# Patient Record
Sex: Female | Born: 1950
Health system: Southern US, Community
[De-identification: ages and names within clinical notes are randomized; demographics above are authoritative.]

## PROBLEM LIST (undated history)

## (undated) DIAGNOSIS — M1990 Unspecified osteoarthritis, unspecified site: Secondary | ICD-10-CM

## (undated) DIAGNOSIS — Z9889 Other specified postprocedural states: Secondary | ICD-10-CM

## (undated) DIAGNOSIS — R112 Nausea with vomiting, unspecified: Secondary | ICD-10-CM

## (undated) HISTORY — PX: BREAST MASS EXCISION: SHX1267

## (undated) HISTORY — PX: CARTILAGE SURGERY: SHX1303

---
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
## 1998-03-27 ENCOUNTER — Encounter: Admission: RE | Admit: 1998-03-27 | Discharge: 1998-03-27 | Payer: Self-pay | Admitting: Family Medicine

## 2000-03-30 ENCOUNTER — Encounter: Payer: Self-pay | Admitting: Cardiology

## 2000-03-30 ENCOUNTER — Ambulatory Visit (HOSPITAL_COMMUNITY): Admission: RE | Admit: 2000-03-30 | Discharge: 2000-03-30 | Payer: Self-pay | Admitting: Cardiology

## 2001-11-15 ENCOUNTER — Ambulatory Visit (HOSPITAL_COMMUNITY): Admission: RE | Admit: 2001-11-15 | Discharge: 2001-11-15 | Payer: Self-pay | Admitting: *Deleted

## 2002-11-17 ENCOUNTER — Ambulatory Visit (HOSPITAL_COMMUNITY): Admission: RE | Admit: 2002-11-17 | Discharge: 2002-11-17 | Payer: Self-pay | Admitting: Family Medicine

## 2003-06-08 ENCOUNTER — Encounter: Admission: RE | Admit: 2003-06-08 | Discharge: 2003-06-08 | Payer: Self-pay | Admitting: Obstetrics and Gynecology

## 2003-06-15 ENCOUNTER — Encounter: Admission: RE | Admit: 2003-06-15 | Discharge: 2003-06-15 | Payer: Self-pay | Admitting: Obstetrics and Gynecology

## 2004-03-04 ENCOUNTER — Ambulatory Visit (HOSPITAL_COMMUNITY): Admission: RE | Admit: 2004-03-04 | Discharge: 2004-03-04 | Payer: Self-pay | Admitting: Obstetrics & Gynecology

## 2005-03-24 ENCOUNTER — Ambulatory Visit (HOSPITAL_COMMUNITY): Admission: RE | Admit: 2005-03-24 | Discharge: 2005-03-24 | Payer: Self-pay | Admitting: Obstetrics and Gynecology

## 2006-04-06 ENCOUNTER — Ambulatory Visit (HOSPITAL_COMMUNITY): Admission: RE | Admit: 2006-04-06 | Discharge: 2006-04-06 | Payer: Self-pay | Admitting: Obstetrics & Gynecology

## 2006-07-30 ENCOUNTER — Ambulatory Visit: Payer: Self-pay | Admitting: Obstetrics and Gynecology

## 2007-04-07 ENCOUNTER — Ambulatory Visit (HOSPITAL_COMMUNITY): Admission: RE | Admit: 2007-04-07 | Discharge: 2007-04-07 | Payer: Self-pay

## 2008-04-07 ENCOUNTER — Ambulatory Visit (HOSPITAL_COMMUNITY): Admission: RE | Admit: 2008-04-07 | Discharge: 2008-04-07 | Payer: Self-pay | Admitting: Obstetrics & Gynecology

## 2010-05-24 NOTE — Group Therapy Note (Signed)
NAMEKYLEENA, SCHEIRER NO.:  [id number]   MEDICAL RECORD NO.:  [id number]                   PATIENT TYPE:  OUT   LOCATION:  [facility]                           FACILITY:  [facility]   PHYSICIAN:  Argentina Donovan, MD                     DATE OF BIRTH:  01/12/1950   DATE OF SERVICE:  06/08/2003                                    CLINIC NOTE   REASON FOR VISIT:  The patient is a 60 year old gravida 3 para 2-0-1-2 whose  youngest child is 67 years old and has had problems with uterine prolapse  and pelvic pressure for 15 years.  Over the last 9-12 months the symptoms  have become much worse with back pain and bilateral groin increasing  discomfort and also has caused some dyspareunia.  She comes in and on  examination she has a third degree procidentiae with third degree uterine  prolapse, cysto- and rectocele, and an inverted, funnel-shaped vagina which  a cube pessary was attempted to be used before without success.  In reducing  the prolapse, in pushing it up into the vagina, we could feel a clear area  around the uterus which I think may be benefited with a Gellhorn pessary so  we have tried to insert a 3-inch pessary and try and see if this will  control the patient's symptoms until she can arrange with her work to be  able to have the surgery.  Will also order her an inflatable cube pessary to  see whether that is more beneficial to her lifestyle.  The patient needs a  vaginal hysterectomy with an anterior and posterior colporrhaphy and a  sacral spinous ligament suspension of the vaginal vault at the minium.  She  is a slender lady, weighing 127 pounds and is 5 feet 5 inches tall.  Otherwise, seems to be in good health.   DIAGNOSIS:  Complete pelvic procidentiae and addition of Gellhorn 3-inch  pessary.                                               Argentina Donovan, MD    PR/MEDQ  D:  06/08/2003  T:  06/09/2003  Job:  [id number]

## 2010-05-24 NOTE — Group Therapy Note (Signed)
NAMESAPHYRA, HUTT NO.:  [id number]   MEDICAL RECORD NO.:  [id number]                   PATIENT TYPE:  OUT   LOCATION:  [facility]                           FACILITY:  [facility]   PHYSICIAN:  Argentina Donovan, MD                     DATE OF BIRTH:  06-09-1950   DATE OF SERVICE:  06/16/2003                                    CLINIC NOTE   REASON FOR VISIT:  A 60 year old postmenopausal white female gravida 3, para  2-0-1-2 who has a complete procidentia with a uterine prolapse and  cystorectocele.  We used an 3 inch pessary a Gellhorn which worked  Agricultural consultant at holding everything up but was somewhat difficult to remove.  I have instructed her husband in its use and have given her a 2-3/4 inch  Gellhorn pessary.  We tried to consider the inflatable; however, they do not  make that in anything but LATEX and the lady is LATEX SENSITIVE.  We have  given her warning signs of discharge that was abnormal, etc., to see whether  or not she needed to have anything further done.  In view of future surgery,  we will wait and see how the patient does on the pessary.                                               Argentina Donovan, MD    PR/MEDQ  D:  06/15/2003  T:  06/16/2003  Job:  [id number]

## 2012-06-04 ENCOUNTER — Other Ambulatory Visit (HOSPITAL_COMMUNITY): Payer: Self-pay | Admitting: Orthopaedic Surgery

## 2012-06-14 ENCOUNTER — Encounter (HOSPITAL_COMMUNITY): Payer: Self-pay | Admitting: Pharmacy Technician

## 2012-06-15 ENCOUNTER — Encounter (HOSPITAL_COMMUNITY): Payer: Self-pay

## 2012-06-15 ENCOUNTER — Encounter (HOSPITAL_COMMUNITY)
Admission: RE | Admit: 2012-06-15 | Discharge: 2012-06-15 | Disposition: A | Discharge status: Home / Self Care | Payer: BC Managed Care – PPO | Source: Ambulatory Visit | Attending: Orthopaedic Surgery | Admitting: Orthopaedic Surgery

## 2012-06-15 HISTORY — DX: Unspecified osteoarthritis, unspecified site: M19.90

## 2012-06-15 LAB — CBC
MCH: 31.1 pg (ref 26.0–34.0)
MCHC: 35 g/dL (ref 30.0–36.0)
MCV: 88.9 fL (ref 78.0–100.0)
Platelets: 256 10*3/uL (ref 150–400)
RBC: 4.5 MIL/uL (ref 3.87–5.11)

## 2012-06-15 LAB — BASIC METABOLIC PANEL
BUN: 15 mg/dL (ref 6–23)
CO2: 26 mEq/L (ref 19–32)
Calcium: 8.9 mg/dL (ref 8.4–10.5)
Creatinine, Ser: 0.69 mg/dL (ref 0.50–1.10)
GFR calc non Af Amer: >90 mL/min (ref >90)
Glucose, Bld: 96 mg/dL (ref 70–99)
Sodium: 139 mEq/L (ref 135–145)

## 2012-06-15 LAB — ABO/RH: ABO/RH(D): B POS

## 2012-06-15 NOTE — Pre-Procedure Instructions (Addendum)
Tara Fernandez  06/15/2012   Your procedure is scheduled on: Thursday, June 17, 2012  Report to [facility] at 8:45 AM.  Call this number if you have problems the morning of surgery: [phone]   Remember:   Do not eat food or drink liquids after midnight.   Take these medicines the morning of surgery with A SIP OF WATER: traMADol (ULTRAM) 50 MG tablet Stop taking Aspirin  and herbal medications (EVENING PRIMROSE OIL, Glucosamine HCl (GLUCOSAMINE PO) Do not take any NSAIDs ie: Ibuprofen, Advil, Naproxen or any medication containing Aspirin (meloxicam (MOBIC) 7.5 MG tablet)             Do not wear jewelry, make-up or nail polish.  Do not wear lotions, powders, or perfumes. You may wear deodorant.  Do not shave 48 hours prior to surgery. Men may shave face and neck.  Do not bring valuables to the hospital.  [facility] is not responsible   for any belongings or valuables.  Contacts, dentures or bridgework may not be worn into surgery.  Leave suitcase in the car. After surgery it may be brought to your room.  For patients admitted to the hospital, checkout time is 11:00 AM the day of discharge.   Patients discharged the day of surgery will not be allowed to drive home.  Name and phone number of your driver:   Special Instructions: Shower using CHG 2 nights before surgery and the night before surgery.  If you shower the day of surgery use CHG.  Use special wash - you have one bottle of CHG for all showers.  You should use approximately 1/3 of the bottle for each shower.   Please read over the following fact sheets that you were given: Pain Booklet, Coughing and Deep Breathing, Blood Transfusion Information, MRSA Information and Surgical Site Infection Prevention

## 2012-06-15 NOTE — Progress Notes (Addendum)
Pt denies SOB, chest pain, and being under the care of a cardiologist. Pt states that she wants the anesthesiologist to be aware that she does not want a Spinal. After several unsuccessful attempts, pt unable to void; need urinalysis with reflex on DOS.

## 2012-06-16 MED ORDER — CEFAZOLIN SODIUM-DEXTROSE 2-3 GM-% IV SOLR
2.0000 g | INTRAVENOUS | Status: AC
  Administered 2012-06-17: 2 g via INTRAVENOUS
  Filled 2012-06-16: qty 50

## 2012-06-17 ENCOUNTER — Encounter (HOSPITAL_COMMUNITY): Payer: Self-pay | Admitting: *Deleted

## 2012-06-17 ENCOUNTER — Inpatient Hospital Stay (HOSPITAL_COMMUNITY): Payer: BC Managed Care – PPO

## 2012-06-17 ENCOUNTER — Inpatient Hospital Stay (HOSPITAL_COMMUNITY)
Admission: RE | Admit: 2012-06-17 | Discharge: 2012-06-20 | DRG: 818 | Disposition: A | Discharge status: Home Health | Payer: BC Managed Care – PPO | Source: Ambulatory Visit | Attending: Orthopaedic Surgery | Admitting: Orthopaedic Surgery

## 2012-06-17 ENCOUNTER — Ambulatory Visit (HOSPITAL_COMMUNITY): Payer: BC Managed Care – PPO | Admitting: *Deleted

## 2012-06-17 ENCOUNTER — Ambulatory Visit (HOSPITAL_COMMUNITY): Payer: BC Managed Care – PPO

## 2012-06-17 ENCOUNTER — Encounter (HOSPITAL_COMMUNITY)
Admission: RE | Disposition: A | Discharge status: Home Health | Payer: Self-pay | Source: Ambulatory Visit | Attending: Orthopaedic Surgery

## 2012-06-17 DIAGNOSIS — M169 Osteoarthritis of hip, unspecified: Secondary | ICD-10-CM

## 2012-06-17 DIAGNOSIS — M1610 Unilateral primary osteoarthritis, unspecified hip: Principal | ICD-10-CM | POA: Diagnosis present

## 2012-06-17 DIAGNOSIS — Z79899 Other long term (current) drug therapy: Secondary | ICD-10-CM

## 2012-06-17 HISTORY — PX: TOTAL HIP ARTHROPLASTY: SHX124

## 2012-06-17 LAB — URINALYSIS, ROUTINE W REFLEX MICROSCOPIC
Bilirubin Urine: NEGATIVE
Glucose, UA: NEGATIVE mg/dL
Nitrite: NEGATIVE
Specific Gravity, Urine: 1.015 (ref 1.005–1.030)
pH: 5.5 (ref 5.0–8.0)

## 2012-06-17 LAB — URINE MICROSCOPIC-ADD ON

## 2012-06-17 SURGERY — ARTHROPLASTY, HIP, TOTAL, ANTERIOR APPROACH
Anesthesia: General | Site: Hip | Laterality: Left | Wound class: Clean

## 2012-06-17 MED ORDER — ARTIFICIAL TEARS OP OINT
TOPICAL_OINTMENT | OPHTHALMIC | Status: DC | PRN
  Administered 2012-06-17: 1 via OPHTHALMIC

## 2012-06-17 MED ORDER — ONDANSETRON HCL 4 MG/2ML IJ SOLN
INTRAMUSCULAR | Status: AC
  Filled 2012-06-17: qty 2

## 2012-06-17 MED ORDER — HYDROMORPHONE HCL PF 1 MG/ML IJ SOLN
INTRAMUSCULAR | Status: AC
  Filled 2012-06-17: qty 2

## 2012-06-17 MED ORDER — MENTHOL 3 MG MT LOZG
1.0000 | LOZENGE | OROMUCOSAL | Status: DC | PRN
  Filled 2012-06-17 (×2): qty 9

## 2012-06-17 MED ORDER — ONDANSETRON HCL 4 MG/2ML IJ SOLN
4.0000 mg | Freq: Once | INTRAMUSCULAR | Status: AC | PRN
  Administered 2012-06-17: 4 mg via INTRAVENOUS

## 2012-06-17 MED ORDER — CEFAZOLIN SODIUM 1-5 GM-% IV SOLN
1.0000 g | Freq: Four times a day (QID) | INTRAVENOUS | Status: AC
  Administered 2012-06-17 (×2): 1 g via INTRAVENOUS
  Filled 2012-06-17 (×2): qty 50

## 2012-06-17 MED ORDER — LACTATED RINGERS IV SOLN
INTRAVENOUS | Status: DC
  Administered 2012-06-17: 50 mL/h via INTRAVENOUS

## 2012-06-17 MED ORDER — ONDANSETRON HCL 4 MG PO TABS: 4.0000 mg | ORAL_TABLET | Freq: Four times a day (QID) | ORAL | Status: DC | PRN

## 2012-06-17 MED ORDER — ACETAMINOPHEN 325 MG PO TABS
650.0000 mg | ORAL_TABLET | Freq: Four times a day (QID) | ORAL | Status: DC | PRN
  Administered 2012-06-19: 650 mg via ORAL
  Filled 2012-06-17 (×2): qty 2

## 2012-06-17 MED ORDER — ACETAMINOPHEN 650 MG RE SUPP: 650.0000 mg | Freq: Four times a day (QID) | RECTAL | Status: DC | PRN

## 2012-06-17 MED ORDER — EPHEDRINE SULFATE 50 MG/ML IJ SOLN
INTRAMUSCULAR | Status: DC | PRN
  Administered 2012-06-17: 5 mg via INTRAVENOUS

## 2012-06-17 MED ORDER — HYDROMORPHONE HCL PF 1 MG/ML IJ SOLN
1.0000 mg | INTRAMUSCULAR | Status: DC | PRN
  Administered 2012-06-17 – 2012-06-19 (×5): 1 mg via INTRAVENOUS
  Filled 2012-06-17 (×6): qty 1

## 2012-06-17 MED ORDER — FENTANYL CITRATE 0.05 MG/ML IJ SOLN
INTRAMUSCULAR | Status: DC | PRN
  Administered 2012-06-17: 50 ug via INTRAVENOUS
  Administered 2012-06-17: 100 ug via INTRAVENOUS
  Administered 2012-06-17 (×2): 50 ug via INTRAVENOUS
  Administered 2012-06-17: 150 ug via INTRAVENOUS
  Administered 2012-06-17 (×2): 50 ug via INTRAVENOUS

## 2012-06-17 MED ORDER — PROPOFOL 10 MG/ML IV BOLUS
INTRAVENOUS | Status: DC | PRN
  Administered 2012-06-17: 120 mg via INTRAVENOUS

## 2012-06-17 MED ORDER — SODIUM CHLORIDE 0.9 % IR SOLN
Status: DC | PRN
  Administered 2012-06-17: 3000 mL

## 2012-06-17 MED ORDER — STERILE WATER FOR IRRIGATION IR SOLN
Status: DC | PRN
  Administered 2012-06-17: 1000 mL

## 2012-06-17 MED ORDER — BISACODYL 5 MG PO TBEC
5.0000 mg | DELAYED_RELEASE_TABLET | Freq: Every day | ORAL | Status: DC | PRN
  Administered 2012-06-19 – 2012-06-20 (×2): 5 mg via ORAL
  Filled 2012-06-17 (×2): qty 1

## 2012-06-17 MED ORDER — ALUM & MAG HYDROXIDE-SIMETH 200-200-20 MG/5ML PO SUSP: 30.0000 mL | ORAL | Status: DC | PRN

## 2012-06-17 MED ORDER — DOCUSATE SODIUM 100 MG PO CAPS
100.0000 mg | ORAL_CAPSULE | Freq: Two times a day (BID) | ORAL | Status: DC
  Administered 2012-06-17 – 2012-06-20 (×6): 100 mg via ORAL
  Filled 2012-06-17 (×8): qty 1

## 2012-06-17 MED ORDER — METOCLOPRAMIDE HCL 5 MG/ML IJ SOLN
5.0000 mg | Freq: Three times a day (TID) | INTRAMUSCULAR | Status: DC | PRN
  Administered 2012-06-17: 10 mg via INTRAVENOUS
  Filled 2012-06-17: qty 2

## 2012-06-17 MED ORDER — OXYCODONE HCL 5 MG PO TABS
5.0000 mg | ORAL_TABLET | ORAL | Status: DC | PRN
  Administered 2012-06-17: 5 mg via ORAL
  Administered 2012-06-17 – 2012-06-20 (×17): 10 mg via ORAL
  Filled 2012-06-17 (×17): qty 2
  Filled 2012-06-17: qty 1

## 2012-06-17 MED ORDER — PHENYLEPHRINE HCL 10 MG/ML IJ SOLN
INTRAMUSCULAR | Status: DC | PRN
  Administered 2012-06-17: 40 ug via INTRAVENOUS
  Administered 2012-06-17 (×3): 80 ug via INTRAVENOUS

## 2012-06-17 MED ORDER — ROCURONIUM BROMIDE 100 MG/10ML IV SOLN
INTRAVENOUS | Status: DC | PRN
  Administered 2012-06-17: 50 mg via INTRAVENOUS

## 2012-06-17 MED ORDER — NEOSTIGMINE METHYLSULFATE 1 MG/ML IJ SOLN
INTRAMUSCULAR | Status: DC | PRN
  Administered 2012-06-17: 3 mg via INTRAVENOUS

## 2012-06-17 MED ORDER — ZOLPIDEM TARTRATE 5 MG PO TABS: 5.0000 mg | ORAL_TABLET | Freq: Every evening | ORAL | Status: DC | PRN

## 2012-06-17 MED ORDER — METOCLOPRAMIDE HCL 10 MG PO TABS: 5.0000 mg | ORAL_TABLET | Freq: Three times a day (TID) | ORAL | Status: DC | PRN

## 2012-06-17 MED ORDER — PHENOL 1.4 % MT LIQD
1.0000 | OROMUCOSAL | Status: DC | PRN
  Administered 2012-06-18: 1 via OROMUCOSAL
  Filled 2012-06-17: qty 177

## 2012-06-17 MED ORDER — HYDROMORPHONE HCL PF 1 MG/ML IJ SOLN
0.2500 mg | INTRAMUSCULAR | Status: DC | PRN
  Administered 2012-06-17 (×6): 0.5 mg via INTRAVENOUS

## 2012-06-17 MED ORDER — LIDOCAINE HCL (CARDIAC) 20 MG/ML IV SOLN
INTRAVENOUS | Status: DC | PRN
  Administered 2012-06-17: 100 mg via INTRAVENOUS

## 2012-06-17 MED ORDER — ONDANSETRON HCL 4 MG/2ML IJ SOLN
INTRAMUSCULAR | Status: DC | PRN
  Administered 2012-06-17: 4 mg via INTRAVENOUS

## 2012-06-17 MED ORDER — GLYCOPYRROLATE 0.2 MG/ML IJ SOLN
INTRAMUSCULAR | Status: DC | PRN
  Administered 2012-06-17: .4 mg via INTRAVENOUS

## 2012-06-17 MED ORDER — MEPERIDINE HCL 25 MG/ML IJ SOLN: 6.2500 mg | INTRAMUSCULAR | Status: DC | PRN

## 2012-06-17 MED ORDER — SODIUM CHLORIDE 0.9 % IV SOLN
INTRAVENOUS | Status: DC
  Administered 2012-06-17 – 2012-06-19 (×2): via INTRAVENOUS

## 2012-06-17 MED ORDER — HYDROMORPHONE HCL PF 1 MG/ML IJ SOLN
INTRAMUSCULAR | Status: AC
  Filled 2012-06-17: qty 1

## 2012-06-17 MED ORDER — 0.9 % SODIUM CHLORIDE (POUR BTL) OPTIME
TOPICAL | Status: DC | PRN
  Administered 2012-06-17: 1000 mL

## 2012-06-17 MED ORDER — OXYCODONE HCL 5 MG/5ML PO SOLN: 5.0000 mg | Freq: Once | ORAL | Status: DC | PRN

## 2012-06-17 MED ORDER — METHOCARBAMOL 100 MG/ML IJ SOLN
500.0000 mg | Freq: Four times a day (QID) | INTRAVENOUS | Status: DC | PRN
  Filled 2012-06-17: qty 5

## 2012-06-17 MED ORDER — METHOCARBAMOL 500 MG PO TABS
500.0000 mg | ORAL_TABLET | Freq: Four times a day (QID) | ORAL | Status: DC | PRN
  Administered 2012-06-18 – 2012-06-20 (×9): 500 mg via ORAL
  Filled 2012-06-17 (×9): qty 1

## 2012-06-17 MED ORDER — LACTATED RINGERS IV SOLN
INTRAVENOUS | Status: DC | PRN
  Administered 2012-06-17 (×2): via INTRAVENOUS

## 2012-06-17 MED ORDER — DEXAMETHASONE SODIUM PHOSPHATE 4 MG/ML IJ SOLN
INTRAMUSCULAR | Status: DC | PRN
  Administered 2012-06-17: 4 mg via INTRAVENOUS

## 2012-06-17 MED ORDER — RIVAROXABAN 10 MG PO TABS
10.0000 mg | ORAL_TABLET | Freq: Every day | ORAL | Status: DC
  Administered 2012-06-18 – 2012-06-20 (×3): 10 mg via ORAL
  Filled 2012-06-17 (×5): qty 1

## 2012-06-17 MED ORDER — OXYCODONE HCL 5 MG PO TABS: 5.0000 mg | ORAL_TABLET | Freq: Once | ORAL | Status: DC | PRN

## 2012-06-17 MED ORDER — MIDAZOLAM HCL 5 MG/5ML IJ SOLN
INTRAMUSCULAR | Status: DC | PRN
  Administered 2012-06-17: 2 mg via INTRAVENOUS

## 2012-06-17 MED ORDER — POLYETHYLENE GLYCOL 3350 17 G PO PACK: 17.0000 g | PACK | Freq: Every day | ORAL | Status: DC | PRN

## 2012-06-17 MED ORDER — ONDANSETRON HCL 4 MG/2ML IJ SOLN
4.0000 mg | Freq: Four times a day (QID) | INTRAMUSCULAR | Status: DC | PRN
  Administered 2012-06-17: 4 mg via INTRAVENOUS
  Filled 2012-06-17: qty 2

## 2012-06-17 MED ORDER — DIPHENHYDRAMINE HCL 12.5 MG/5ML PO ELIX: 12.5000 mg | ORAL_SOLUTION | ORAL | Status: DC | PRN

## 2012-06-17 SURGICAL SUPPLY — 59 items
BANDAGE GAUZE ELAST BULKY 4 IN (GAUZE/BANDAGES/DRESSINGS) IMPLANT
BLADE SAW SGTL 18X1.27X75 (BLADE) ×2 IMPLANT
BLADE SURG ROTATE 9660 (MISCELLANEOUS) IMPLANT
CAPT HIP PF COP ×2 IMPLANT
CELLS DAT CNTRL 66122 CELL SVR (MISCELLANEOUS) ×1 IMPLANT
CLOTH BEACON ORANGE TIMEOUT ST (SAFETY) ×2 IMPLANT
COVER BACK TABLE 24X17X13 BIG (DRAPES) IMPLANT
COVER SURGICAL LIGHT HANDLE (MISCELLANEOUS) ×2 IMPLANT
DERMABOND ADHESIVE PROPEN (GAUZE/BANDAGES/DRESSINGS) ×1
DERMABOND ADVANCED (GAUZE/BANDAGES/DRESSINGS) ×1
DERMABOND ADVANCED .7 DNX12 (GAUZE/BANDAGES/DRESSINGS) ×1 IMPLANT
DERMABOND ADVANCED .7 DNX6 (GAUZE/BANDAGES/DRESSINGS) ×1 IMPLANT
DRAPE C-ARM 42X72 X-RAY (DRAPES) ×2 IMPLANT
DRAPE STERI IOBAN 125X83 (DRAPES) ×2 IMPLANT
DRAPE U-SHAPE 47X51 STRL (DRAPES) ×6 IMPLANT
DRSG AQUACEL AG ADV 3.5X10 (GAUZE/BANDAGES/DRESSINGS) ×2 IMPLANT
DURAPREP 26ML APPLICATOR (WOUND CARE) ×2 IMPLANT
ELECT BLADE 4.0 EZ CLEAN MEGAD (MISCELLANEOUS)
ELECT BLADE TIP CTD 4 INCH (ELECTRODE) ×2 IMPLANT
ELECT CAUTERY BLADE 6.4 (BLADE) ×2 IMPLANT
ELECT REM PT RETURN 9FT ADLT (ELECTROSURGICAL) ×2
ELECTRODE BLDE 4.0 EZ CLN MEGD (MISCELLANEOUS) IMPLANT
ELECTRODE REM PT RTRN 9FT ADLT (ELECTROSURGICAL) ×1 IMPLANT
FACESHIELD LNG OPTICON STERILE (SAFETY) ×4 IMPLANT
FLUID NSS /IRRIG 3000 ML XXX (IV SOLUTION) ×2 IMPLANT
GAUZE XEROFORM 1X8 LF (GAUZE/BANDAGES/DRESSINGS) ×2 IMPLANT
GLOVE BIOGEL PI IND STRL 8 (GLOVE) ×2 IMPLANT
GLOVE BIOGEL PI INDICATOR 8 (GLOVE) ×2
GLOVE ECLIPSE 8.0 STRL XLNG CF (GLOVE) ×2 IMPLANT
GLOVE SURG ORTHO 8.0 STRL STRW (GLOVE) ×2 IMPLANT
GLOVE SURG SS PI 7.5 STRL IVOR (GLOVE) ×2 IMPLANT
GLOVE SURG SS PI 8.0 STRL IVOR (GLOVE) ×2 IMPLANT
GOWN STRL REIN XL XLG (GOWN DISPOSABLE) ×4 IMPLANT
HANDPIECE INTERPULSE COAX TIP (DISPOSABLE) ×1
KIT BASIN OR (CUSTOM PROCEDURE TRAY) ×2 IMPLANT
KIT ROOM TURNOVER OR (KITS) ×2 IMPLANT
MANIFOLD NEPTUNE II (INSTRUMENTS) ×2 IMPLANT
NS IRRIG 1000ML POUR BTL (IV SOLUTION) ×2 IMPLANT
PACK TOTAL JOINT (CUSTOM PROCEDURE TRAY) ×2 IMPLANT
PAD ARMBOARD 7.5X6 YLW CONV (MISCELLANEOUS) ×4 IMPLANT
RTRCTR WOUND ALEXIS 18CM MED (MISCELLANEOUS) ×2
SET HNDPC FAN SPRY TIP SCT (DISPOSABLE) ×1 IMPLANT
SPONGE LAP 18X18 X RAY DECT (DISPOSABLE) IMPLANT
SPONGE LAP 4X18 X RAY DECT (DISPOSABLE) IMPLANT
STAPLER VISISTAT 35W (STAPLE) ×2 IMPLANT
SUT ETHIBOND NAB CT1 #1 30IN (SUTURE) ×4 IMPLANT
SUT MNCRL AB 3-0 PS2 27 (SUTURE) ×2 IMPLANT
SUT MNCRL AB 4-0 PS2 18 (SUTURE) ×2 IMPLANT
SUT VIC AB 0 CT1 27 (SUTURE) ×2
SUT VIC AB 0 CT1 27XBRD ANBCTR (SUTURE) ×2 IMPLANT
SUT VIC AB 1 CT1 27 (SUTURE) ×2
SUT VIC AB 1 CT1 27XBRD ANBCTR (SUTURE) ×2 IMPLANT
SUT VIC AB 2-0 CT1 27 (SUTURE) ×2
SUT VIC AB 2-0 CT1 TAPERPNT 27 (SUTURE) ×2 IMPLANT
TOWEL OR 17X24 6PK STRL BLUE (TOWEL DISPOSABLE) ×2 IMPLANT
TOWEL OR 17X26 10 PK STRL BLUE (TOWEL DISPOSABLE) ×2 IMPLANT
TRAY FOLEY BAG SILVER LF 14FR (CATHETERS) ×2 IMPLANT
TRAY FOLEY CATH 14FR (SET/KITS/TRAYS/PACK) IMPLANT
WATER STERILE IRR 1000ML POUR (IV SOLUTION) ×2 IMPLANT

## 2012-06-17 NOTE — H&P (Signed)
TOTAL HIP ADMISSION H&P  Patient is admitted for left total hip arthroplasty.  Subjective:  Chief Complaint: left hip pain  HPI: Tara Fernandez, 62 y.o. female, has a history of pain and functional disability in the left hip(s) due to arthritis and patient has failed non-surgical conservative treatments for greater than 12 weeks to include NSAID's and/or analgesics, corticosteriod injections, use of assistive devices and activity modification.  Onset of symptoms was gradual starting 2 years ago with rapidlly worsening course since that time.The patient noted no past surgery on the left hip(s).  Patient currently rates pain in the left hip at 8 out of 10 with activity. Patient has night pain, worsening of pain with activity and weight bearing, trendelenberg gait, pain that interfers with activities of daily living, pain with passive range of motion and crepitus. Patient has evidence of subchondral cysts, subchondral sclerosis, periarticular osteophytes and joint space narrowing by imaging studies. This condition presents safety issues increasing the risk of falls.  There is no current active infection.  Patient Active Problem List   Diagnosis Date Noted  . Degenerative arthritis of hip 06/17/2012   Past Medical History  Diagnosis Date  . Arthritis     osteoarthritis    Past Surgical History  Procedure Laterality Date  . Breast mass excision      Hx: of left breast    Prescriptions prior to admission  Medication Sig Dispense Refill  . Cholecalciferol (VITAMIN D PO) Take 10,000 Units by mouth daily.      Marland Kitchen EVENING PRIMROSE OIL PO Take 1,300 mg by mouth daily.      . Glucosamine HCl (GLUCOSAMINE PO) Take 1 tablet by mouth daily.      Marland Kitchen MAGNESIUM PO Take 1 tablet by mouth daily.      . meloxicam (MOBIC) 7.5 MG tablet Take 7.5 mg by mouth 2 (two) times daily as needed for pain.      . Nutritional Supplements (DHEA PO) Take 1 tablet by mouth daily.      Marland Kitchen OVER THE COUNTER MEDICATION Take 1  tablet by mouth daily. Med: positive energy      . OVER THE COUNTER MEDICATION Apply 1 application topically every other day. Over the counter progesterone cream      . Potassium 99 MG TABS Take 99 mg by mouth daily.       Allergies  Allergen Reactions  . Tramadol Other (See Comments)    Causes numbness in hands and right side of body  . Etodolac Other (See Comments)    "it bothered my heart and made my left arm go numb."    History  Substance Use Topics  . Smoking status: Never Smoker   . Smokeless tobacco: Never Used  . Alcohol Use: Not on file     Comment: occasional    Family History  Problem Relation Age of Onset  . Heart attack Father   . Cancer - Lung Sister      Review of Systems  Musculoskeletal: Positive for joint pain.  All other systems reviewed and are negative.    Objective:  Physical Exam  Constitutional: She is oriented to person, place, and time. She appears well-developed and well-nourished.  HENT:  Head: Normocephalic and atraumatic.  Eyes: EOM are normal. Pupils are equal, round, and reactive to light.  Neck: Normal range of motion. Neck supple.  Cardiovascular: Normal rate and regular rhythm.   Respiratory: Effort normal and breath sounds normal.  GI: Soft. Bowel sounds are  normal.  Musculoskeletal:       Left hip: She exhibits decreased range of motion, decreased strength, bony tenderness and crepitus.  Neurological: She is alert and oriented to person, place, and time.  Skin: Skin is warm and dry.  Psychiatric: She has a normal mood and affect.    Vital signs in last 24 hours: Temp:  [98.2 F (36.8 C)] 98.2 F (36.8 C) (06/12 0756) Pulse Rate:  [70] 70 (06/12 0756) Resp:  [16] 16 (06/12 0756) BP: (103)/(61) 103/61 mmHg (06/12 0756) SpO2:  [99 %] 99 % (06/12 0756)  Labs:   There is no weight on file to calculate BMI.   Imaging Review Plain radiographs demonstrate severe degenerative joint disease of the left hip(s). The bone  quality appears to be good for age and reported activity level.  Assessment/Plan:  End stage arthritis, left hip(s)  The patient history, physical examination, clinical judgement of the provider and imaging studies are consistent with end stage degenerative joint disease of the left hip(s) and total hip arthroplasty is deemed medically necessary. The treatment options including medical management, injection therapy, arthroscopy and arthroplasty were discussed at length. The risks and benefits of total hip arthroplasty were presented and reviewed. The risks due to aseptic loosening, infection, stiffness, dislocation/subluxation,  thromboembolic complications and other imponderables were discussed.  The patient acknowledged the explanation, agreed to proceed with the plan and consent was signed. Patient is being admitted for inpatient treatment for surgery, pain control, PT, OT, prophylactic antibiotics, VTE prophylaxis, progressive ambulation and ADL's and discharge planning.The patient is planning to be discharged home with home health services

## 2012-06-17 NOTE — Anesthesia Preprocedure Evaluation (Signed)
Anesthesia Evaluation  Patient identified by MRN, date of birth, ID band Patient awake    Reviewed: Allergy & Precautions, H&P , NPO status , Patient's Chart, lab work & pertinent test results  Airway Mallampati: I TM Distance: >3 FB Neck ROM: Full    Dental   Pulmonary          Cardiovascular     Neuro/Psych    GI/Hepatic   Endo/Other    Renal/GU      Musculoskeletal   Abdominal   Peds  Hematology   Anesthesia Other Findings   Reproductive/Obstetrics                           Anesthesia Physical Anesthesia Plan  ASA: II  Anesthesia Plan: General   Post-op Pain Management:    Induction: Intravenous  Airway Management Planned: Oral ETT  Additional Equipment:   Intra-op Plan:   Post-operative Plan: Extubation in OR  Informed Consent: I have reviewed the patients History and Physical, chart, labs and discussed the procedure including the risks, benefits and alternatives for the proposed anesthesia with the patient or authorized representative who has indicated his/her understanding and acceptance.     Plan Discussed with: CRNA and Surgeon  Anesthesia Plan Comments:         Anesthesia Quick Evaluation  

## 2012-06-17 NOTE — Brief Op Note (Signed)
06/17/2012  11:31 AM  PATIENT:  Mittie Bodo  62 y.o. female  PRE-OPERATIVE DIAGNOSIS:  Severe osteoarthritis left hip  POST-OPERATIVE DIAGNOSIS:  severe osteoarthritis left hip  PROCEDURE:  Procedure(s): LEFT TOTAL HIP ARTHROPLASTY ANTERIOR APPROACH (Left)  SURGEON:  Surgeon(s) and Role:    * Kathryne Hitch, MD - Primary  PHYSICIAN ASSISTANT: Rexene Edison, PA-C  ANESTHESIA:   general  EBL:  Total I/O In: -  Out: 200 [Urine:100; Blood:100]  BLOOD ADMINISTERED:none  DRAINS: none   LOCAL MEDICATIONS USED:  NONE  SPECIMEN:  No Specimen  DISPOSITION OF SPECIMEN:  N/A  COUNTS:  YES  TOURNIQUET:  * No tourniquets in log *  DICTATION: .Other Dictation: Dictation Number [id number]  PLAN OF CARE: Admit to inpatient   PATIENT DISPOSITION:  PACU - hemodynamically stable.   Delay start of Pharmacological VTE agent (>24hrs) due to surgical blood loss or risk of bleeding: no

## 2012-06-17 NOTE — Progress Notes (Signed)
Pt states pain is 7/10.  Pt is sleepy, no overt signs of pain.  Dr. Michelle Piper paged.  Will come to bedside to evaluate patient

## 2012-06-17 NOTE — Transfer of Care (Signed)
Immediate Anesthesia Transfer of Care Note  Patient: Tara Fernandez  Procedure(s) Performed: Procedure(s): LEFT TOTAL HIP ARTHROPLASTY ANTERIOR APPROACH (Left)  Patient Location: PACU  Anesthesia Type:General  Level of Consciousness: awake, alert , oriented and patient cooperative  Airway & Oxygen Therapy: Patient Spontanous Breathing and Patient connected to nasal cannula oxygen  Post-op Assessment: Report given to PACU RN, Post -op Vital signs reviewed and stable and Patient moving all extremities X 4  Post vital signs: Reviewed and stable  Complications: No apparent anesthesia complications

## 2012-06-17 NOTE — Anesthesia Postprocedure Evaluation (Signed)
Anesthesia Post Note  Patient: Tara Fernandez  Procedure(s) Performed: Procedure(s) (LRB): LEFT TOTAL HIP ARTHROPLASTY ANTERIOR APPROACH (Left)  Anesthesia type: general  Patient location: PACU  Post pain: Pain level controlled  Post assessment: Patient's Cardiovascular Status Stable  Last Vitals:  Filed Vitals:   06/17/12 1300  BP:   Pulse: 66  Temp:   Resp: 29    Post vital signs: Reviewed and stable  Level of consciousness: sedated  Complications: No apparent anesthesia complications

## 2012-06-17 NOTE — Progress Notes (Signed)
Orthopedic Tech Progress Note Patient Details:  Tara Fernandez [birth year] [id number] OHF applied to bed    Patient ID: Tara Fernandez, female   DOB: 1950/08/26, 62 y.o.   MRN: [id number]   Orie Rout 06/17/2012, 2:26 PM

## 2012-06-17 NOTE — Progress Notes (Signed)
Patient c/o her left hand feeling colder than the other hand.  I assessed her left hand that has a an iv and the iv is working properly, fluids going into her vein.  I explained that her hand may feel cooler because of her iv fluids going into that hand.  Also her foot pumps are beeping, after several adjustments, I ordered new foot pumps.

## 2012-06-18 LAB — BASIC METABOLIC PANEL
BUN: 9 mg/dL (ref 6–23)
Chloride: 102 mEq/L (ref 96–112)
Creatinine, Ser: 0.6 mg/dL (ref 0.50–1.10)
GFR calc Af Amer: >90 mL/min (ref >90)

## 2012-06-18 LAB — CBC
HCT: 32.2 % — ABNORMAL LOW (ref 36.0–46.0)
MCV: 88.7 fL (ref 78.0–100.0)
RDW: 12.7 % (ref 11.5–15.5)
WBC: 9.6 10*3/uL (ref 4.0–10.5)

## 2012-06-18 NOTE — Op Note (Signed)
Tara Fernandez, Tara Fernandez NO.:  [id number]  MEDICAL RECORD NO.:  [id number]  LOCATION:  [id number]                        FACILITY:  [facility]  PHYSICIAN:  Vanita Panda. Magnus Ivan, M.D.DATE OF BIRTH:  [birth year]  DATE OF PROCEDURE:  06/17/2012 DATE OF DISCHARGE:                              OPERATIVE REPORT   PREOPERATIVE DIAGNOSIS:  Severe end-stage arthritis and degenerative joint disease, left hip.  POSTOPERATIVE DIAGNOSIS:  Severe end-stage arthritis and degenerative joint disease, left hip.  PROCEDURE:  Left total hip arthroplasty through direct anterior approach.  IMPLANTS:  DePuy Sector Gription acetabular component size 50, apex hole eliminator guide, size 32+ 4 neutral polyethylene liner, Corail femoral component size 10 with standard offset (KA), size 32+ 1 ceramic hip ball.  SURGEON:  Doneen Poisson, MD.  ASSISTANT:  Richardean Canal, PA-C  ANESTHESIA:  General.  ANTIBIOTICS:  Ancef 2 g IV.  ESTIMATED BLOOD LOSS:  100-150 mL.  COMPLICATIONS:  None.  INDICATIONS:  Ms.  Fernandez is a 62 year old female with severe debilitating end-stage arthritis of her left hip.  She has x-rays that showed complete loss of her left hip joint space, periarticular osteophytes, subchondral sclerosis, and cystic changes.  She has had steroid injection in her hip as well and this has failed to provide any relief.  Her activities of daily living are severely limited.  Her mobility is limited.  Her pain is daily.  At this point, she wished to proceed with a total hip arthroplasty, and she wants this done through a direct anterior approach.  Risks and benefits have been discussed with her in detail including the risk of acute blood loss anemia nerve and vessel injury, fracture infection, and DVT.  The goals are improved mobility, decreased pain, and improved quality of life.  DESCRIPTION OF PROCEDURE:  After informed consent was obtained, appropriate left hip was marked.   She was brought to the operating room and general anesthesia was obtained while she was on her stretcher.  A Foley catheter was placed and then traction boots were placed on her feet so she could next be placed supine on the Hana fracture table and perineal post in place.  Both legs in inline skeletal traction devices, but no traction applied.  Her left operative hip was then prepped and draped with DuraPrep and sterile drapes.  A time-out was called.  She was identified as correct patient, correct left hip.  I then made an incision just inferior and posterior to the anterosuperior iliac spine and carried this obliquely down the leg.  I dissected down to the tensor fascia lata muscle and then the tensor fascia was divided longitudinally.  I proceeded with a direct anterior approach to the hip at that standpoint.  A cobra retractors was placed around the lateral neck and up underneath the rectus femoris, a medial cobra retractor was placed.  I cauterized the lateral femoral circumflex vessels and I then opened up the hip capsule in a L-type format placing the Cobra retractors within the hip capsule.  I found significant disease around the hip.  I then made my femoral neck cut just proximal to the lesser trochanter with an oscillating saw and completed  this with an osteotome. I placed a corkscrew guide in the femoral head and removed the femoral head in its entirety and found to be completely devoid of cartilage.  I then placed a bent Hohmann medially and a Cobra retractor laterally.  I removed remnants of acetabular labrum and debris within the fovea.  I then began reaming from size 42 in 2 mm increments up to a size 50 with all reamers placed under direct visualization and also the last reamer placed under direct fluoroscopy, so we could obtain my depth of reaming by inclination and anteversion.  Once I was pleased with this, I chose a real DePuy Sector Gription acetabular component size  50, I placed the apex hole eliminator guide and a 32+ 4 neutral polyethylene liner. Again all this was done under direct visualization and fluoroscopy. Attention was then turned to the femur with the leg externally rotated to beyond 90 degrees extended and adducted.  Again, I placed a Mueller retractor medially and a Hohmann retractor behind the greater trochanter.  I released the lateral joint capsule and used a box cutting guide to open up the femoral canal and a rongeur to lateralize.  I began broaching from a size 8 broach up to a size 10 broach.  The 10 was felt to be stable.  I used a calcar planer for this and a standard neck trial with 32+ 1 hip ball.  We brought the leg back over and up with traction and internal rotation reduced into the pelvis and she was stable with internal and external rotation even beyond 90 degrees.  She had minimal shuck and her leg lengths were measured exactly equal under direct fluoroscopy.  Of note, she was significantly short before this.  I then dislocated the hip and removed the trial components.  We placed the real Corail femoral component size 10 with a standard offset and the real 32+ 1 ceramic hip ball, and again we reduced this thing into the acetabulum, it was stable.  We used pulsatile lavage to irrigate the hip joint.  I closed the joint capsule with interrupted #1 Ethibond suture followed by running #1 Vicryl in the tensor fascia, 0 Vicryl in the deep tissue, 2-0 Vicryl in subcutaneous tissue, 4-0 Monocryl subcuticular stitch. Dermabond on the skin.  A well-padded ACell dressing was applied.  She was taken off the Hana table awakened, extubated, and taken to recovery room in stable condition.  All final counts were correct and no complications noted.  Of note, Richardean Canal, PA-C was present in the entire case and his presence was crucial case, and positioning, retracting, and closure of the wound.     Vanita Panda. Magnus Ivan,  M.D.     CYB/MEDQ  D:  06/17/2012  T:  06/18/2012  Job:  [id number]

## 2012-06-18 NOTE — Evaluation (Signed)
Occupational Therapy Evaluation Patient Details Name: Tara Fernandez MRN: [id number] DOB: January 05, 1951 Today's Date: 06/18/2012 Time: 1191-4782 OT Time Calculation (min): 21 min  OT Assessment / Plan / Recommendation Clinical Impression    Pt. underwent direct anterior approach left THA and presents to OT with some anxiousness and below problem list. Pt will benefit from acute OT to increase independence prior to d/c home.      OT Assessment  Patient needs continued OT Services    Follow Up Recommendations  No OT follow up;Supervision/Assistance - 24 hour    Barriers to Discharge      Equipment Recommendations  None recommended by OT    Recommendations for Other Services    Frequency  Min 2X/week    Precautions / Restrictions Precautions Precautions: None Restrictions Weight Bearing Restrictions: Yes LLE Weight Bearing: Weight bearing as tolerated   Pertinent Vitals/Pain Pt wincing when elevating legs in recliner chair, otherwise did not report pain. Repositioned.     ADL  Eating/Feeding: Independent Where Assessed - Eating/Feeding: Chair Grooming: Set up Where Assessed - Grooming: Unsupported sitting Upper Body Bathing: Set up Where Assessed - Upper Body Bathing: Unsupported sitting Lower Body Bathing: Min guard Where Assessed - Lower Body Bathing: Supported sit to stand Upper Body Dressing: Set up Where Assessed - Upper Body Dressing: Unsupported sitting Lower Body Dressing: Min guard Where Assessed - Lower Body Dressing: Supported sit to Pharmacist, hospital: Hydrographic surveyor Method: Sit to Barista: Raised toilet seat with arms (or 3-in-1 over toilet) Toileting - Clothing Manipulation and Hygiene: Supervision/safety Where Assessed - Engineer, mining and Hygiene: Sit on 3-in-1 or toilet Tub/Shower Transfer Method: Not assessed Equipment Used: Gait belt;Rolling walker;Sock aid;Reacher Transfers/Ambulation Related to  ADLs: Minguard ADL Comments: Pt donned panties with reacher at [employer] assist without AE. Pt practiced doffing sock with reacher and donning sock with sockaid. Pt on 3 in 1 with PT when OT arrived.     OT Diagnosis: Acute pain  OT Problem List: Decreased activity tolerance;Decreased knowledge of use of DME or AE;Pain;Decreased strength OT Treatment Interventions: Self-care/ADL training;DME and/or AE instruction;Therapeutic activities;Patient/family education   OT Goals Acute Rehab OT Goals OT Goal Formulation: With patient Time For Goal Achievement: 06/25/12 Potential to Achieve Goals: Good ADL Goals Pt Will Transfer to Toilet: with modified independence;Ambulation;with DME ADL Goal: Toilet Transfer - Progress: Goal set today Pt Will Perform Toileting - Clothing Manipulation: with modified independence;Standing ADL Goal: Toileting - Clothing Manipulation - Progress: Goal set today Pt Will Perform Toileting - Hygiene: with modified independence;Sit to stand from 3-in-1/toilet;Sitting on 3-in-1 or toilet ADL Goal: Toileting - Hygiene - Progress: Goal set today Pt Will Perform Tub/Shower Transfer: Tub transfer;with supervision;with DME ADL Goal: Tub/Shower Transfer - Progress: Goal set today  Visit Information  Last OT Received On: 06/18/12 Assistance Needed: +1 PT/OT Co-Evaluation/Treatment: Yes    Subjective Data      Prior Functioning     Home Living Lives With: Spouse Available Help at Discharge: Available 24 hours/day;Family;Friend(s) Type of Home: House Home Access: Stairs to enter [employer] of Steps: 2 Entrance Stairs-Rails: Left Home Layout: One level Bathroom Shower/Tub: Forensic scientist: Standard Bathroom Accessibility: Yes How Accessible: Accessible via walker Home Adaptive Equipment: Walker - rolling;Bedside commode/3-in-1;Tub transfer bench;Sock aid;Reacher Prior Function Level of Independence: Independent Able to Take  Stairs?: Yes Driving: Yes Vocation: Part time employment Communication Communication: No difficulties         Vision/Perception  Cognition  Cognition Arousal/Alertness: Awake/alert Behavior During Therapy: Anxious Overall Cognitive Status: Within Functional Limits for tasks assessed    Extremity/Trunk Assessment Right Upper Extremity Assessment RUE ROM/Strength/Tone: [place] for tasks assessed Left Upper Extremity Assessment LUE ROM/Strength/Tone: WFL for tasks assessed     Mobility Bed Mobility Bed Mobility: Not assessed Transfers Transfers: Sit to Stand;Stand to Sit Sit to Stand: 4: Min guard;With upper extremity assist;From chair/3-in-1;With armrests Stand to Sit: 4: Min guard;With upper extremity assist;With armrests;To chair/3-in-1 Details for Transfer Assistance: cues for hand placement and technique     Exercise     Balance     End of Session OT - End of Session Equipment Utilized During Treatment: Gait belt Activity Tolerance: Patient tolerated treatment well Patient left: in chair;with call bell/phone within reach  [employer] OTR/L [phone] 06/18/2012, 4:08 PM

## 2012-06-18 NOTE — Progress Notes (Signed)
Subjective: 1 Day Post-Op Procedure(s) (LRB): LEFT TOTAL HIP ARTHROPLASTY ANTERIOR APPROACH (Left) Patient reports pain as moderate.    Objective: Vital signs in last 24 hours: Temp:  [96.5 F (35.8 C)-98.7 F (37.1 C)] 97.1 F (36.2 C) (06/13 0553) Pulse Rate:  [61-88] 80 (06/13 0553) Resp:  [11-29] 18 (06/13 0553) BP: (91-145)/(48-78) 145/78 mmHg (06/13 0553) SpO2:  [88 %-100 %] 88 % (06/13 0553)  Intake/Output from previous day: 06/12 0701 - 06/13 0700 In: 1780 [P.O.:480; I.V.:1300] Out: 200 [Urine:100; Blood:100] Intake/Output this shift:     Recent Labs  06/15/12 1443 06/18/12 0505  HGB 14.0 11.1*    Recent Labs  06/15/12 1443 06/18/12 0505  WBC 7.6 9.6  RBC 4.50 3.63*  HCT 40.0 32.2*  PLT 256 201    Recent Labs  06/15/12 1443 06/18/12 0505  NA 139 136  K 3.9 3.9  CL 104 102  CO2 26 28  BUN 15 9  CREATININE 0.69 0.60  GLUCOSE 96 109*  CALCIUM 8.9 8.7   No results found for this basename: LABPT, INR,  in the last 72 hours  Sensation intact distally Intact pulses distally Dorsiflexion/Plantar flexion intact Incision: dressing C/D/I  Assessment/Plan: 1 Day Post-Op Procedure(s) (LRB): LEFT TOTAL HIP ARTHROPLASTY ANTERIOR APPROACH (Left) Up with therapy, WBAT left hip, no precautions  Tara Fernandez Y 06/18/2012, 7:30 AM

## 2012-06-18 NOTE — Evaluation (Signed)
Physical Therapy Evaluation Patient Details Name: Tara Fernandez MRN: [id number] DOB: 15-Aug-1950 Today's Date: 06/18/2012 Time: 4098-1191 PT Time Calculation (min): 41 min  PT Assessment / Plan / Recommendation Clinical Impression  Pt. underwent direct anterior approach left THA and presents to PT with some anxiousness as well as decreased functional mobility and gait.  she needs acute PT to address these and below issues for safe return to home at time of DC.    PT Assessment  Patient needs continued PT services    Follow Up Recommendations  Home health PT;Supervision/Assistance - 24 hour;Supervision for mobility/OOB    Does the patient have the potential to tolerate intense rehabilitation      Barriers to Discharge None Pt. says she can arrange for 24 hour care at time of Dc home    Equipment Recommendations  None recommended by PT    Recommendations for Other Services     Frequency 7X/week    Precautions / Restrictions Precautions Precautions: None;Other (comment) (direct anterior, no precautions per Dr. Magnus Ivan) Restrictions Weight Bearing Restrictions: Yes LLE Weight Bearing: Weight bearing as tolerated   Pertinent Vitals/Pain See vitals tab       Mobility  Bed Mobility Bed Mobility: Supine to Sit;Sitting - Scoot to Edge of Bed;Sit to Supine Supine to Sit: 4: Min assist;HOB elevated;With rails Sitting - Scoot to Edge of Bed: 4: Min assist Sit to Supine: Not Tested (comment) Details for Bed Mobility Assistance: cues for technique and seqeunce Transfers Transfers: Sit to Stand;Stand to Sit Sit to Stand: 4: Min assist;With upper extremity assist;From bed Stand to Sit: 4: Min assist;With upper extremity assist;To chair/3-in-1 Details for Transfer Assistance: cues for hand placement and technique Ambulation/Gait Ambulation/Gait Assistance: 1: +2 Total assist;Other (comment) (second person for safety and recliner chair) Ambulation/Gait: Patient Percentage:  80% Ambulation Distance (Feet): 40 Feet Assistive device: Rolling walker Ambulation/Gait Assistance Details: multiple cues for sequencing and correct RW and step distance.  Min. assist for stabilty. Gait Pattern: Step-to pattern;Decreased weight shift to right Gait velocity: decreased General Gait Details: pt. reports she had leg length discrepancy PTA  says her surgeon told her he fixed that.  She seems to have difficulty adjusting to even leg lengths and tends to have increased shift to right with heavy right step. Stairs: No    Exercises Total Joint Exercises Ankle Circles/Pumps: AROM;Both;10 reps Quad Sets: AROM;Left;10 reps   PT Diagnosis: Difficulty walking;Abnormality of gait;Acute pain  PT Problem List: Decreased strength;Decreased activity tolerance;Decreased mobility;Decreased knowledge of use of DME;Pain PT Treatment Interventions: DME instruction;Gait training;Functional mobility training;Therapeutic activities;Therapeutic exercise;Patient/family education   PT Goals Acute Rehab PT Goals PT Goal Formulation: With patient Time For Goal Achievement: 06/25/12 Potential to Achieve Goals: Good Pt will go Supine/Side to Sit: with modified independence PT Goal: Supine/Side to Sit - Progress: Goal set today Pt will go Sit to Supine/Side: with modified independence PT Goal: Sit to Supine/Side - Progress: Goal set today Pt will go Sit to Stand: with modified independence PT Goal: Sit to Stand - Progress: Goal set today Pt will Ambulate: >150 feet;with modified independence;with rolling walker PT Goal: Ambulate - Progress: Goal set today Pt will Go Up / Down Stairs: 1-2 stairs;with min assist;with rail(s) PT Goal: Up/Down Stairs - Progress: Goal set today Pt will Perform Home Exercise Program: Independently PT Goal: Perform Home Exercise Program - Progress: Goal set today  Visit Information  Last PT Received On: 06/18/12 Assistance Needed: +2    Subjective Data  Subjective:  Pt.  reports she is scared of moving first time Patient Stated Goal: return to work   Prior Functioning  Home Living Lives With: Spouse Available Help at Discharge: Available 24 hours/day;Family;Friend(s) Type of Home: House Home Access: Stairs to enter Secretary/administrator of Steps: 2 Entrance Stairs-Rails: Left Home Layout: One level Bathroom Shower/Tub: Forensic scientist: Standard Bathroom Accessibility: Yes How Accessible: Accessible via walker Home Adaptive Equipment: Walker - rolling;Bedside commode/3-in-1;Tub transfer bench Prior Function Level of Independence: Independent Able to Take Stairs?: Yes Driving: Yes Vocation: Part time employment Communication Communication: No difficulties    Cognition  Cognition Arousal/Alertness: Awake/alert Behavior During Therapy: Anxious Overall Cognitive Status: Within Functional Limits for tasks assessed    Extremity/Trunk Assessment Right Upper Extremity Assessment RUE ROM/Strength/Tone: WFL for tasks assessed RUE Sensation: WFL - Light Touch RUE Coordination: WFL - gross/fine motor Left Upper Extremity Assessment LUE ROM/Strength/Tone: WFL for tasks assessed LUE Sensation: WFL - Light Touch LUE Coordination: WFL - gross/fine motor Right Lower Extremity Assessment RLE ROM/Strength/Tone: WFL for tasks assessed RLE Sensation: WFL - Light Touch RLE Coordination: WFL - gross/fine motor Left Lower Extremity Assessment LLE ROM/Strength/Tone: Deficits LLE ROM/Strength/Tone Deficits: good ankle pump, fair quad set LLE Sensation: WFL - Light Touch Trunk Assessment Trunk Assessment: Normal   Balance    End of Session PT - End of Session Equipment Utilized During Treatment: Gait belt Activity Tolerance: Patient tolerated treatment well;Patient limited by pain Patient left: in chair;with call bell/phone within reach Nurse Communication: Mobility status;Weight bearing status  GP     Ferman Hamming 06/18/2012, 1:48 PM [place] Picking PT Acute Rehab Services [phone] [place] [phone]

## 2012-06-18 NOTE — Progress Notes (Signed)
Physical Therapy Treatment Patient Details Name: Tara Fernandez MRN: [id number] DOB: 16-Feb-1950 Today's Date: 06/18/2012 Time: 7829-5621 PT Time Calculation (min): 26 min  PT Assessment / Plan / Recommendation Comments on Treatment Session  Pt. not quite as fearful this pm but has some persistent anxiety for tasks.  she is gradually progressing her mobility .  Not ready for steps today, but will hopefuly be able to practice steps tomorrow.      Follow Up Recommendations  Home health PT;Supervision/Assistance - 24 hour;Supervision for mobility/OOB     Does the patient have the potential to tolerate intense rehabilitation     Barriers to Discharge        Equipment Recommendations  None recommended by PT    Recommendations for Other Services    Frequency 7X/week   Plan Discharge plan remains appropriate;Frequency remains appropriate    Precautions / Restrictions Precautions Precautions: None Restrictions Weight Bearing Restrictions: Yes LLE Weight Bearing: Weight bearing as tolerated   Pertinent Vitals/Pain See vitals tab     Mobility  Bed Mobility Bed Mobility: Not assessed Transfers Transfers: Sit to Stand;Stand to Sit Sit to Stand: 4: Min guard;With upper extremity assist;From chair/3-in-1;With armrests Stand to Sit: 4: Min guard;With upper extremity assist;With armrests;To chair/3-in-1 Details for Transfer Assistance: cues for hand placement and technique Ambulation/Gait Ambulation/Gait Assistance: 4: Min guard (second person for safety) Ambulation/Gait: Patient Percentage: 80% Ambulation Distance (Feet): 40 Feet Assistive device: Rolling walker Ambulation/Gait Assistance Details: Frequent cueing for sequencing and correct use of RW, step length. Mina guard for safety and stability Gait Pattern: Step-to pattern;Decreased weight shift to right Gait velocity: decreased    Exercises     PT Diagnosis:    PT Problem List:   PT Treatment Interventions:     PT  Goals Acute Rehab PT Goals Pt will go Sit to Stand: with modified independence PT Goal: Sit to Stand - Progress: Progressing toward goal Pt will Ambulate: >150 feet;with modified independence;with rolling walker PT Goal: Ambulate - Progress: Progressing toward goal  Visit Information  Last PT Received On: 06/18/12 Assistance Needed: +1    Subjective Data  Subjective: Requests to walk to bathroom   Cognition  Cognition Arousal/Alertness: Awake/alert Behavior During Therapy: Anxious Overall Cognitive Status: Within Functional Limits for tasks assessed    Balance     End of Session PT - End of Session Equipment Utilized During Treatment: Gait belt Activity Tolerance: Patient tolerated treatment well;Patient limited by pain Patient left: in chair;with call bell/phone within reach Nurse Communication: Mobility status;Weight bearing status   GP     Ferman Hamming 06/18/2012, 3:46 PM [place] Picking PT Acute Rehab Services [phone] [place] [phone]

## 2012-06-19 LAB — CBC
HCT: 33.5 % — ABNORMAL LOW (ref 36.0–46.0)
MCHC: 34 g/dL (ref 30.0–36.0)
MCV: 90.1 fL (ref 78.0–100.0)
RDW: 13 % (ref 11.5–15.5)

## 2012-06-19 MED ORDER — OXYCODONE-ACETAMINOPHEN 5-325 MG PO TABS: 1.0000 | ORAL_TABLET | ORAL | Status: DC | PRN

## 2012-06-19 MED ORDER — ASPIRIN EC 325 MG PO TBEC: 325.0000 mg | DELAYED_RELEASE_TABLET | Freq: Two times a day (BID) | ORAL | Status: DC

## 2012-06-19 MED ORDER — METHOCARBAMOL 500 MG PO TABS: 500.0000 mg | ORAL_TABLET | Freq: Four times a day (QID) | ORAL | Status: DC | PRN

## 2012-06-19 NOTE — Progress Notes (Signed)
Physical Therapy Treatment Patient Details Name: Tara Fernandez MRN: [id number] DOB: 1950/05/12 Today's Date: 06/19/2012 Time: 4098-1191 PT Time Calculation (min): 29 min  PT Assessment / Plan / Recommendation Comments on Treatment Session  Progressing with distance and sequence with ambulation.  Will practice steps in a.m.    Follow Up Recommendations  Home health PT;Supervision/Assistance - 24 hour     Does the patient have the potential to tolerate intense rehabilitation   N/A  Barriers to Discharge  None      Equipment Recommendations  None recommended by PT    Recommendations for Other Services  None  Frequency 7X/week   Plan Discharge plan remains appropriate    Precautions / Restrictions Precautions Precautions: None Restrictions LLE Weight Bearing: Weight bearing as tolerated   Pertinent Vitals/Pain Min c/o left hip soreness ice applied    Mobility  Bed Mobility Bed Mobility: Scooting to HOB Sit to Supine: 4: Min assist;HOB flat Scooting to HOB: 5: Supervision Details for Bed Mobility Assistance: assist for left LE Transfers Sit to Stand: 5: Supervision;With armrests;From chair/3-in-1 Stand to Sit: 5: Supervision;To chair/3-in-1;With armrests Details for Transfer Assistance: cues to move left foot out to sit for comfort Ambulation/Gait Ambulation/Gait Assistance: 5: Supervision Ambulation Distance (Feet): 230 Feet Assistive device: Rolling walker Ambulation/Gait Assistance Details: initially with cues for sequence, pt unable to keep consistent sequence, so educated to walk without thinking about sequence and pt did better Gait Pattern: Decreased step length - right;Trunk flexed;Decreased hip/knee flexion - left;Decreased stride length         PT Goals Acute Rehab PT Goals Pt will go Sit to Supine/Side: with modified independence PT Goal: Sit to Supine/Side - Progress: Progressing toward goal Pt will go Sit to Stand: with modified independence PT Goal:  Sit to Stand - Progress: Progressing toward goal Pt will Ambulate: >150 feet;with modified independence;with rolling walker PT Goal: Ambulate - Progress: Progressing toward goal Pt will Perform Home Exercise Program: Independently PT Goal: Perform Home Exercise Program - Progress: Progressing toward goal  Visit Information  Last PT Received On: 06/19/12    Subjective Data  Subjective: Want to walk   Cognition  Cognition Arousal/Alertness: Awake/alert Behavior During Therapy: WFL for tasks assessed/performed Overall Cognitive Status: Within Functional Limits for tasks assessed    Balance  Balance Balance Assessed: Yes Static Standing Balance Static Standing - Balance Support: No upper extremity supported;During functional activity Static Standing - Level of Assistance: 5: Stand by assistance Static Standing - Comment/# of Minutes: washing hands, cues for decreased proximity to sink  End of Session PT - End of Session Equipment Utilized During Treatment: Gait belt Activity Tolerance: Patient tolerated treatment well Patient left: in bed;with call bell/phone within reach   GP     [facility] 06/19/2012, 4:55 PM [place], [place] [phone] 06/19/2012

## 2012-06-19 NOTE — Progress Notes (Signed)
Physical Therapy Treatment Patient Details Name: Tara Fernandez MRN: [id number] DOB: 1950/11/18 Today's Date: 06/19/2012 Time: 4098-1191 PT Time Calculation (min): 28 min  PT Assessment / Plan / Recommendation Comments on Treatment Session  Patient is progressing nicely with ambulation distance and independence.  Not ready yet for d/c till able to get to practice steps.  Was not ready yet this am.  Needs increased time for all mobility.    Follow Up Recommendations  Home health PT;Supervision/Assistance - 24 hour     Does the patient have the potential to tolerate intense rehabilitation   N/A  Barriers to Discharge  None      Equipment Recommendations  None recommended by PT    Recommendations for Other Services  None  Frequency 7X/week   Plan Discharge plan remains appropriate    Precautions / Restrictions Precautions Precautions: None Restrictions Weight Bearing Restrictions: Yes LLE Weight Bearing: Weight bearing as tolerated   Pertinent Vitals/Pain Mod c/o left hip pain with movement; ice applied after tx    Mobility  Bed Mobility Bed Mobility: Not assessed Details for Bed Mobility Assistance: pt up in recliner Transfers Sit to Stand: 5: Supervision;With armrests;From chair/3-in-1 Stand to Sit: 5: Supervision;To chair/3-in-1;With armrests Details for Transfer Assistance: cues to move left foot out to sit for comfort Ambulation/Gait Ambulation/Gait Assistance: 5: Supervision Ambulation Distance (Feet): 130 Feet Assistive device: Rolling walker Ambulation/Gait Assistance Details: slow pace, increased time to progress left LE, self corrects inaccurate sequence Gait Pattern: Decreased step length - right;Trunk flexed;Decreased hip/knee flexion - left;Decreased stride length    Exercises Total Joint Exercises Quad Sets: AROM;Left;5 reps;Seated Heel Slides: AAROM;Left;5 reps;Seated Hip ABduction/ADduction: AAROM;Left;5 reps;Seated    PT Goals Acute Rehab PT  Goals PT Goal: Sit to Stand - Progress: Progressing toward goal Pt will Ambulate: >150 feet;with modified independence;with rolling walker PT Goal: Ambulate - Progress: Progressing toward goal Pt will Perform Home Exercise Program: Independently PT Goal: Perform Home Exercise Program - Progress: Progressing toward goal  Visit Information  Last PT Received On: 06/19/12 Assistance Needed: +1    Subjective Data  Subjective: Don't feel ready to go home today.   Cognition  Cognition Arousal/Alertness: Awake/alert Behavior During Therapy: WFL for tasks assessed/performed Overall Cognitive Status: Within Functional Limits for tasks assessed    Balance  Balance Balance Assessed: Yes Static Standing Balance Static Standing - Balance Support: No upper extremity supported;During functional activity Static Standing - Level of Assistance: 5: Stand by assistance Static Standing - Comment/# of Minutes: standing to wash hands at sink; standing prior to sitting in chair cues for posture, trunk extension  End of Session PT - End of Session Equipment Utilized During Treatment: Gait belt Activity Tolerance: Patient limited by fatigue Patient left: in chair;with call bell/phone within reach   GP     [facility] 06/19/2012, 1:34 PM [place], [place] [phone] 06/19/2012

## 2012-06-19 NOTE — Progress Notes (Signed)
Occupational Therapy Treatment Patient Details Name: Tara Fernandez MRN: [id number] DOB: May 27, 1950 Today's Date: 06/19/2012 Time: 7829-5621 OT Time Calculation (min): 11 min  OT Assessment / Plan / Recommendation Comments on Treatment Session All OT education completed and patient no longer needs OT services. Will sign off.    Follow Up Recommendations  No OT follow up;Supervision/Assistance - 24 hour    Barriers to Discharge       Equipment Recommendations  None recommended by OT    Recommendations for Other Services    Frequency     Plan All goals met and education completed, patient discharged from OT services    Precautions / Restrictions Precautions Precautions: None Restrictions Weight Bearing Restrictions: Yes LLE Weight Bearing: Weight bearing as tolerated   Pertinent Vitals/Pain     ADL  Eating/Feeding: Independent Where Assessed - Eating/Feeding: Chair Grooming: Set up Where Assessed - Grooming: Unsupported sitting Upper Body Bathing: Set up Where Assessed - Upper Body Bathing: Unsupported sitting Lower Body Bathing: Set up Where Assessed - Lower Body Bathing: Supported sit to stand Upper Body Dressing: Set up Where Assessed - Upper Body Dressing: Unsupported sitting Lower Body Dressing: Set up Where Assessed - Lower Body Dressing: Supported sit to Pharmacist, hospital: Supervision/safety Statistician Method: Sit to Barista: Raised toilet seat with arms (or 3-in-1 over toilet) Toileting - Clothing Manipulation and Hygiene: Supervision/safety Where Assessed - Engineer, mining and Hygiene: Sit on 3-in-1 or toilet Tub/Shower Transfer: Engineer, manufacturing Method: Stand pivot Psychologist, educational: Counsellor Used: Gait belt;Rolling walker Transfers/Ambulation Related to ADLs: supervision ADL Comments: Patient took a shower and got dressed this morning. She  reports no difficulties with either task.         OT Goals    Visit Information  Last OT Received On: 06/19/12 Assistance Needed: +1    Cognition  Cognition Arousal/Alertness: Awake/alert Behavior During Therapy: WFL for tasks assessed/performed Overall Cognitive Status: Within Functional Limits for tasks assessed    End of Session OT - End of Session Equipment Utilized During Treatment: Gait belt Activity Tolerance: Patient tolerated treatment well Patient left: in chair;with call bell/phone within reach  GO     Jawuan Robb A 06/19/2012, 12:03 PM

## 2012-06-20 LAB — CBC
MCH: 30.8 pg (ref 26.0–34.0)
MCHC: 34.3 g/dL (ref 30.0–36.0)
Platelets: 184 10*3/uL (ref 150–400)
RBC: 3.21 MIL/uL — ABNORMAL LOW (ref 3.87–5.11)
RDW: 12.8 % (ref 11.5–15.5)

## 2012-06-20 NOTE — Progress Notes (Signed)
   CARE MANAGEMENT NOTE 06/20/2012  Patient:  Tara Fernandez, Tara Fernandez   Account Number:  [id number]  Date Initiated:  06/20/2012  Documentation initiated by:  [facility]  Subjective/Objective Assessment:   Left total hip arthroplasty through direct anterior approach.     Action/Plan:   has DME at home   Anticipated DC Date:  06/20/2012   Anticipated DC Plan:  HOME W HOME HEALTH SERVICES      DC Planning Services  CM consult      [facility] Choice  HOME HEALTH   Choice offered to / List presented to:  C-1 Patient        HH arranged  HH-2 PT      [facility] agency  [facility]   Status of service:  Completed, signed off Medicare Important Message given?   (If response is "NO", the following Medicare IM given date fields will be blank) Date Medicare IM given:   Date Additional Medicare IM given:    Discharge Disposition:  HOME W HOME HEALTH SERVICES  Per UR Regulation:    If discussed at Long Length of Stay Meetings, dates discussed:    Comments:  06/20/2012 1215 NCM spoke to pt and states she has DME at home. Explained Genevieve Norlander will call to arrange appt. Notified [facility] of pt's dc home today. Isidoro Donning RN CCM Case Mgmt phone [phone]

## 2012-06-20 NOTE — Progress Notes (Signed)
Physical Therapy Treatment Patient Details Name: Tara Fernandez MRN: [id number] DOB: May 24, 1950 Today's Date: 06/20/2012 Time: 1478-2956 PT Time Calculation (min): 28 min  PT Assessment / Plan / Recommendation Comments on Treatment Session  Patient demonstrating mobility safe for d/c home with spouse assist.  Feel right knee pain limits ease with stair negotiation.  Encouraged to have spouse assist as needed and to practice further with HHPT for improved confidence and ease.  Pt questioning regarding tub transfer.  Will inform OT about pt concerns as they plan to see today.    Follow Up Recommendations  Home health PT;Supervision/Assistance - 24 hour        Barriers to Discharge  None      Equipment Recommendations  None recommended by PT    Recommendations for Other Services  None  Frequency 7X/week   Plan Discharge plan remains appropriate    Precautions / Restrictions Restrictions LLE Weight Bearing: Weight bearing as tolerated   Pertinent Vitals/Pain C/o left hip soreness and knee soreness encouraged use of ice    Mobility  Bed Mobility Bed Mobility: Not assessed Details for Bed Mobility Assistance: up in recliner Transfers Sit to Stand: 6: Modified independent (Device/Increase time);With armrests;From chair/3-in-1 Stand to Sit: 6: Modified independent (Device/Increase time);With armrests;To chair/3-in-1 Ambulation/Gait Ambulation/Gait Assistance: 6: Modified independent (Device/Increase time) Ambulation Distance (Feet): 160 Feet Assistive device: Rolling walker Gait Pattern: Decreased step length - right;Trunk flexed;Decreased hip/knee flexion - left;Decreased stride length Stairs: Yes Stairs Assistance: 4: Min assist Stairs Assistance Details (indicate cue type and reason): Initially educated with cane and left railing, pt felt insecure and c/o right knee pain lowering left leg first, gave support under left arm and held left hand and pt more confident.  Encouraged her  to educate spouse how to assist for this technique.  Also educated sequence due to left LE surgical pain and limited motion and should be able to use self selected sequence in week or two and to practice with HHPT. Stair Management Technique: Step to pattern;Forwards;One rail Left;With cane;Other (comment) (cane versus hand held assist) Number of Stairs: 6    Exercises Other Exercises Other Exercises: Educated on and demonstrated car transfer into passenger front seat.  Pt verbalized understanding.     PT Goals Acute Rehab PT Goals Pt will go Sit to Stand: with modified independence PT Goal: Sit to Stand - Progress: Met Pt will Ambulate: >150 feet;with modified independence;with rolling walker PT Goal: Ambulate - Progress: Met Pt will Go Up / Down Stairs: 1-2 stairs;with min assist;with rail(s) PT Goal: Up/Down Stairs - Progress: Progressing toward goal  Visit Information  Last PT Received On: 06/20/12    Subjective Data  Subjective: Ready to go home today.   Cognition  Cognition Arousal/Alertness: Awake/alert Behavior During Therapy: WFL for tasks assessed/performed Overall Cognitive Status: Within Functional Limits for tasks assessed    Balance     End of Session PT - End of Session Activity Tolerance: Patient tolerated treatment well Patient left: in chair;with call bell/phone within reach   GP     [facility] 06/20/2012, 9:24 AM Sheran Lawless, PT [phone] 06/20/2012

## 2012-06-22 ENCOUNTER — Encounter (HOSPITAL_COMMUNITY): Payer: Self-pay | Admitting: Orthopaedic Surgery

## 2012-06-28 NOTE — Discharge Summary (Signed)
Patient ID: Tara Fernandez MRN: [id number] DOB/AGE: 07-Mar-1950 62 y.o.  Admit date: 06/17/2012 Discharge date: 06/28/2012  Admission Diagnoses:  Principal Problem:   Degenerative arthritis of hip   Discharge Diagnoses:  Same  Past Medical History  Diagnosis Date  . Arthritis     osteoarthritis    Surgeries: Procedure(s): LEFT TOTAL HIP ARTHROPLASTY ANTERIOR APPROACH on 06/17/2012   Consultants:    Discharged Condition: Improved  Hospital Course: Tara Fernandez is an 62 y.o. female who was admitted 06/17/2012 for operative treatment ofDegenerative arthritis of hip. Patient has severe unremitting pain that affects sleep, daily activities, and work/hobbies. After pre-op clearance the patient was taken to the operating room on 06/17/2012 and underwent  Procedure(s): LEFT TOTAL HIP ARTHROPLASTY ANTERIOR APPROACH.    Patient was given perioperative antibiotics:  Anti-infectives   Start     Dose/Rate Route Frequency Ordered Stop   06/17/12 1600  ceFAZolin (ANCEF) IVPB 1 g/50 mL premix     1 g 100 mL/hr over 30 Minutes Intravenous Every 6 hours 06/17/12 1343 06/17/12 2217   06/17/12 0600  ceFAZolin (ANCEF) IVPB 2 g/50 mL premix     2 g 100 mL/hr over 30 Minutes Intravenous On call to O.R. 06/16/12 1445 06/17/12 1014       Patient was given sequential compression devices, early ambulation, and chemoprophylaxis to prevent DVT.  Patient benefited maximally from hospital stay and there were no complications.    Recent vital signs: No data found.    Recent laboratory studies: No results found for this basename: WBC, HGB, HCT, PLT, NA, K, CL, CO2, BUN, CREATININE, GLUCOSE, PT, INR, CALCIUM, 2,  in the last 72 hours   Discharge Medications:     Medication List    STOP taking these medications       meloxicam 7.5 MG tablet  Commonly known as:  MOBIC      TAKE these medications       aspirin EC 325 MG tablet  Take 1 tablet (325 mg total) by mouth 2 (two) times daily after a  meal.     DHEA PO  Take 1 tablet by mouth daily.     EVENING PRIMROSE OIL PO  Take 1,300 mg by mouth daily.     GLUCOSAMINE PO  Take 1 tablet by mouth daily.     MAGNESIUM PO  Take 1 tablet by mouth daily.     methocarbamol 500 MG tablet  Commonly known as:  ROBAXIN  Take 1 tablet (500 mg total) by mouth every 6 (six) hours as needed.     OVER THE COUNTER MEDICATION  Take 1 tablet by mouth daily. Med: positive energy     OVER THE COUNTER MEDICATION  Apply 1 application topically every other day. Over the counter progesterone cream     oxyCODONE-acetaminophen 5-325 MG per tablet  Commonly known as:  ROXICET  Take 1-2 tablets by mouth every 4 (four) hours as needed for pain.     Potassium 99 MG Tabs  Take 99 mg by mouth daily.     VITAMIN D PO  Take 10,000 Units by mouth daily.        Diagnostic Studies: Dg Hip Operative Left  06/17/2012   *RADIOLOGY REPORT*  Clinical Data: Left hip arthroplasty  OPERATIVE LEFT HIP  Comparison: None.  Findings: C-arm images of the left hip were obtained.  Total hip replacement in satisfactory position and alignment.  No acute complication.  IMPRESSION: Satisfactory left hip replacement.  Original Report Authenticated By: Janeece Riggers, M.D.   Dg Pelvis Portable  06/17/2012   *RADIOLOGY REPORT*  Clinical Data: Postoperative radiograph.  Left anterior hip arthroplasty.  PORTABLE PELVIS  Comparison: Lateral view today.  Findings: New left total hip arthroplasty is present.  Expected postoperative changes in the soft tissues.  Hip appears located. Moderate to severe right hip osteoarthritis is present with os acetabulum nearby.  Appears to be a pessary within the vagina.  IMPRESSION: Uncomplicated new left total hip arthroplasty.   Original Report Authenticated By: Andreas Newport, M.D.   Dg Hip Portable 1 View Left  06/17/2012   *RADIOLOGY REPORT*  Clinical Data: New left total hip arthroplasty.  PORTABLE LEFT HIP - 1 VIEW  Comparison: None.   Findings: Cross-table lateral demonstrates the new left total hip arthroplasty is located.  IMPRESSION: Uncomplicated new left total hip arthroplasty.   Original Report Authenticated By: Andreas Newport, M.D.    Disposition: 06-Home-Health Care Svc      Discharge Orders   Future Orders Complete By Expires     Call MD / Call 911  As directed     Comments:      If you experience chest pain or shortness of breath, CALL 911 and be transported to the hospital emergency room.  If you develope a fever above 101 F, pus (white drainage) or increased drainage or redness at the wound, or calf pain, call your surgeon's office.    Constipation Prevention  As directed     Comments:      Drink plenty of fluids.  Prune juice may be helpful.  You may use a stool softener, such as Colace (over the counter) 100 mg twice a day.  Use MiraLax (over the counter) for constipation as needed.    Diet - low sodium heart healthy  As directed     Discharge instructions  As directed     Comments:      You can increase your activities as comfort allows. You may shower with your current dressing in place. You should remove your current dressing next Tuesday 6/17, and then you can get your actual incision wet. Do not pick at your incision.    Increase activity slowly as tolerated  As directed        Follow-up Information   Follow up with Kathryne Hitch, MD In 2 weeks.   Contact information:   [address] [place] [address] [phone]       Follow up with [facility] (Home Health Physical Therapy)    Contact information:   [phone]       Signed: Kathryne Hitch 06/28/2012, 7:12 AM

## 2012-07-23 ENCOUNTER — Inpatient Hospital Stay: Admit: 2012-07-23 | Payer: Self-pay | Admitting: Orthopaedic Surgery

## 2012-07-23 SURGERY — ARTHROPLASTY, HIP, TOTAL, ANTERIOR APPROACH
Anesthesia: Choice | Site: Hip | Laterality: Left

## 2012-10-15 ENCOUNTER — Ambulatory Visit (INDEPENDENT_AMBULATORY_CARE_PROVIDER_SITE_OTHER): Payer: BC Managed Care – PPO | Admitting: Gynecology

## 2012-10-15 ENCOUNTER — Telehealth: Payer: Self-pay | Admitting: Gynecology

## 2012-10-15 ENCOUNTER — Encounter: Payer: Self-pay | Admitting: Gynecology

## 2012-10-15 VITALS — BP 120/68 | Resp 14 | Ht 63.0 in | Wt 117.0 lb

## 2012-10-15 DIAGNOSIS — IMO0002 Reserved for concepts with insufficient information to code with codable children: Secondary | ICD-10-CM

## 2012-10-15 DIAGNOSIS — N72 Inflammatory disease of cervix uteri: Secondary | ICD-10-CM

## 2012-10-15 DIAGNOSIS — Z124 Encounter for screening for malignant neoplasm of cervix: Secondary | ICD-10-CM

## 2012-10-15 DIAGNOSIS — N765 Ulceration of vagina: Secondary | ICD-10-CM

## 2012-10-15 DIAGNOSIS — Z01419 Encounter for gynecological examination (general) (routine) without abnormal findings: Secondary | ICD-10-CM

## 2012-10-15 DIAGNOSIS — N8111 Cystocele, midline: Secondary | ICD-10-CM

## 2012-10-15 DIAGNOSIS — N814 Uterovaginal prolapse, unspecified: Secondary | ICD-10-CM

## 2012-10-15 DIAGNOSIS — Z0000 Encounter for general adult medical examination without abnormal findings: Secondary | ICD-10-CM

## 2012-10-15 LAB — POCT URINALYSIS DIPSTICK: pH, UA: 5

## 2012-10-15 MED ORDER — ESTROGENS, CONJUGATED 0.625 MG/GM VA CREA: TOPICAL_CREAM | Freq: Every day | VAGINAL | Status: DC

## 2012-10-15 NOTE — Progress Notes (Signed)
62 y.o. married Caucasian female   G2P2002 here for annual exam. Pt reports menses are absent.  She does not report hot flashes, does not have night sweats, does not have vaginal dryness.  She is not using lubricants.  She does not report post-menopasual bleeding.  Pt has not had annual in 25y. Pt post-menopausal for 15y.  Pt had 2 sisters and mother with lung cancer, heavy smokers, pt nonsmoker.  Pt had been using a pessary for 25y, same pessary-gelhorn- husband removes with crochet hook for sex and cleaning.  Pt reports issues with voiding and defecating if out.  Pt reports some bleeding after removal.  Has been using since last childbirth.  No LMP recorded. Patient is postmenopausal.          Sexually active: yes  The current method of family planning is post menopausal status.    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  Family History  Problem Relation Age of Onset  . Heart attack Father   . Cancer - Lung Sister   . Breast cancer Sister 36  . Cancer - Lung Mother     Patient Active Problem List   Diagnosis Date Noted  . Degenerative arthritis of hip 06/17/2012    Past Medical History  Diagnosis Date  . Arthritis     osteoarthritis    Past Surgical History  Procedure Laterality Date  . Breast mass excision      Hx: of left breast  . Total hip arthroplasty Left 06/17/2012    Procedure: LEFT TOTAL HIP ARTHROPLASTY ANTERIOR APPROACH;  Surgeon: Kathryne Hitch, MD;  Location: [facility] OR;  Service: Orthopedics;  Laterality: Left;  . Cartilage surgery  10 th grade    Allergies: Tramadol; Etodolac; and Latex  Current Outpatient Prescriptions  Medication Sig Dispense Refill  . Cholecalciferol (VITAMIN D PO) Take 10,000 Units by  mouth daily.      Marland Kitchen etodolac (LODINE) 400 MG tablet Take 400 mg by mouth 2 (two) times daily.      Marland Kitchen EVENING PRIMROSE OIL PO Take 1,300 mg by mouth daily.      . Glucosamine HCl (GLUCOSAMINE PO) Take 1 tablet by mouth daily.      Marland Kitchen MAGNESIUM PO Take 1 tablet by mouth daily.      . Nutritional Supplements (DHEA PO) Take 1 tablet by mouth daily.      Marland Kitchen OVER THE COUNTER MEDICATION Take 1 tablet by mouth daily. Med: positive energy      . OVER THE COUNTER MEDICATION Apply 1 application topically every other day. Over the counter progesterone cream      . oxyCODONE-acetaminophen (ROXICET) 5-325 MG per tablet Take 1-2 tablets by mouth every 4 (four) hours as needed for pain.  60 tablet  0  . Potassium 99 MG TABS Take 99 mg by mouth daily.      Marland Kitchen aspirin EC 325 MG tablet Take 1 tablet (325 mg total) by mouth 2 (two) times daily after a meal.  30 tablet  0  . methocarbamol (ROBAXIN) 500 MG tablet Take 1 tablet (500 mg total) by mouth every 6 (six) hours as needed.  60 tablet  0   No current facility-administered medications for this visit.  ROS: Pertinent items are noted in HPI.  Exam:    BP 120/68  Resp 14  Ht 5\' 3"  (1.6 m)  Wt 117 lb (53.071 kg)  BMI 20.73 kg/m2 Weight change: @WEIGHTCHANGE @ Last 3 height recordings:  Ht Readings from Last 3 Encounters:  10/15/12 5\' 3"  (1.6 m)  06/18/12 5\' 4"  (1.626 m)  06/18/12 5\' 4"  (1.626 m)   General appearance: alert, cooperative and appears stated age Head: Normocephalic, without obvious abnormality, atraumatic Neck: no adenopathy, no carotid bruit, no JVD, supple, symmetrical, trachea midline and thyroid not enlarged, symmetric, no tenderness/mass/nodules Lungs: clear to auscultation bilaterally Breasts: normal appearance, no masses or tenderness Heart: regular rate and rhythm, S1, S2 normal, no murmur, click, rub or gallop Abdomen: soft, non-tender; bowel sounds normal; no masses,  no organomegaly Extremities: extremities normal, atraumatic,  no cyanosis or edema Skin: Skin color, texture, turgor normal. No rashes or lesions Lymph nodes: Cervical, supraclavicular, and axillary nodes normal. no inguinal nodes palpated Neurologic: Grossly normal   Pelvic: External genitalia:  no lesions              Urethra: normal appearing urethra with no masses, tenderness or lesions and minimal urethral mucosal porlapse              Bartholins and Skenes: normal                 Vagina: atrophic, marked cystocele               Cervix: deflected posteriorly by cystocele, multiple ulcerations noted most notably on anterior cervix extending to vagina, in addition, lesions noted on lateral cervix, approx 1cm blister noted at 5 o'clock, generalized pallor and inflammation, scarring as result of childbirth, blister tender but no true cervical motion tenderness              Pap taken: yes        Bimanual Exam:  Uterus:  atrophic                                      Adnexa:    no masses                                      Rectovaginal: thin posterior wall                                      Anus:  normal sphincter tone, no lesions  A: well woman Vaginal excoriations related to menopausal status and prolonged pessary use     P: mammogram annually pap smear with HRHPV counseled on breast self exam, mammography screening, feminine hygiene, adequate intake of calcium and vitamin D, diet and exercise discussed importance of leaving pessary out and need for vaginal repair, suggest vaginal estrogen and placement of pre-moistened tampon to support bladder while at work, recommend removing in evenings, suggested rto after 4w of use, pt prefers to return sooner-rto2w.  Pt and spouse are removing pessary often for coitus and suggest if she resumes use of pessary that one more suitable be used. And she is agreeable, in addition, pt had been using several cleaners on her pessary and has resorted to cleaning it with mouth wash- i feel she may be having  a chemical  reaction to the alcohols and other components of the wash and suggest she not use on future pessary Pt unable to do split speculum or valsalva during exam, will readdress prolapse at follow up return annually or prn Discussed PAP guideline changes, importance of weight bearing exercises, calcium, vit D and balanced diet.  An After Visit Summary was printed and given to the patient.

## 2012-10-15 NOTE — Telephone Encounter (Signed)
Spoke with patient. She has concerns taking the premarin cream. She feels that healing the sores she has is not enough benefit to take the risk of using the estrogen cream. She would like advice or if Dr. Farrel Gobble can provide an alternative rx instead of the premarin because she is worried about using the cream and the risk of cancer.  Advised will route to Dr. Farrel Gobble to obtain instruction.

## 2012-10-15 NOTE — Patient Instructions (Signed)

## 2012-10-15 NOTE — Telephone Encounter (Signed)
Please call patient regarding some questions she has about medications.

## 2012-10-16 ENCOUNTER — Encounter: Payer: Self-pay | Admitting: Gynecology

## 2012-10-18 LAB — HEMOGLOBIN, FINGERSTICK: Hemoglobin, fingerstick: 13.7 g/dL (ref 12.0–16.0)

## 2012-10-18 NOTE — Telephone Encounter (Signed)
Patient has also sent mychart message to Dr. Farrel Gobble.

## 2012-10-18 NOTE — Telephone Encounter (Signed)
Response from Dr. Farrel Gobble in Mychart. Patient aware.

## 2012-10-18 NOTE — Telephone Encounter (Signed)
Advised of your message. She stated that she did not feel healing the Vagina was worth getting cancer for. I emphasized that it will be for short term and patient verbalized understanding. She did not state if she would be using cream.

## 2012-10-18 NOTE — Telephone Encounter (Signed)
What was the end all of thes, will she be taking the premarin?

## 2012-10-19 LAB — IPS PAP TEST WITH HPV

## 2012-10-20 ENCOUNTER — Telehealth: Payer: Self-pay | Admitting: Gynecology

## 2012-10-20 NOTE — Telephone Encounter (Signed)
Patient has questions about recent pap results please?

## 2012-10-20 NOTE — Telephone Encounter (Signed)
Patient was unclear on pap results that she was reading in My Chart. Advised pap WNL and atrophic cells are related to lack of estrogen and HR HPV testing negative.   If agreeable and no additional info needed, please sign and close encounter.

## 2012-10-29 ENCOUNTER — Ambulatory Visit: Payer: BC Managed Care – PPO | Admitting: Gynecology

## 2012-11-03 ENCOUNTER — Ambulatory Visit (INDEPENDENT_AMBULATORY_CARE_PROVIDER_SITE_OTHER): Payer: BC Managed Care – PPO | Admitting: Gynecology

## 2012-11-03 ENCOUNTER — Ambulatory Visit: Payer: BC Managed Care – PPO | Admitting: Gynecology

## 2012-11-03 VITALS — BP 116/60 | HR 66 | Resp 12 | Ht 63.0 in | Wt 117.0 lb

## 2012-11-03 DIAGNOSIS — IMO0002 Reserved for concepts with insufficient information to code with codable children: Secondary | ICD-10-CM

## 2012-11-03 DIAGNOSIS — N765 Ulceration of vagina: Secondary | ICD-10-CM

## 2012-11-03 DIAGNOSIS — N814 Uterovaginal prolapse, unspecified: Secondary | ICD-10-CM

## 2012-11-03 DIAGNOSIS — N72 Inflammatory disease of cervix uteri: Secondary | ICD-10-CM

## 2012-11-03 DIAGNOSIS — N8111 Cystocele, midline: Secondary | ICD-10-CM

## 2012-11-03 NOTE — Progress Notes (Signed)
Subjective:     Patient ID: Tara Fernandez, female   DOB: 09-30-1950, 62 y.o.   MRN: [id number]  HPI Comments: Pt is here for follow up, is using vaginal estrogen and is not having any issues, she has left the pessary out as instructed and tried the super+ tampons without success.      Review of Systems  All other systems reviewed and are negative.       Objective:   Physical Exam  Constitutional: She is oriented to person, place, and time. She appears well-developed and well-nourished.  Genitourinary: No erythema or tenderness around the vagina. No signs of injury around the vagina.    Neurological: She is alert and oriented to person, place, and time.  Skin: Skin is warm and dry.  granulation tissue treated with AgNO3      Assessment:     Vaginal ulceration Cystocele Rectocele Uterine prolapse Pessary care     Plan:     Ulcerations improving, pt shown areas of concern, granulation tissue treated with AgNO3, to continue vaginal estrogen but can decrease to twice a week Pt prefers to continue with pessary in stead of proceeding toOR at this time, she was fitted for a ring with support #3, she will return to office for reevaluation and placement after 2w Can stop tampons Continue pelvic rest

## 2012-11-04 ENCOUNTER — Encounter: Payer: Self-pay | Admitting: *Deleted

## 2012-11-04 ENCOUNTER — Telehealth: Payer: Self-pay | Admitting: Gynecology

## 2012-11-04 NOTE — Telephone Encounter (Signed)
Return call to patient, LMTCB ask for Kennon Rounds or [employer]

## 2012-11-04 NOTE — Telephone Encounter (Signed)
Patient aware of 11-19-12 appt at 7 am.

## 2012-11-04 NOTE — Telephone Encounter (Signed)
Patient called saying she needed to schedule another appt for the 14th of November to come back in and check her pessary she is getting inserted that am. It looks like sally scheduled the appt.i am taking a message because she hung up before i could get back from finding sally.(NO CHART)

## 2012-11-05 ENCOUNTER — Telehealth: Payer: Self-pay | Admitting: Emergency Medicine

## 2012-11-05 NOTE — Telephone Encounter (Signed)
Patient called about the email from jasmine but she isnt here to speak with today.

## 2012-11-05 NOTE — Telephone Encounter (Signed)
             Call Documentation    Tiffany A Decker at 11/05/2012 10:03 AM    Status: Signed             Patient called about the email from jasmine but she isnt here to speak with today.             Encounter MyChart Messages    Read Composed From To Subject   Y 11/04/2012 1:25 PM Reggy Eye, CMA Mittie Bodo Pessary         Routing History    Priority Sent On From To Message Type    11/05/2012 10:03 AM Clinton Quant [facility] Triage Pool Pt Advice Request      Created by    Reggy Eye, CMA on 11/04/2012 01:25 PM                           Visit Pharmacy    [id number] - [place], [place] - [address]

## 2012-11-11 ENCOUNTER — Other Ambulatory Visit: Payer: Self-pay

## 2012-11-16 NOTE — Telephone Encounter (Signed)
Kennon Rounds, do you have a pessary for this patient?

## 2012-11-16 NOTE — Telephone Encounter (Signed)
Patient is asking if her pessary came in for appointment 11/19/12.

## 2012-11-17 NOTE — Telephone Encounter (Signed)
VM confirms, "this is [place]", lm that order has arrived and we are ready for her appointment. Call back if additional questions. Routing to provider for final review. Patient agreeable to disposition. Will close encounter

## 2012-11-19 ENCOUNTER — Encounter: Payer: Self-pay | Admitting: Gynecology

## 2012-11-19 ENCOUNTER — Ambulatory Visit (INDEPENDENT_AMBULATORY_CARE_PROVIDER_SITE_OTHER): Payer: BC Managed Care – PPO | Admitting: Gynecology

## 2012-11-19 VITALS — BP 118/72 | HR 66 | Resp 16 | Wt 117.0 lb

## 2012-11-19 DIAGNOSIS — N814 Uterovaginal prolapse, unspecified: Secondary | ICD-10-CM

## 2012-11-19 DIAGNOSIS — IMO0002 Reserved for concepts with insufficient information to code with codable children: Secondary | ICD-10-CM

## 2012-11-19 DIAGNOSIS — N8111 Cystocele, midline: Secondary | ICD-10-CM

## 2012-11-19 DIAGNOSIS — N765 Ulceration of vagina: Secondary | ICD-10-CM

## 2012-11-19 DIAGNOSIS — Z4689 Encounter for fitting and adjustment of other specified devices: Secondary | ICD-10-CM

## 2012-11-19 DIAGNOSIS — N72 Inflammatory disease of cervix uteri: Secondary | ICD-10-CM

## 2012-11-19 NOTE — Progress Notes (Signed)
Subjective:     Patient ID: Tara Fernandez, female   DOB: 10-17-1950, 62 y.o.   MRN: [id number]  HPI Comments: Pt here for pessary fitting, we have switched her from a rigid gelhorn and will now use a ring with support.  Pt has been using the premarin cream and denies any issues with vaginal pain or bleeding.  She is without compliants    Review of Systems  Constitutional: Negative for fever, chills and fatigue.  Genitourinary: Negative for vaginal bleeding, vaginal discharge and vaginal pain.  All other systems reviewed and are negative.       Objective:   Physical Exam  Constitutional: She is oriented to person, place, and time. She appears well-developed and well-nourished.  Genitourinary: There is no rash, tenderness or lesion on the right labia. There is no rash, tenderness or lesion on the left labia. Cervix exhibits no motion tenderness and no discharge. No bleeding around the vagina. No signs of injury (ulceratoins healed, pink) around the vagina.    Lymphadenopathy:       Right: No inguinal adenopathy present.       Left: No inguinal adenopathy present.  Neurological: She is alert and oriented to person, place, and time.  Skin: Skin is warm and dry.  Psychiatric: She has a normal mood and affect. Her behavior is normal. Judgment and thought content normal.       Assessment:     Pessary fitting Cystocele, uterine porlaspe     Plan:     Granulation tissue treated with silver nitrate #4 silicone ring with support placed with ease, pt comfortable Will call back later today to report issues-pt works cleaning houses  Pt comfortable with cleaning, removing and replacing May resume sexual activity after 3d  F/u 94m

## 2012-12-21 ENCOUNTER — Telehealth: Payer: Self-pay | Admitting: Gynecology

## 2012-12-21 NOTE — Telephone Encounter (Signed)
Pt wants to talk with the nurse she thinks she may need a bigger pessary.

## 2012-12-21 NOTE — Telephone Encounter (Signed)
Spoke with patient. She states that her new pessary is not fitting correctly. She feels that it is not wide enough and "parts are falling out". States it "can't hold up what I have". She is requesting next size up. Advised patient, may have cost associated, she is agreeable. Advised would send message to Dr. Farrel Gobble to advise. Patient had to cancel her f/u appointment that was scheduled for this week due to work.

## 2012-12-22 NOTE — Telephone Encounter (Signed)
Kennon Rounds can you order new pessary? Patient okay with cost associated, I advised approximately 40 dollars.

## 2012-12-22 NOTE — Telephone Encounter (Signed)
Pt has a #4 ring with support, had had a gelhorn, they do feel different, ok to order the next size

## 2012-12-24 ENCOUNTER — Ambulatory Visit: Payer: BC Managed Care – PPO | Admitting: Gynecology

## 2013-01-11 NOTE — Telephone Encounter (Signed)
Spoke with pt. Received pessary, called to schedule insertion. Pt already scheduled for 02/04/13 at 7:00am pt would rather Have pessary insertion then unless another appointment is available for Friday at 7:00am.

## 2013-01-24 ENCOUNTER — Other Ambulatory Visit (HOSPITAL_COMMUNITY): Payer: Self-pay

## 2013-01-25 NOTE — Telephone Encounter (Signed)
Insertion scheduled for 01-31-13.

## 2013-01-26 ENCOUNTER — Ambulatory Visit: Payer: BC Managed Care – PPO | Admitting: Gynecology

## 2013-01-28 ENCOUNTER — Other Ambulatory Visit (HOSPITAL_COMMUNITY): Payer: Self-pay | Admitting: Orthopaedic Surgery

## 2013-01-28 NOTE — Progress Notes (Signed)
Dr. Ninfa Linden could you please put orders in [facility] as this patient has a pre-op appointment on 02/03/2013 at 1 pm! Thank you!

## 2013-01-31 ENCOUNTER — Ambulatory Visit (INDEPENDENT_AMBULATORY_CARE_PROVIDER_SITE_OTHER): Payer: BC Managed Care – PPO | Admitting: Gynecology

## 2013-01-31 VITALS — BP 129/79 | HR 84 | Resp 18 | Ht 63.0 in | Wt 119.0 lb

## 2013-01-31 DIAGNOSIS — N814 Uterovaginal prolapse, unspecified: Secondary | ICD-10-CM

## 2013-01-31 DIAGNOSIS — IMO0002 Reserved for concepts with insufficient information to code with codable children: Secondary | ICD-10-CM

## 2013-01-31 DIAGNOSIS — N8111 Cystocele, midline: Secondary | ICD-10-CM

## 2013-01-31 NOTE — Progress Notes (Signed)
63 y.o. Married White female [id number] here for pessary plaement.  Patient has been using following pessary style and size:  Ring with support #4 and is now being fitted for a ring #5.  She is sexually active.  She describes the following issues with the pessary:  The smaller ring would slip out to edge of vagina with work, she works at a housekeeper.  Pt removes regularly for sex and to clean.  ROS: she complains of slight bleeding with pessary,  Exam:   BP 129/79  Pulse 84  Resp 18  Ht 5\' 3"  (1.6 m)  Wt 119 lb (53.978 kg)  BMI 21.09 kg/m2 General appearance: alert, cooperative and appears stated age Inguinal adenopathy: absent   Pelvic: External genitalia:  no lesions              Urethra: normal appearing urethra with no masses, tenderness or lesions              Bartholins and Skenes: normal                 Vagina: normal appearing vagina with normal color and discharge, no lesions, small excoriation behind cervix              Cervix: normal appearance Bimanual Exam:  Uterus:  uterus is normal size, shape, consistency and nontender                               Adnexa:    no masses                               Anus:  defer exam  #5 pessary was placed with ease, pt ambulated around office with minimal shifting but remained within vagina.  Pt will be having hip replacement next week and would prefer to have the pessary for surgey and will return afterwards to have the excoriation evaluated, she will continue to use her estrogen. Patient tolerated procedure well.    A: Cystocele- symptomatic      Uterine prolapse  P:  Return to office in 1 months for recheck.           An After Visit Summary was printed and given to the patient.

## 2013-02-02 ENCOUNTER — Encounter (HOSPITAL_COMMUNITY): Payer: Self-pay | Admitting: Pharmacy Technician

## 2013-02-03 ENCOUNTER — Encounter (HOSPITAL_COMMUNITY)
Admission: RE | Admit: 2013-02-03 | Discharge: 2013-02-03 | Disposition: A | Discharge status: Home / Self Care | Payer: BC Managed Care – PPO | Source: Ambulatory Visit | Attending: Orthopaedic Surgery | Admitting: Orthopaedic Surgery

## 2013-02-03 ENCOUNTER — Encounter (HOSPITAL_COMMUNITY): Payer: Self-pay

## 2013-02-03 DIAGNOSIS — Z01818 Encounter for other preprocedural examination: Secondary | ICD-10-CM | POA: Insufficient documentation

## 2013-02-03 DIAGNOSIS — Z01812 Encounter for preprocedural laboratory examination: Secondary | ICD-10-CM | POA: Insufficient documentation

## 2013-02-03 LAB — BASIC METABOLIC PANEL
BUN: 15 mg/dL (ref 6–23)
CO2: 26 mEq/L (ref 19–32)
Calcium: 8.7 mg/dL (ref 8.4–10.5)
Chloride: 104 mEq/L (ref 96–112)
Creatinine, Ser: 0.83 mg/dL (ref 0.50–1.10)
GFR calc Af Amer: 86 mL/min — ABNORMAL LOW (ref >90)
GFR calc non Af Amer: 74 mL/min — ABNORMAL LOW (ref >90)
GLUCOSE: 108 mg/dL — AB (ref 70–99)
Potassium: 3.8 mEq/L (ref 3.7–5.3)
Sodium: 143 mEq/L (ref 137–147)

## 2013-02-03 LAB — URINALYSIS, ROUTINE W REFLEX MICROSCOPIC
GLUCOSE, UA: NEGATIVE mg/dL
HGB URINE DIPSTICK: NEGATIVE
KETONES UR: NEGATIVE mg/dL
Nitrite: NEGATIVE
PROTEIN: NEGATIVE mg/dL
Specific Gravity, Urine: 1.018 (ref 1.005–1.030)
UROBILINOGEN UA: 0.2 mg/dL (ref 0.0–1.0)
pH: 6.5 (ref 5.0–8.0)

## 2013-02-03 LAB — CBC
HCT: 38.8 % (ref 36.0–46.0)
HEMOGLOBIN: 13.6 g/dL (ref 12.0–15.0)
MCH: 31.6 pg (ref 26.0–34.0)
MCHC: 35.1 g/dL (ref 30.0–36.0)
MCV: 90.2 fL (ref 78.0–100.0)
Platelets: 237 10*3/uL (ref 150–400)
RBC: 4.3 MIL/uL (ref 3.87–5.11)
RDW: 12.9 % (ref 11.5–15.5)
WBC: 6.9 10*3/uL (ref 4.0–10.5)

## 2013-02-03 LAB — SURGICAL PCR SCREEN
MRSA, PCR: NEGATIVE
Staphylococcus aureus: NEGATIVE

## 2013-02-03 LAB — PROTIME-INR
INR: 0.94 (ref 0.00–1.49)
Prothrombin Time: 12.4 seconds (ref 11.6–15.2)

## 2013-02-03 LAB — ABO/RH: ABO/RH(D): B POS

## 2013-02-03 LAB — URINE MICROSCOPIC-ADD ON

## 2013-02-03 LAB — TYPE AND SCREEN
ABO/RH(D): B POS
Antibody Screen: NEGATIVE

## 2013-02-03 LAB — APTT: APTT: 28 s (ref 24–37)

## 2013-02-03 NOTE — Pre-Procedure Instructions (Signed)
EKG AND CXR NOT NEEDED PREOP PER ANETHESIOLOGIST'S GUIDELINES

## 2013-02-03 NOTE — Patient Instructions (Addendum)
PER [place] FLU POLICY  - NO VISITORS UNDER 62 YEARS OF AGE.   YOUR SURGERY IS SCHEDULED AT [facility]  ON:  Friday  2/6  REPORT TO  SHORT STAY CENTER AT:  5:30 AM      PHONE # FOR SHORT STAY IS [phone]  DO NOT EAT OR DRINK ANYTHING AFTER MIDNIGHT THE NIGHT BEFORE YOUR SURGERY.  YOU MAY BRUSH YOUR TEETH, RINSE OUT YOUR MOUTH--BUT NO WATER, NO FOOD, NO CHEWING GUM, NO MINTS, NO CANDIES, NO CHEWING TOBACCO.  PLEASE TAKE THE FOLLOWING MEDICATIONS THE AM OF YOUR SURGERY WITH A FEW SIPS OF WATER:  NO MEDS TO TAKE DAY OF SURGERY   DO NOT BRING VALUABLES, MONEY, CREDIT CARDS.  DO NOT WEAR JEWELRY, MAKE-UP, NAIL POLISH AND NO METAL PINS OR CLIPS IN YOUR HAIR. CONTACT LENS, DENTURES / PARTIALS, GLASSES SHOULD NOT BE WORN TO SURGERY AND IN MOST CASES-HEARING AIDS WILL NEED TO BE REMOVED.  BRING YOUR GLASSES CASE, ANY EQUIPMENT NEEDED FOR YOUR CONTACT LENS. FOR PATIENTS ADMITTED TO THE HOSPITAL--CHECK OUT TIME THE DAY OF DISCHARGE IS 11:00 AM.  ALL INPATIENT ROOMS ARE PRIVATE - WITH BATHROOM, TELEPHONE, TELEVISION AND WIFI INTERNET.                                                    PLEASE READ OVER ANY  FACT SHEETS THAT YOU WERE GIVEN: MRSA INFORMATION, BLOOD TRANSFUSION INFORMATION.  FAILURE TO FOLLOW THESE INSTRUCTIONS MAY RESULT IN THE CANCELLATION OF YOUR SURGERY. PLEASE BE AWARE THAT YOU MAY NEED ADDITIONAL BLOOD DRAWN DAY OF YOUR SURGERY  PATIENT SIGNATURE_________________________________

## 2013-02-04 ENCOUNTER — Ambulatory Visit: Payer: BC Managed Care – PPO | Admitting: Gynecology

## 2013-02-04 NOTE — Pre-Procedure Instructions (Signed)
PT'S URINALYSIS AND URINE MICROSCOPIC REPORTS WERE FAXED TO DR. C. BLACKMAN'S OFFICE FOR REVIEW OF ABNORMALS.

## 2013-02-09 ENCOUNTER — Encounter: Payer: Self-pay | Admitting: Gynecology

## 2013-02-11 ENCOUNTER — Inpatient Hospital Stay (HOSPITAL_COMMUNITY): Payer: BC Managed Care – PPO

## 2013-02-11 ENCOUNTER — Inpatient Hospital Stay (HOSPITAL_COMMUNITY): Payer: BC Managed Care – PPO | Admitting: Registered Nurse

## 2013-02-11 ENCOUNTER — Encounter (HOSPITAL_COMMUNITY): Payer: BC Managed Care – PPO | Admitting: Registered Nurse

## 2013-02-11 ENCOUNTER — Encounter (HOSPITAL_COMMUNITY): Payer: Self-pay | Admitting: *Deleted

## 2013-02-11 ENCOUNTER — Inpatient Hospital Stay (HOSPITAL_COMMUNITY)
Admission: RE | Admit: 2013-02-11 | Discharge: 2013-02-12 | DRG: 470 | Disposition: A | Discharge status: Home Health | Payer: BC Managed Care – PPO | Source: Ambulatory Visit | Attending: Orthopaedic Surgery | Admitting: Orthopaedic Surgery

## 2013-02-11 ENCOUNTER — Encounter (HOSPITAL_COMMUNITY)
Admission: RE | Disposition: A | Discharge status: Home Health | Payer: Self-pay | Source: Ambulatory Visit | Attending: Orthopaedic Surgery

## 2013-02-11 DIAGNOSIS — M1610 Unilateral primary osteoarthritis, unspecified hip: Principal | ICD-10-CM | POA: Diagnosis present

## 2013-02-11 DIAGNOSIS — M1611 Unilateral primary osteoarthritis, right hip: Secondary | ICD-10-CM

## 2013-02-11 DIAGNOSIS — Z96649 Presence of unspecified artificial hip joint: Secondary | ICD-10-CM

## 2013-02-11 DIAGNOSIS — M169 Osteoarthritis of hip, unspecified: Principal | ICD-10-CM | POA: Diagnosis present

## 2013-02-11 DIAGNOSIS — Z853 Personal history of malignant neoplasm of breast: Secondary | ICD-10-CM

## 2013-02-11 DIAGNOSIS — Z8249 Family history of ischemic heart disease and other diseases of the circulatory system: Secondary | ICD-10-CM

## 2013-02-11 DIAGNOSIS — Z01812 Encounter for preprocedural laboratory examination: Secondary | ICD-10-CM

## 2013-02-11 HISTORY — PX: TOTAL HIP ARTHROPLASTY: SHX124

## 2013-02-11 SURGERY — ARTHROPLASTY, HIP, TOTAL, ANTERIOR APPROACH
Anesthesia: General | Site: Hip | Laterality: Right

## 2013-02-11 MED ORDER — MEPERIDINE HCL 50 MG/ML IJ SOLN: 6.2500 mg | INTRAMUSCULAR | Status: DC | PRN

## 2013-02-11 MED ORDER — GLYCOPYRROLATE 0.2 MG/ML IJ SOLN
INTRAMUSCULAR | Status: DC | PRN
  Administered 2013-02-11: 0.2 mg via INTRAVENOUS
  Administered 2013-02-11: 0.4 mg via INTRAVENOUS

## 2013-02-11 MED ORDER — CHLORHEXIDINE GLUCONATE 4 % EX LIQD: 60.0000 mL | Freq: Once | CUTANEOUS | Status: DC

## 2013-02-11 MED ORDER — HYDROMORPHONE HCL PF 2 MG/ML IJ SOLN
INTRAMUSCULAR | Status: AC
  Filled 2013-02-11: qty 1

## 2013-02-11 MED ORDER — ONDANSETRON HCL 4 MG/2ML IJ SOLN
INTRAMUSCULAR | Status: DC | PRN
  Administered 2013-02-11: 4 mg via INTRAVENOUS

## 2013-02-11 MED ORDER — HYDROMORPHONE HCL PF 1 MG/ML IJ SOLN
INTRAMUSCULAR | Status: DC | PRN
  Administered 2013-02-11: 1 mg via INTRAVENOUS

## 2013-02-11 MED ORDER — CEFAZOLIN SODIUM-DEXTROSE 2-3 GM-% IV SOLR
2.0000 g | INTRAVENOUS | Status: AC
Start: 2013-02-11 — End: 2013-02-11
  Administered 2013-02-11: 2 g via INTRAVENOUS

## 2013-02-11 MED ORDER — ZOLPIDEM TARTRATE 5 MG PO TABS: 5.0000 mg | ORAL_TABLET | Freq: Every evening | ORAL | Status: DC | PRN

## 2013-02-11 MED ORDER — ACETAMINOPHEN 650 MG RE SUPP
650.0000 mg | Freq: Four times a day (QID) | RECTAL | Status: DC | PRN
Start: 2013-02-11 — End: 2013-02-12

## 2013-02-11 MED ORDER — STERILE WATER FOR IRRIGATION IR SOLN
Status: DC | PRN
  Administered 2013-02-11 (×2): 1500 mL

## 2013-02-11 MED ORDER — NEOSTIGMINE METHYLSULFATE 1 MG/ML IJ SOLN
INTRAMUSCULAR | Status: DC | PRN
  Administered 2013-02-11: 3 mg via INTRAVENOUS

## 2013-02-11 MED ORDER — SODIUM CHLORIDE 0.9 % IJ SOLN
INTRAMUSCULAR | Status: AC
  Filled 2013-02-11: qty 3

## 2013-02-11 MED ORDER — HYDROMORPHONE HCL PF 1 MG/ML IJ SOLN
0.2500 mg | INTRAMUSCULAR | Status: DC | PRN
  Administered 2013-02-11: 0.5 mg via INTRAVENOUS
  Administered 2013-02-11 (×3): 0.25 mg via INTRAVENOUS

## 2013-02-11 MED ORDER — CEFAZOLIN SODIUM 1-5 GM-% IV SOLN
1.0000 g | Freq: Three times a day (TID) | INTRAVENOUS | Status: AC
  Administered 2013-02-11 – 2013-02-12 (×2): 1 g via INTRAVENOUS
  Filled 2013-02-11 (×2): qty 50

## 2013-02-11 MED ORDER — KETOROLAC TROMETHAMINE 15 MG/ML IJ SOLN
15.0000 mg | Freq: Four times a day (QID) | INTRAMUSCULAR | Status: AC
  Administered 2013-02-11 – 2013-02-12 (×4): 15 mg via INTRAVENOUS
  Filled 2013-02-11 (×4): qty 1

## 2013-02-11 MED ORDER — DEXAMETHASONE SODIUM PHOSPHATE 10 MG/ML IJ SOLN
10.0000 mg | Freq: Three times a day (TID) | INTRAMUSCULAR | Status: DC
  Filled 2013-02-11 (×3): qty 1

## 2013-02-11 MED ORDER — FENTANYL CITRATE 0.05 MG/ML IJ SOLN
INTRAMUSCULAR | Status: AC
  Filled 2013-02-11: qty 2

## 2013-02-11 MED ORDER — ROCURONIUM BROMIDE 100 MG/10ML IV SOLN
INTRAVENOUS | Status: DC | PRN
  Administered 2013-02-11: 10 mg via INTRAVENOUS
  Administered 2013-02-11: 40 mg via INTRAVENOUS

## 2013-02-11 MED ORDER — EPHEDRINE SULFATE 50 MG/ML IJ SOLN
INTRAMUSCULAR | Status: AC
  Filled 2013-02-11: qty 1

## 2013-02-11 MED ORDER — FERROUS SULFATE 325 (65 FE) MG PO TABS
325.0000 mg | ORAL_TABLET | Freq: Three times a day (TID) | ORAL | Status: DC
  Administered 2013-02-11: 325 mg via ORAL
  Filled 2013-02-11 (×5): qty 1

## 2013-02-11 MED ORDER — ACETAMINOPHEN 10 MG/ML IV SOLN
1000.0000 mg | Freq: Once | INTRAVENOUS | Status: AC
  Administered 2013-02-11: 1000 mg via INTRAVENOUS
  Filled 2013-02-11: qty 100

## 2013-02-11 MED ORDER — ASPIRIN EC 325 MG PO TBEC
325.0000 mg | DELAYED_RELEASE_TABLET | Freq: Two times a day (BID) | ORAL | Status: DC
  Administered 2013-02-11 – 2013-02-12 (×2): 325 mg via ORAL
  Filled 2013-02-11 (×4): qty 1

## 2013-02-11 MED ORDER — DEXAMETHASONE 6 MG PO TABS
10.0000 mg | ORAL_TABLET | Freq: Three times a day (TID) | ORAL | Status: DC
  Administered 2013-02-11 (×2): 10 mg via ORAL
  Filled 2013-02-11 (×3): qty 1

## 2013-02-11 MED ORDER — ONDANSETRON HCL 4 MG PO TABS: 4.0000 mg | ORAL_TABLET | Freq: Four times a day (QID) | ORAL | Status: DC | PRN

## 2013-02-11 MED ORDER — FENTANYL CITRATE 0.05 MG/ML IJ SOLN
INTRAMUSCULAR | Status: DC | PRN
  Administered 2013-02-11 (×6): 50 ug via INTRAVENOUS

## 2013-02-11 MED ORDER — ACETAMINOPHEN 325 MG PO TABS: 650.0000 mg | ORAL_TABLET | Freq: Four times a day (QID) | ORAL | Status: DC | PRN

## 2013-02-11 MED ORDER — CHOLECALCIFEROL 10 MCG (400 UNIT) PO TABS
400.0000 [IU] | ORAL_TABLET | Freq: Every day | ORAL | Status: DC
  Filled 2013-02-11: qty 1

## 2013-02-11 MED ORDER — MIDAZOLAM HCL 5 MG/5ML IJ SOLN
INTRAMUSCULAR | Status: DC | PRN
  Administered 2013-02-11: 2 mg via INTRAVENOUS

## 2013-02-11 MED ORDER — SODIUM CHLORIDE 0.9 % IV SOLN
INTRAVENOUS | Status: DC | PRN
  Administered 2013-02-11: 1000 mL via INTRAMUSCULAR

## 2013-02-11 MED ORDER — DOCUSATE SODIUM 100 MG PO CAPS
100.0000 mg | ORAL_CAPSULE | Freq: Two times a day (BID) | ORAL | Status: DC
Start: 2013-02-11 — End: 2013-02-12

## 2013-02-11 MED ORDER — ONDANSETRON HCL 4 MG/2ML IJ SOLN
4.0000 mg | Freq: Four times a day (QID) | INTRAMUSCULAR | Status: DC | PRN
  Administered 2013-02-11: 4 mg via INTRAVENOUS
  Filled 2013-02-11: qty 2

## 2013-02-11 MED ORDER — HYDROMORPHONE HCL PF 1 MG/ML IJ SOLN
INTRAMUSCULAR | Status: AC
  Filled 2013-02-11: qty 1

## 2013-02-11 MED ORDER — METOCLOPRAMIDE HCL 10 MG PO TABS: 5.0000 mg | ORAL_TABLET | Freq: Three times a day (TID) | ORAL | Status: DC | PRN

## 2013-02-11 MED ORDER — POTASSIUM 99 MG PO TABS: 99.0000 mg | ORAL_TABLET | Freq: Every day | ORAL | Status: DC

## 2013-02-11 MED ORDER — CEFAZOLIN SODIUM-DEXTROSE 2-3 GM-% IV SOLR
INTRAVENOUS | Status: AC
  Filled 2013-02-11: qty 50

## 2013-02-11 MED ORDER — METHOCARBAMOL 500 MG PO TABS
500.0000 mg | ORAL_TABLET | Freq: Four times a day (QID) | ORAL | Status: DC | PRN
  Administered 2013-02-12: 500 mg via ORAL
  Filled 2013-02-11: qty 1

## 2013-02-11 MED ORDER — METOCLOPRAMIDE HCL 5 MG/ML IJ SOLN
5.0000 mg | Freq: Three times a day (TID) | INTRAMUSCULAR | Status: DC | PRN
  Administered 2013-02-11: 10 mg via INTRAVENOUS
  Filled 2013-02-11: qty 2

## 2013-02-11 MED ORDER — GLYCOPYRROLATE 0.2 MG/ML IJ SOLN
INTRAMUSCULAR | Status: AC
Start: 2013-02-11 — End: 2013-02-11
  Filled 2013-02-11: qty 1

## 2013-02-11 MED ORDER — FENTANYL CITRATE 0.05 MG/ML IJ SOLN
INTRAMUSCULAR | Status: AC
  Filled 2013-02-11: qty 5

## 2013-02-11 MED ORDER — LACTATED RINGERS IV SOLN
INTRAVENOUS | Status: DC | PRN
  Administered 2013-02-11: 07:00:00 via INTRAVENOUS

## 2013-02-11 MED ORDER — PHENOL 1.4 % MT LIQD: 1.0000 | OROMUCOSAL | Status: DC | PRN

## 2013-02-11 MED ORDER — POLYETHYLENE GLYCOL 3350 17 G PO PACK: 17.0000 g | PACK | Freq: Every day | ORAL | Status: DC | PRN

## 2013-02-11 MED ORDER — HYDROMORPHONE HCL PF 1 MG/ML IJ SOLN: 1.0000 mg | INTRAMUSCULAR | Status: DC | PRN

## 2013-02-11 MED ORDER — METHOCARBAMOL 100 MG/ML IJ SOLN
500.0000 mg | Freq: Four times a day (QID) | INTRAVENOUS | Status: DC | PRN
  Administered 2013-02-11: 500 mg via INTRAVENOUS
  Filled 2013-02-11: qty 5

## 2013-02-11 MED ORDER — SODIUM CHLORIDE 0.9 % IV SOLN
INTRAVENOUS | Status: DC
  Administered 2013-02-11 – 2013-02-12 (×2): via INTRAVENOUS

## 2013-02-11 MED ORDER — PROMETHAZINE HCL 25 MG/ML IJ SOLN: 6.2500 mg | INTRAMUSCULAR | Status: DC | PRN

## 2013-02-11 MED ORDER — TRANEXAMIC ACID 100 MG/ML IV SOLN
1000.0000 mg | INTRAVENOUS | Status: AC
  Administered 2013-02-11: 1000 mg via INTRAVENOUS
  Filled 2013-02-11: qty 10

## 2013-02-11 MED ORDER — DEXAMETHASONE SODIUM PHOSPHATE 10 MG/ML IJ SOLN
INTRAMUSCULAR | Status: AC
  Filled 2013-02-11: qty 1

## 2013-02-11 MED ORDER — LIDOCAINE HCL (CARDIAC) 20 MG/ML IV SOLN
INTRAVENOUS | Status: DC | PRN
  Administered 2013-02-11: 70 mg via INTRAVENOUS

## 2013-02-11 MED ORDER — MENTHOL 3 MG MT LOZG
1.0000 | LOZENGE | OROMUCOSAL | Status: DC | PRN
  Filled 2013-02-11: qty 9

## 2013-02-11 MED ORDER — FENTANYL CITRATE 0.05 MG/ML IJ SOLN: 25.0000 ug | INTRAMUSCULAR | Status: DC | PRN

## 2013-02-11 MED ORDER — PROPOFOL 10 MG/ML IV BOLUS
INTRAVENOUS | Status: DC | PRN
  Administered 2013-02-11: 150 mg via INTRAVENOUS

## 2013-02-11 MED ORDER — LIDOCAINE HCL (CARDIAC) 20 MG/ML IV SOLN
INTRAVENOUS | Status: AC
  Filled 2013-02-11: qty 5

## 2013-02-11 MED ORDER — LACTATED RINGERS IV SOLN
INTRAVENOUS | Status: DC
  Administered 2013-02-11: 1000 mL via INTRAVENOUS

## 2013-02-11 MED ORDER — ONDANSETRON HCL 4 MG/2ML IJ SOLN
INTRAMUSCULAR | Status: AC
  Filled 2013-02-11: qty 2

## 2013-02-11 MED ORDER — OXYCODONE HCL 5 MG PO TABS
5.0000 mg | ORAL_TABLET | ORAL | Status: DC | PRN
  Administered 2013-02-11 – 2013-02-12 (×4): 5 mg via ORAL
  Filled 2013-02-11 (×4): qty 1

## 2013-02-11 MED ORDER — VITAMIN D 400 UNITS PO TABS: 400.0000 [IU] | ORAL_TABLET | Freq: Every day | ORAL | Status: DC

## 2013-02-11 MED ORDER — SODIUM CHLORIDE 0.9 % IJ SOLN
INTRAMUSCULAR | Status: AC
  Filled 2013-02-11: qty 10

## 2013-02-11 MED ORDER — PROPOFOL 10 MG/ML IV BOLUS
INTRAVENOUS | Status: AC
  Filled 2013-02-11: qty 20

## 2013-02-11 MED ORDER — ALUM & MAG HYDROXIDE-SIMETH 200-200-20 MG/5ML PO SUSP: 30.0000 mL | ORAL | Status: DC | PRN

## 2013-02-11 MED ORDER — DIPHENHYDRAMINE HCL 12.5 MG/5ML PO ELIX: 12.5000 mg | ORAL_SOLUTION | ORAL | Status: DC | PRN

## 2013-02-11 MED ORDER — MIDAZOLAM HCL 2 MG/2ML IJ SOLN
INTRAMUSCULAR | Status: AC
Start: 2013-02-11 — End: 2013-02-11
  Filled 2013-02-11: qty 2

## 2013-02-11 MED ORDER — DEXAMETHASONE SODIUM PHOSPHATE 10 MG/ML IJ SOLN
INTRAMUSCULAR | Status: DC | PRN
  Administered 2013-02-11: 10 mg via INTRAVENOUS

## 2013-02-11 SURGICAL SUPPLY — 38 items
BAG ZIPLOCK 12X15 (MISCELLANEOUS) IMPLANT
BENZOIN TINCTURE PRP APPL 2/3 (GAUZE/BANDAGES/DRESSINGS) ×2 IMPLANT
BLADE SAW SGTL 18X1.27X75 (BLADE) ×2 IMPLANT
CAPT HIP PF COP ×2 IMPLANT
CELLS DAT CNTRL 66122 CELL SVR (MISCELLANEOUS) ×1 IMPLANT
DRAPE C-ARM 42X120 X-RAY (DRAPES) ×2 IMPLANT
DRAPE STERI IOBAN 125X83 (DRAPES) ×2 IMPLANT
DRAPE U-SHAPE 47X51 STRL (DRAPES) ×6 IMPLANT
DRSG AQUACEL AG ADV 3.5X10 (GAUZE/BANDAGES/DRESSINGS) ×2 IMPLANT
DURAPREP 26ML APPLICATOR (WOUND CARE) ×2 IMPLANT
ELECT BLADE TIP CTD 4 INCH (ELECTRODE) ×2 IMPLANT
ELECT REM PT RETURN 9FT ADLT (ELECTROSURGICAL) ×2
ELECTRODE REM PT RTRN 9FT ADLT (ELECTROSURGICAL) ×1 IMPLANT
FACESHIELD LNG OPTICON STERILE (SAFETY) ×8 IMPLANT
GAUZE XEROFORM 5X9 LF (GAUZE/BANDAGES/DRESSINGS) IMPLANT
GLOVE BIO SURGEON STRL SZ7.5 (GLOVE) ×2 IMPLANT
GLOVE BIOGEL PI IND STRL 8 (GLOVE) ×2 IMPLANT
GLOVE BIOGEL PI INDICATOR 8 (GLOVE) ×2
GLOVE ECLIPSE 8.0 STRL XLNG CF (GLOVE) ×2 IMPLANT
GOWN STRL REUS W/TWL XL LVL3 (GOWN DISPOSABLE) ×4 IMPLANT
HANDPIECE INTERPULSE COAX TIP (DISPOSABLE) ×1
KIT BASIN OR (CUSTOM PROCEDURE TRAY) ×2 IMPLANT
PACK TOTAL JOINT (CUSTOM PROCEDURE TRAY) ×2 IMPLANT
PADDING CAST COTTON 6X4 STRL (CAST SUPPLIES) ×2 IMPLANT
RTRCTR WOUND ALEXIS 18CM MED (MISCELLANEOUS) ×2
SET HNDPC FAN SPRY TIP SCT (DISPOSABLE) ×1 IMPLANT
STAPLER VISISTAT 35W (STAPLE) IMPLANT
STRIP CLOSURE SKIN 1/2X4 (GAUZE/BANDAGES/DRESSINGS) ×2 IMPLANT
SUT ETHIBOND NAB CT1 #1 30IN (SUTURE) ×2 IMPLANT
SUT ETHILON 3 0 PS 1 (SUTURE) IMPLANT
SUT MNCRL AB 4-0 PS2 18 (SUTURE) ×2 IMPLANT
SUT VIC AB 0 CT1 36 (SUTURE) ×2 IMPLANT
SUT VIC AB 1 CT1 36 (SUTURE) ×2 IMPLANT
SUT VIC AB 2-0 CT1 27 (SUTURE) ×1
SUT VIC AB 2-0 CT1 TAPERPNT 27 (SUTURE) ×1 IMPLANT
TOWEL OR 17X26 10 PK STRL BLUE (TOWEL DISPOSABLE) ×2 IMPLANT
TOWEL OR NON WOVEN STRL DISP B (DISPOSABLE) ×2 IMPLANT
TRAY FOLEY CATH 14FRSI W/METER (CATHETERS) IMPLANT

## 2013-02-11 NOTE — H&P (Signed)
TOTAL HIP ADMISSION H&P  Patient is admitted for right total hip arthroplasty.  Subjective:  Chief Complaint: right hip pain  HPI: Tara Fernandez, 63 y.o. female, has a history of pain and functional disability in the right hip(s) due to arthritis and patient has failed non-surgical conservative treatments for greater than 12 weeks to include NSAID's and/or analgesics, corticosteriod injections, use of assistive devices and activity modification.  Onset of symptoms was gradual starting 3 years ago with gradually worsening course since that time.The patient noted no past surgery on the right hip(s).  Patient currently rates pain in the right hip at 8 out of 10 with activity. Patient has night pain, worsening of pain with activity and weight bearing, trendelenberg gait, pain that interfers with activities of daily living and pain with passive range of motion. Patient has evidence of joint space narrowing, cystic changes, periarticular osteophytes, and sclerosis by imaging studies. This condition presents safety issues increasing the risk of falls.  There is no current active infection.  Patient Active Problem List   Diagnosis Date Noted  . Arthritis of right hip 02/11/2013  . Cystocele 11/19/2012  . Uterine prolapse 11/19/2012  . Degenerative arthritis of hip 06/17/2012   Past Medical History  Diagnosis Date  . Arthritis     osteoarthritis; PAIN RIGHT HIP  . Vaginal pessary present     PT HAS SMALL BLOOD BLISTER  - UP INSIDE VAGINA - PT'S GYN DOCTOR- DR LATHROP- AWARE -TOLD TO USE PREMARIN VAGINAL CREAM.  PT DOES NOT HAVE VAGINAL BLEEDING    Past Surgical History  Procedure Laterality Date  . Breast mass excision      Hx: of left breast  . Total hip arthroplasty Left 06/17/2012    Procedure: LEFT TOTAL HIP ARTHROPLASTY ANTERIOR APPROACH;  Surgeon: Mcarthur Rossetti, MD;  Location: [facility];  Service: Orthopedics;  Laterality: Left;  . Cartilage surgery  10 th grade    RIGHT KNEE     Prescriptions prior to admission  Medication Sig Dispense Refill  . Cholecalciferol (VITAMIN D PO) Take 10,000 Units by mouth daily.      Marland Kitchen conjugated estrogens (PREMARIN) vaginal cream Place 1 Applicatorful vaginally 2 (two) times a week. Monday and Friday      . etodolac (LODINE) 400 MG tablet Take 400 mg by mouth 2 (two) times daily as needed.       Marland Kitchen EVENING PRIMROSE OIL PO Take 1,300 mg by mouth daily.      . Glucosamine HCl (GLUCOSAMINE PO) Take 1 tablet by mouth daily.      Marland Kitchen MAGNESIUM PO Take 1 tablet by mouth daily.      . NON FORMULARY daily. Marshmallow   HERB      . Nutritional Supplements (DHEA PO) Take 1 tablet by mouth daily.      Marland Kitchen OVER THE COUNTER MEDICATION Take 1 tablet by mouth daily. Med: positive energy      . OVER THE COUNTER MEDICATION Apply 1 application topically every other day. Over the counter progesterone cream      . Potassium 99 MG TABS Take 99 mg by mouth daily.       Allergies  Allergen Reactions  . Tramadol Other (See Comments)    Causes numbness in hands and right side of body  . Chlorhexidine Itching  . Other     PT STATES SHE CAN TAKE HYDROCODONE/ACETAMINOPHEN FOR PAIN  WITH OUT ANY PROBLEMS.  STATES - IN THE PAST - OTHER PAIN MEDS  CAUSED NAUSEA OR RASH OR FAST HEART BEAT - BUT SHE DOESN'T KNOW NAMES OF THOSE MEDS   STATES NO PROBLEMS WITH ANY PAIN MEDS GIVEN IN HOSPITAL.  Marland Kitchen Latex Rash    HIVES - AFTER DENTIST USED LATEX GLOVES    History  Substance Use Topics  . Smoking status: Never Smoker   . Smokeless tobacco: Never Used  . Alcohol Use: Yes     Comment: occasional WINE    Family History  Problem Relation Age of Onset  . Heart attack Father   . Cancer - Lung Sister   . Breast cancer Sister 46  . Cancer - Lung Mother      Review of Systems  Musculoskeletal: Positive for joint pain.  All other systems reviewed and are negative.    Objective:  Physical Exam  Constitutional: She is oriented to person, place, and time. She appears  well-developed and well-nourished.  HENT:  Head: Normocephalic and atraumatic.  Eyes: EOM are normal. Pupils are equal, round, and reactive to light.  Neck: Normal range of motion. Neck supple.  Cardiovascular: Normal rate and regular rhythm.   Respiratory: Effort normal and breath sounds normal.  GI: Soft. Bowel sounds are normal.  Musculoskeletal:       Right hip: She exhibits decreased range of motion, decreased strength and bony tenderness.  Neurological: She is alert and oriented to person, place, and time.  Skin: Skin is warm and dry.  Psychiatric: She has a normal mood and affect.    Vital signs in last 24 hours:    Labs:   Estimated body mass index is 20.73 kg/(m^2) as calculated from the following:   Height as of 11/03/12: 5\' 3"  (1.6 m).   Weight as of 11/19/12: 53.071 kg (117 lb).   Imaging Review Plain radiographs demonstrate severe degenerative joint disease of the right hip(s). The bone quality appears to be excellent for age and reported activity level.  Assessment/Plan:  End stage arthritis, right hip(s)  The patient history, physical examination, clinical judgement of the provider and imaging studies are consistent with end stage degenerative joint disease of the right hip(s) and total hip arthroplasty is deemed medically necessary. The treatment options including medical management, injection therapy, arthroscopy and arthroplasty were discussed at length. The risks and benefits of total hip arthroplasty were presented and reviewed. The risks due to aseptic loosening, infection, stiffness, dislocation/subluxation,  thromboembolic complications and other imponderables were discussed.  The patient acknowledged the explanation, agreed to proceed with the plan and consent was signed. Patient is being admitted for inpatient treatment for surgery, pain control, PT, OT, prophylactic antibiotics, VTE prophylaxis, progressive ambulation and ADL's and discharge planning.The  patient is planning to be discharged home with home health services

## 2013-02-11 NOTE — Brief Op Note (Signed)
02/11/2013  8:49 AM  PATIENT:  Tara Fernandez  63 y.o. female  PRE-OPERATIVE DIAGNOSIS:  Right hip arthritis  POST-OPERATIVE DIAGNOSIS:  Right hip arthritis  PROCEDURE:  Procedure(s): RIGHT TOTAL HIP ARTHROPLASTY ANTERIOR APPROACH (Right)  SURGEON:  Surgeon(s) and Role:    * Mcarthur Rossetti, MD - Primary  PHYSICIAN ASSISTANT: Benita Stabile, PA-c  ANESTHESIA:   general  EBL:  Total I/O In: 1000 [I.V.:1000] Out: 400 [Urine:200; Blood:200]  BLOOD ADMINISTERED:none  DRAINS: none   LOCAL MEDICATIONS USED:  NONE  SPECIMEN:  No Specimen  DISPOSITION OF SPECIMEN:  N/A  COUNTS:  YES  TOURNIQUET:  * No tourniquets in log *  DICTATION: .Other Dictation: Dictation Number [id number]  PLAN OF CARE: Admit to inpatient   PATIENT DISPOSITION:  PACU - hemodynamically stable.   Delay start of Pharmacological VTE agent (>24hrs) due to surgical blood loss or risk of bleeding: no

## 2013-02-11 NOTE — Anesthesia Preprocedure Evaluation (Addendum)
Anesthesia Evaluation  Patient identified by MRN, date of birth, ID band Patient awake    Reviewed: Allergy & Precautions, H&P , NPO status , Patient's Chart, lab work & pertinent test results  Airway Mallampati: II TM Distance: >3 FB Neck ROM: Full    Dental no notable dental hx.    Pulmonary neg pulmonary ROS,  breath sounds clear to auscultation  Pulmonary exam normal       Cardiovascular negative cardio ROS  Rhythm:Regular Rate:Normal     Neuro/Psych negative neurological ROS  negative psych ROS   GI/Hepatic negative GI ROS, Neg liver ROS,   Endo/Other  negative endocrine ROS  Renal/GU negative Renal ROS  negative genitourinary   Musculoskeletal negative musculoskeletal ROS (+)   Abdominal   Peds negative pediatric ROS (+)  Hematology negative hematology ROS (+)   Anesthesia Other Findings Upper front caps   Reproductive/Obstetrics negative OB ROS                           Anesthesia Physical Anesthesia Plan  ASA: II  Anesthesia Plan: General   Post-op Pain Management:    Induction: Intravenous  Airway Management Planned: Oral ETT and LMA  Additional Equipment:   Intra-op Plan:   Post-operative Plan: Extubation in OR  Informed Consent: I have reviewed the patients History and Physical, chart, labs and discussed the procedure including the risks, benefits and alternatives for the proposed anesthesia with the patient or authorized representative who has indicated his/her understanding and acceptance.   Dental advisory given  Plan Discussed with: CRNA  Anesthesia Plan Comments:         Anesthesia Quick Evaluation

## 2013-02-11 NOTE — Plan of Care (Signed)
Problem: Consults Goal: Diagnosis- Total Joint Replacement Right anterior total hip     

## 2013-02-11 NOTE — Preoperative (Signed)
Beta Blockers   Reason not to administer Beta Blockers:Not Applicable 

## 2013-02-11 NOTE — Evaluation (Signed)
Physical Therapy Evaluation Patient Details Name: Tara Fernandez MRN: [id number] DOB: 07-14-1950 Today's Date: 02/11/2013 Time: 7169-6789 PT Time Calculation (min): 31 min  PT Assessment / Plan / Recommendation History of Present Illness     Clinical Impression  Pt s/p R THR presents with decreased R LE strength/ROM and post op pain limiting functional mobility.  Pt should progress well to d/c home with family assist and HHPT follow up.    PT Assessment  Patient needs continued PT services    Follow Up Recommendations  Home health PT    Does the patient have the potential to tolerate intense rehabilitation      Barriers to Discharge        Equipment Recommendations  None recommended by PT    Recommendations for Other Services OT consult   Frequency 7X/week    Precautions / Restrictions Precautions Precautions: Fall Restrictions Weight Bearing Restrictions: No Other Position/Activity Restrictions: WBAT   Pertinent Vitals/Pain 4/10; premed, ice pack provided      Mobility  Bed Mobility Overal bed mobility: Needs Assistance Bed Mobility: Supine to Sit;Sit to Supine Supine to sit: Min assist Sit to supine: Min assist General bed mobility comments: cues for sequence and use of L LE to self assist Transfers Overall transfer level: Needs assistance Equipment used: Rolling walker (2 wheeled) Transfers: Sit to/from Stand Sit to Stand: Min assist;Mod assist General transfer comment: cues for LE management and use of UEs to self assist Ambulation/Gait Ambulation/Gait assistance: Min assist Ambulation Distance (Feet): 175 Feet Assistive device: Rolling walker (2 wheeled) Gait Pattern/deviations: Step-to pattern;Step-through pattern;Shuffle;Trunk flexed General Gait Details: cues for posture, position from RW, ER on R and initial sequencing    Exercises Total Joint Exercises Ankle Circles/Pumps: AROM;Both;15 reps;Supine Quad Sets: AROM;Both;10 reps;Supine Heel Slides:  AAROM;Right;15 reps;Supine Hip ABduction/ADduction: AAROM;Right;15 reps;Supine   PT Diagnosis: Difficulty walking  PT Problem List: Decreased strength;Decreased range of motion;Decreased activity tolerance;Decreased mobility;Decreased knowledge of use of DME;Pain PT Treatment Interventions: DME instruction;Gait training;Stair training;Functional mobility training;Therapeutic activities;Therapeutic exercise;Patient/family education     PT Goals(Current goals can be found in the care plan section) Acute Rehab PT Goals Patient Stated Goal: Resume previous lifestyle with decreased pain PT Goal Formulation: With patient Time For Goal Achievement: 02/18/13 Potential to Achieve Goals: Good  Visit Information  Last PT Received On: 02/11/13 Assistance Needed: +1       Prior [place] expects to be discharged to:: Private residence Living Arrangements: Spouse/significant other Available Help at Discharge: Available 24 hours/day;Family;Friend(s) Type of Home: House Home Access: Stairs to enter [employer] of Steps: 2 Entrance Stairs-Rails: Left Home Layout: One level Home Equipment: [place] - 2 wheels;Cane - single point;Bedside commode Prior Function Level of Independence: Independent Communication Communication: No difficulties    Cognition  Cognition Arousal/Alertness: Awake/alert Behavior During Therapy: WFL for tasks assessed/performed Overall Cognitive Status: Within Functional Limits for tasks assessed    Extremity/Trunk Assessment Upper Extremity Assessment Upper Extremity Assessment: Overall WFL for tasks assessed Lower Extremity Assessment Lower Extremity Assessment: RLE deficits/detail RLE Deficits / Details: hip strength 2+/5 with AAROM at hip to 70 flex and 10 abd Cervical / Trunk Assessment Cervical / Trunk Assessment: Normal   Balance    End of Session PT - End of Session Equipment Utilized During Treatment: Gait  belt Activity Tolerance: Patient tolerated treatment well Patient left: in bed;with call bell/phone within reach;with family/visitor present Nurse Communication: Mobility status  GP     Tara Fernandez 02/11/2013, 5:11 PM

## 2013-02-11 NOTE — Progress Notes (Signed)
Utilization review completed.  

## 2013-02-11 NOTE — Anesthesia Postprocedure Evaluation (Signed)
  Anesthesia Post-op Note  Patient: Tara Fernandez  Procedure(s) Performed: Procedure(s) (LRB): RIGHT TOTAL HIP ARTHROPLASTY ANTERIOR APPROACH (Right)  Patient Location: PACU  Anesthesia Type: General  Level of Consciousness: awake and alert   Airway and Oxygen Therapy: Patient Spontanous Breathing  Post-op Pain: mild  Post-op Assessment: Post-op Vital signs reviewed, Patient's Cardiovascular Status Stable, Respiratory Function Stable, Patent Airway and No signs of Nausea or vomiting  Last Vitals:  Filed Vitals:   02/11/13 1417  BP: 107/62  Pulse: 70  Temp: 36.6 C  Resp: 16    Post-op Vital Signs: stable   Complications: No apparent anesthesia complications

## 2013-02-11 NOTE — Care Management Note (Signed)
  Page 1 of 1   02/11/2013     5:07:13 PM   CARE MANAGEMENT NOTE 02/11/2013  Patient:  OMUNIQUE, PEDERSON   Account Number:  [id number]  Date Initiated:  02/11/2013  Documentation initiated by:  Sherrin Daisy  Subjective/Objective Assessment:   dx rt hip arthroplasty     Action/Plan:   CM spoke with patient. Plans are for her to return to her home in [place] where spouse will be caregiver. She already has DME. She is requesting specific physical therapist that works for [place].   Anticipated DC Date:  02/13/2013   Anticipated DC Plan:  [place]  CM consult      [facility] Choice  HOME HEALTH   Choice offered to / List presented to:  C-1 Patient        [place] arranged  HH-2 PT      [place].   Status of service:  In process, will continue to follow Medicare Important Message given?   (If response is "NO", the following Medicare IM given date fields will be blank) Date Medicare IM given:   Date Additional Medicare IM given:    Discharge Disposition:    Per UR Regulation:    If discussed at Long Length of Stay Meetings, dates discussed:    Comments:  02/11/2013 Sherrin Daisy BSN RN CCM [phone] [facility] rep advised of pt's request and can provide services.

## 2013-02-11 NOTE — Transfer of Care (Signed)
Immediate Anesthesia Transfer of Care Note  Patient: Tara Fernandez  Procedure(s) Performed: Procedure(s): RIGHT TOTAL HIP ARTHROPLASTY ANTERIOR APPROACH (Right)  Patient Location: PACU  Anesthesia Type:General  Level of Consciousness: awake, alert , oriented and patient cooperative  Airway & Oxygen Therapy: Patient Spontanous Breathing and Patient connected to face mask oxygen  Post-op Assessment: Report given to PACU RN, Post -op Vital signs reviewed and stable and Patient moving all extremities  Post vital signs: Reviewed and stable  Complications: No apparent anesthesia complications

## 2013-02-12 LAB — CBC
HCT: 33.5 % — ABNORMAL LOW (ref 36.0–46.0)
Hemoglobin: 11.5 g/dL — ABNORMAL LOW (ref 12.0–15.0)
MCH: 31 pg (ref 26.0–34.0)
MCHC: 34.3 g/dL (ref 30.0–36.0)
MCV: 90.3 fL (ref 78.0–100.0)
PLATELETS: 208 10*3/uL (ref 150–400)
RBC: 3.71 MIL/uL — ABNORMAL LOW (ref 3.87–5.11)
RDW: 12.8 % (ref 11.5–15.5)
WBC: 11.4 10*3/uL — ABNORMAL HIGH (ref 4.0–10.5)

## 2013-02-12 LAB — BASIC METABOLIC PANEL WITH GFR
BUN: 9 mg/dL (ref 6–23)
CO2: 25 meq/L (ref 19–32)
Calcium: 8.1 mg/dL — ABNORMAL LOW (ref 8.4–10.5)
Chloride: 102 meq/L (ref 96–112)
Creatinine, Ser: 0.63 mg/dL (ref 0.50–1.10)
GFR calc Af Amer: >90 mL/min
GFR calc non Af Amer: >90 mL/min
Glucose, Bld: 182 mg/dL — ABNORMAL HIGH (ref 70–99)
Potassium: 4.5 meq/L (ref 3.7–5.3)
Sodium: 137 meq/L (ref 137–147)

## 2013-02-12 MED ORDER — ASPIRIN 325 MG PO TBEC: 325.0000 mg | DELAYED_RELEASE_TABLET | Freq: Two times a day (BID) | ORAL | Status: DC

## 2013-02-12 NOTE — Discharge Summary (Signed)
Patient ID: Tara Fernandez MRN: [id number] DOB/AGE: Sep 28, 1950 63 y.o.  Admit date: 02/11/2013 Discharge date: 02/12/2013  Admission Diagnoses:  Principal Problem:   Arthritis of right hip Active Problems:   Status post THR (total hip replacement)   Discharge Diagnoses:  Same  Past Medical History  Diagnosis Date  . Arthritis     osteoarthritis; PAIN RIGHT HIP  . Vaginal pessary present     PT HAS SMALL BLOOD BLISTER  - UP INSIDE VAGINA - PT'S GYN DOCTOR- DR LATHROP- AWARE -TOLD TO USE PREMARIN VAGINAL CREAM.  PT DOES NOT HAVE VAGINAL BLEEDING    Surgeries: Procedure(s): RIGHT TOTAL HIP ARTHROPLASTY ANTERIOR APPROACH on 02/11/2013   Consultants:    Discharged Condition: Improved  Hospital Course: Tara Fernandez is an 63 y.o. female who was admitted 02/11/2013 for operative treatment ofArthritis of right hip. Patient has severe unremitting pain that affects sleep, daily activities, and work/hobbies. After pre-op clearance the patient was taken to the operating room on 02/11/2013 and underwent  Procedure(s): RIGHT TOTAL HIP ARTHROPLASTY ANTERIOR APPROACH.    Patient was given perioperative antibiotics: Anti-infectives   Start     Dose/Rate Route Frequency Ordered Stop   02/11/13 1800  ceFAZolin (ANCEF) IVPB 1 g/50 mL premix     1 g 100 mL/hr over 30 Minutes Intravenous 3 times per day 02/11/13 1734 02/12/13 0232   02/11/13 0539  ceFAZolin (ANCEF) IVPB 2 g/50 mL premix     2 g 100 mL/hr over 30 Minutes Intravenous On call to O.R. 02/11/13 4627 02/11/13 0748       Patient was given sequential compression devices, early ambulation, and chemoprophylaxis to prevent DVT.  Patient benefited maximally from hospital stay and there were no complications.    Recent vital signs: Patient Vitals for the past 24 hrs:  BP Temp Temp src Pulse Resp SpO2  02/12/13 1000 103/65 mmHg 98.3 F (36.8 C) Oral 79 16 99 %  02/12/13 0800 - - - - 16 100 %  02/12/13 0646 90/52 mmHg 97.9 F (36.6 C) Oral  66 16 100 %  02/12/13 0400 - - - - 16 100 %  02/12/13 0152 100/62 mmHg 98.1 F (36.7 C) Oral 68 16 100 %  02/12/13 0000 - - - - 18 100 %  02/11/13 2220 95/55 mmHg 98 F (36.7 C) Oral 66 16 100 %  02/11/13 2000 - - - - 18 100 %  02/11/13 1735 108/68 mmHg 98.3 F (36.8 C) Oral 64 16 100 %  02/11/13 1417 107/62 mmHg 97.8 F (36.6 C) - 70 16 100 %  02/11/13 1300 90/50 mmHg 97.1 F (36.2 C) - 80 16 100 %  02/11/13 1203 113/66 mmHg 97.7 F (36.5 C) - 53 16 100 %     Recent laboratory studies:  Recent Labs  02/12/13 0429  WBC 11.4*  HGB 11.5*  HCT 33.5*  PLT 208  NA 137  K 4.5  CL 102  CO2 25  BUN 9  CREATININE 0.63  GLUCOSE 182*  CALCIUM 8.1*     Discharge Medications:     Medication List    STOP taking these medications       etodolac 400 MG tablet  Commonly known as:  LODINE      TAKE these medications       aspirin 325 MG EC tablet  Take 1 tablet (325 mg total) by mouth 2 (two) times daily after a meal.     conjugated estrogens vaginal cream  Commonly known as:  PREMARIN  Place 1 Applicatorful vaginally 2 (two) times a week. Monday and Friday     DHEA PO  Take 1 tablet by mouth daily.     EVENING PRIMROSE OIL PO  Take 1,300 mg by mouth daily.     GLUCOSAMINE PO  Take 1 tablet by mouth daily.     MAGNESIUM PO  Take 1 tablet by mouth daily.     NON FORMULARY  daily. Marshmallow   HERB     OVER THE COUNTER MEDICATION  Take 1 tablet by mouth daily. Med: positive energy     OVER THE COUNTER MEDICATION  Apply 1 application topically every other day. Over the counter progesterone cream     Potassium 99 MG Tabs  Take 99 mg by mouth daily.     VITAMIN D PO  Take 10,000 Units by mouth daily.        Diagnostic Studies: Dg Pelvis Portable  02/11/2013   CLINICAL DATA:  Postop right total hip arthroplasty via anterior approach.  EXAM: PORTABLE PELVIS 1-2 VIEWS  COMPARISON:  Portable lateral right hip x-ray obtained concurrently. Portable pelvis  x-ray 06/17/2012.  FINDINGS: Anatomic alignment in the AP projection post right hip arthroplasty. No acute complicating features. Calcification adjacent to the roof of the acetabulum again noted and unchanged from the prior examination.  IMPRESSION: Anatomic alignment post right hip arthroplasty without acute complicating features.   Electronically Signed   By: Evangeline Dakin M.D.   On: 02/11/2013 11:47   Dg Hip Portable 1 View Right  02/11/2013   CLINICAL DATA:  Right total hip arthroplasty via anterior approach.  EXAM: PORTABLE RIGHT HIP - 1 VIEW  COMPARISON:  Portable pelvis x-ray obtained concurrently.  FINDINGS: Portable cross-table lateral examination was obtained. Anatomic alignment of the right hip arthroplasty in the lateral projection.  IMPRESSION: Anatomic alignment of the right hip arthroplasty in the lateral projection.   Electronically Signed   By: Evangeline Dakin M.D.   On: 02/11/2013 11:45   Dg C-arm 1-60 Min-no Report  02/11/2013   CLINICAL DATA: Right total hip replacement   C-ARM 1-60 MINUTES  Fluoroscopy was utilized by the requesting physician.  No radiographic  interpretation.     Disposition: 06-Home-Health Care Svc      Discharge Orders   Future Appointments Provider Department Dept Phone   03/14/2013 4:30 PM Azalia Bilis, MD [facility] [phone]   Future Orders Complete By Expires   Call MD / Call 911  As directed    Comments:     If you experience chest pain or shortness of breath, CALL 911 and be transported to the hospital emergency room.  If you develope a fever above 101 F, pus (white drainage) or increased drainage or redness at the wound, or calf pain, call your surgeon's office.   Constipation Prevention  As directed    Comments:     Drink plenty of fluids.  Prune juice may be helpful.  You may use a stool softener, such as Colace (over the counter) 100 mg twice a day.  Use MiraLax (over the counter) for constipation as needed.   Diet -  low sodium heart healthy  As directed    Discharge instructions  As directed    Comments:     Increase activities as comfort allows. Expect right thigh, leg, and foot swelling. You can get your current dressing wet in the shower. Leave your current dressing in place until next  Friday 2/13; then you may remove it and get your incision with the steri strips wet in the shower.   Discharge patient  As directed    Increase activity slowly as tolerated  As directed       Follow-up Information   Follow up with Mcarthur Rossetti, MD In 2 weeks.   Specialty:  Orthopedic Surgery   Contact information:   [place] [address] [phone]        Signed: Mcarthur Rossetti 02/12/2013, 11:10 AM

## 2013-02-12 NOTE — Op Note (Signed)
NAMEMAGDELENA, KINSELLA NO.:  [id number]  MEDICAL RECORD NO.:  [id number]  LOCATION:  87                         FACILITY:  [facility]  PHYSICIAN:  Lind Guest. Ninfa Linden, M.D.DATE OF BIRTH:  [birth year]  DATE OF PROCEDURE:  02/11/2013 DATE OF DISCHARGE:                              OPERATIVE REPORT   PREOPERATIVE DIAGNOSIS:  Severe end-stage arthritis and degenerative joint disease, right hip.  POSTOPERATIVE DIAGNOSIS:  Severe end-stage arthritis and degenerative joint disease, right hip.  PROCEDURE:  Right total hip arthroplasty through direct anterior approach.  IMPLANTS:  DePuy Sector Gription acetabular component size 50, apex hole eliminator guide, size 32+ 4 neutral polyethylene liner, size 10 Corail femoral component with standard offset, size 32+ 1 ceramic hip ball.  SURGEON:  Jean Rosenthal, MD.  ASSISTING:  Erskine Emery, PA-C.  ANESTHESIA:  General.  ANTIBIOTICS:  2 g IV Ancef.  BLOOD LOSS:  200 mL.  COMPLICATIONS:  None.  INDICATIONS:  Ms. Urbanski is a very pleasant 63 year old female, well known to me.  She is very active and has bilateral hip osteoarthritis. She underwent successful left total hip arthroplasty last year around June or July.  Now, she presents to have the right hip done.  She has had pain with mobility daily, wakes her up at night, she has a trendelenburg gait and radiographic evidence of joint space narrowing, subchondral sclerosis, cystic changes, and periarticular osteophytes. Given the amount of pain she is having, she wished to proceed with a total hip arthroplasty.  She understands the risks of acute blood loss anemia, nerve and vessel injury, fracture, infection, and dislocation. She understands the goals are to decrease pain, improved mobility, and overall improved quality of life.  PROCEDURE DESCRIPTION:  After informed consent was obtained, appropriate right hip was marked.  She was brought to the operating  room.  General anesthesia was obtained while she was on her stretcher.  A Foley catheter was placed and then traction boots were placed on both her feet.  She was next placed supine on the Hana fracture table with perineal post in place and both legs in inline skeletal traction devices, but no traction applied.  Her right operative hip was then assessed radiographically as well as the center of the pelvis.  We could judge leg lengths and get good pictures radiographically.  We then placed a sterile drape around the C-arm as well as prepped the right hip with DuraPrep and sterile drapes.  A time-out was called.  She was identified as correct patient, correct right hip.  I then made an incision inferior and posterior to the anterior-superior iliac spine, dissected down to the tensor fascia lata muscle.  The tensor fascia was then divided longitudinally.  I proceeded with a direct anterior approach to the hip.  A Cobra retractor was placed around the lateral neck and up underneath the rectus femoris around the medial neck.  I cauterized the lateral femoral circumflex vessels.  I then opened up the hip capsule in the L-type format.  I found a large joint effusion.  I placed the Cobra retractors within the hip capsule and then made my femoral neck cut with an oscillating saw  just proximal to the lesser trochanter and completed this on osteotome. I placed a corkscrew guide again in the femoral head.  I removed the femoral head in its entirety and found to be devoid of cartilage.  We then cleaned the acetabulum and remnants of the labrum and debris within the acetabulum.  I placed a bent Hohmann medially and a Cobra retractor laterally.  I then began reaming under direct visualization with a size 42 reamer and then reamed in 2 mm increments up to a size 50, this did not drill to other side. All reamers were placed under direct visualization.  The last reamer also under direct fluoroscopy, so I  could obtain my depth of reaming by inclination and anteversion.  Once I was pleased with this, I placed the real DePuy sector Gripton acetabular component size 50, an apex hole eliminator guide, and a 32+ 4 neutral polyethylene liner.  Attention was then turned to the femur. With the leg externally rotated to 100 degrees extended and adducted, I was able to place a Mueller retractor medially and Hohmann retractor behind the greater trochanter.  I released the lateral joint capsule and then used a box cutting osteotome to enter the femoral canal and a rongeur to lateralize.  Once this was done, I was able to use the starter broach and then began broaching using the Corail broaching system from a size 8 up to a size 10 which mashed to other side.  I trailed a standard neck and a 32+ 1 hip ball.  We brought the leg back over and up in traction, internal rotation, reduced the pelvis and it was stable throughout its arc of rotation even past 95 degrees of external rotation.  There is minimal shuck and her leg lengths were measured equal under direct fluoroscopy.  Her offset also appeared equal.  We then dislocated the hip and removed the trial components.  We placed the real Corail femoral component size 10 with standard offset and real 32+ 1 ceramic hip ball, reduced this back in the acetabulum, again it was stable.  We copiously irrigated the soft tissue with normal saline using pulsatile lavage.  We closed the joint capsule with interrupted #1 Ethibond suture, followed by running #1 Vicryl in the tensor fascia, 0 Vicryl in deep tissue, 2-0 Vicryl in subcutaneous tissue, 4-0 Monocryl subcuticular stitch, Steri-Strips, and Aquacel dressing.  She was then taken off of the Hana table, awakened, extubated, and taken to the recovery room in stable condition.  All final counts were correct.  There were no complications noted. Postoperatively, we are going to have her increase her activities  and with weightbearing as tolerated.  Of note, , Erskine Emery, PA-C assisted in the entire case and his assistance was crucial for patient positioning, retracting, and closure of the wound.     Lind Guest. Ninfa Linden, M.D.     CYB/MEDQ  D:  02/11/2013  T:  02/12/2013  Job:  [id number]

## 2013-02-12 NOTE — Progress Notes (Signed)
02/12/2013 1623 Notified [facility] of dc home today with HH. Jonnie Finner RN CCM Case Mgmt phone [phone]

## 2013-02-12 NOTE — Progress Notes (Signed)
Pt stable, scritps, d/c instructions given with no questions/concerns voiced by pt or husband.  Pt transported via wheelchair to private vehicle by NT and husband.

## 2013-02-12 NOTE — Progress Notes (Signed)
Subjective: 1 Day Post-Op Procedure(s) (LRB): RIGHT TOTAL HIP ARTHROPLASTY ANTERIOR APPROACH (Right) Patient reports pain as mild.  Working well with therapy already.  Objective: Vital signs in last 24 hours: Temp:  [97.1 F (36.2 C)-98.3 F (36.8 C)] 98.3 F (36.8 C) (02/07 1000) Pulse Rate:  [53-80] 79 (02/07 1000) Resp:  [16-18] 16 (02/07 1000) BP: (90-113)/(50-68) 103/65 mmHg (02/07 1000) SpO2:  [99 %-100 %] 99 % (02/07 1000)  Intake/Output from previous day: 02/06 0701 - 02/07 0700 In: 4193.8 [P.O.:480; I.V.:3503.8; IV Piggyback:210] Out: 2975 [Urine:2775; Blood:200] Intake/Output this shift: Total I/O In: 840 [P.O.:840] Out: 250 [Urine:250]   Recent Labs  02/12/13 0429  HGB 11.5*    Recent Labs  02/12/13 0429  WBC 11.4*  RBC 3.71*  HCT 33.5*  PLT 208    Recent Labs  02/12/13 0429  NA 137  K 4.5  CL 102  CO2 25  BUN 9  CREATININE 0.63  GLUCOSE 182*  CALCIUM 8.1*   No results found for this basename: LABPT, INR,  in the last 72 hours  Neurovascular intact Sensation intact distally Intact pulses distally Dorsiflexion/Plantar flexion intact Incision: dressing C/D/I  Assessment/Plan: 1 Day Post-Op Procedure(s) (LRB): RIGHT TOTAL HIP ARTHROPLASTY ANTERIOR APPROACH (Right) Up with therapy Discharge home with home health  Mcarthur Rossetti 02/12/2013, 11:06 AM

## 2013-02-12 NOTE — Progress Notes (Signed)
OT Cancellation Note  Patient Details Name: Tara Fernandez MRN: [id number] DOB: 06/21/1950   Cancelled Treatment:    Reason Eval/Treat Not Completed: OT screened, no needs identified, will sign off.  Pt has all necessary DME at home and is able to access her feet already for LB ADL.  This is the pt's second direct anterior THA.  Malka So 02/12/2013, 10:43 AM [phone]

## 2013-02-12 NOTE — Progress Notes (Signed)
Physical Therapy Treatment Patient Details Name: MONNICA SALTSMAN MRN: [id number] DOB: [birth year] Today's Date: 02/12/2013 Time: 6761-9509 PT Time Calculation (min): 35 min  PT Assessment / Plan / Recommendation  History of Present Illness     PT Comments     Follow Up Recommendations  Home health PT     Does the patient have the potential to tolerate intense rehabilitation     Barriers to Discharge        Equipment Recommendations  None recommended by PT    Recommendations for Other Services OT consult  Frequency 7X/week   Progress towards PT Goals Progress towards PT goals: Progressing toward goals  Plan Current plan remains appropriate    Precautions / Restrictions Precautions Precautions: Fall Restrictions Weight Bearing Restrictions: No Other Position/Activity Restrictions: WBAT   Pertinent Vitals/Pain Pt denies pain    Mobility  Bed Mobility Overal bed mobility: Modified Independent Transfers Overall transfer level: Modified independent Equipment used: Rolling walker (2 wheeled) Transfers: Sit to/from Stand General transfer comment: cues for use of UEs Ambulation/Gait Ambulation/Gait assistance: Min guard;Supervision Ambulation Distance (Feet): 600 Feet Assistive device: Rolling walker (2 wheeled) Gait Pattern/deviations: Step-to pattern;Step-through pattern;Shuffle;Trunk flexed General Gait Details: cues for posture, position from RW, ER on R and initial sequencing Stairs: Yes Stairs assistance: Min assist Stair Management: One rail Left;Step to pattern;Forwards;With cane Number of Stairs: 4 General stair comments: cues for sequence    Exercises Total Joint Exercises Ankle Circles/Pumps: AROM;Both;15 reps;Supine Quad Sets: AROM;Both;10 reps;Supine Gluteal Sets: AROM;Both;10 reps;Supine Heel Slides: AAROM;Right;15 reps;Supine Hip ABduction/ADduction: AAROM;Right;15 reps;Supine   PT Diagnosis:    PT Problem List:   PT Treatment Interventions:     PT  Goals (current goals can now be found in the care plan section) Acute Rehab PT Goals Patient Stated Goal: Resume previous lifestyle with decreased pain PT Goal Formulation: With patient Time For Goal Achievement: 02/18/13 Potential to Achieve Goals: Good  Visit Information  Last PT Received On: 02/12/13 Assistance Needed: +1    Subjective Data  Subjective: I am doing so great Patient Stated Goal: Resume previous lifestyle with decreased pain   Cognition  Cognition Arousal/Alertness: Awake/alert Behavior During Therapy: WFL for tasks assessed/performed Overall Cognitive Status: Within Functional Limits for tasks assessed    Balance     End of Session PT - End of Session Activity Tolerance: Patient tolerated treatment well Patient left: in chair;with call bell/phone within reach;with family/visitor present Nurse Communication: Mobility status   GP     Blakelyn Dinges 02/12/2013, 12:42 PM

## 2013-03-14 ENCOUNTER — Encounter: Payer: Self-pay | Admitting: Gynecology

## 2013-03-14 ENCOUNTER — Ambulatory Visit (INDEPENDENT_AMBULATORY_CARE_PROVIDER_SITE_OTHER): Payer: BC Managed Care – PPO | Admitting: Gynecology

## 2013-03-14 VITALS — BP 123/80 | HR 77 | Resp 12 | Ht 63.0 in | Wt 120.0 lb

## 2013-03-14 DIAGNOSIS — IMO0002 Reserved for concepts with insufficient information to code with codable children: Secondary | ICD-10-CM

## 2013-03-14 DIAGNOSIS — Z4689 Encounter for fitting and adjustment of other specified devices: Secondary | ICD-10-CM

## 2013-03-14 DIAGNOSIS — N8111 Cystocele, midline: Secondary | ICD-10-CM

## 2013-03-14 NOTE — Progress Notes (Signed)
Subjective:     Patient ID: Tara Fernandez, female   DOB: [birth year], 63 y.o.   MRN: [id number]  HPI Comments: Pt here for pessary check.  Overall she is very pleased with this pessary over the gelhorn.  She removes it almost nightly and with coitus.  Pt was using premarin cream but ran out and is now using an OTC which she likes but cannot recall.  Pt reports that sex is better, she denies any post-coital bleeding.  She states that sometimes the pessary will slip but is easily fixable and she is not interested in a change.  She denies issues with voiding or defecating.      Review of Systems Per HPI    Objective:   Physical Exam  Nursing note and vitals reviewed. Constitutional: She is oriented to person, place, and time. She appears well-developed and well-nourished.  Genitourinary:    Neurological: She is alert and oriented to person, place, and time.   Pelvic: External genitalia:  no lesions              Urethra:  normal appearing urethra with no masses, tenderness or lesions              Bartholins and Skenes: normal                 Vagina: normal appearing vagina with normal color and discharge, no lesions              Cervix: see picture                Bimanual Exam:  Uterus:  uterus is normal size, shape, consistency and nontender, mobile                                      Adnexa: normal adnexa in size, nontender and no masses                                           Assessment:     Pessary  Check  Vaginal-cervical ulcerations     Plan:     Vagina healed and slight excoriation noted on cervix, pt reports today is first blood she has noticed since pessary  Area treated with AgNO3 Pt instructed to not replace pessary for 2d No sex for 5d Pt to call with nonhormonal lubricant To call to be seen if any further bleeding noted [id number]

## 2013-04-15 ENCOUNTER — Ambulatory Visit (INDEPENDENT_AMBULATORY_CARE_PROVIDER_SITE_OTHER): Payer: BC Managed Care – PPO | Admitting: Sports Medicine

## 2013-04-15 ENCOUNTER — Encounter: Payer: Self-pay | Admitting: Sports Medicine

## 2013-04-15 VITALS — BP 115/78 | Ht 64.0 in | Wt 117.0 lb

## 2013-04-15 DIAGNOSIS — M2170 Unequal limb length (acquired), unspecified site: Secondary | ICD-10-CM

## 2013-04-15 NOTE — Progress Notes (Signed)
Patient ID: KENZLEE FISHBURN, female   DOB: [birth year], 63 y.o.   MRN: [id number] 63 year old female presents with complaint of leg length discrepancy.  She bilateral hip replacements of the past one year. She has a history of scoliosis. She's been using heel lifts in her left shoe to compensate for short leg. She gets little bit of mild general aches and pains in both her knee right knee as well as both feet when standing and walking for long periods. No hip pain currently. Recovered from her surgery without complications.  Pertinent past medical history: Bilateral total hip replacement, scoliosis  Social history: Nonsmoker, occasional alcohol  Review of systems: As per history of present illness otherwise all systems negative  Examination: BP 115/78  Ht 5\' 4"  (1.626 m)  Wt 117 lb (53.071 kg)  BMI 20.07 kg/m2 Leg length: Comparison measurement functionally shows the left leg to be approximately 2 cm shorter than the right when on the exam table. Measurement from ASIS to medial malleolus reveals lites to be a proximally equal.  Back: Marked scoliosis.  Feet: Bunion deformity left foot, arch collapse with excessive pronation bilaterally, varus deformity to the second through fifth ray of the right foot.  Gait:  Marked Trendelenburg with excessive pronation of her feet.

## 2013-04-15 NOTE — Assessment & Plan Note (Signed)
Patient was placed in green soles insoles today in the office. Scaphoid pads was applied to both. In addition a 3/16 heel lift was applied to the left foot. Her gait and degree of Trendelenburg of her hips is markedly improved after this. She will try this correction as this appears to alleviate her symptoms she'll followup for custom orthotics. If not she'll followup and we'll make further adjustments as needed.

## 2013-05-18 ENCOUNTER — Encounter: Payer: Self-pay | Admitting: Emergency Medicine

## 2013-05-18 ENCOUNTER — Ambulatory Visit (INDEPENDENT_AMBULATORY_CARE_PROVIDER_SITE_OTHER): Payer: BC Managed Care – PPO | Admitting: Emergency Medicine

## 2013-05-18 VITALS — BP 106/68 | Ht 64.0 in | Wt 119.0 lb

## 2013-05-18 DIAGNOSIS — M2170 Unequal limb length (acquired), unspecified site: Secondary | ICD-10-CM

## 2013-05-18 DIAGNOSIS — M2241 Chondromalacia patellae, right knee: Secondary | ICD-10-CM | POA: Insufficient documentation

## 2013-05-18 DIAGNOSIS — M2240 Chondromalacia patellae, unspecified knee: Secondary | ICD-10-CM

## 2013-05-18 NOTE — Assessment & Plan Note (Signed)
A metatarsal pad was placed in the left shoe today. She'll try this in a couple of weeks return for custom orthotics. She's been tolerating the insoles well.

## 2013-05-18 NOTE — Progress Notes (Signed)
Patient ID: Tara Fernandez, female   DOB: 1951-01-02, 63 y.o.   MRN: [id number] Patient presents today in followup for leg length discrepancy modified with temporary orthotics. In addition she complains of right knee pain today. She describes a catching sensation when walking downhill with a little of pain in the anterior portion of her right knee. This is an ongoing problem.  No recent injury. He'll it has not alleviated the symptoms. This does help her to walk with less discomfort however.  She does complain a little bit of discomfort under the ball of her left foot as well. In the past she has had a metatarsal pad/bar placed in shoes.  In terms full past medical, social and family history is unchanged from prior visit  Review of systems as per history of present illness otherwise negative.  Examination: BP 106/68  Ht 5\' 4"  (1.626 m)  Wt 119 lb (53.978 kg)  BMI 20.42 kg/m2 Well-developed well-nourished 63 year old white female awake alert and oriented in no acute distress Leg length:  Approximately 2 cm discrepancy with a shorter right leg. This is a functional discrepancy secondary to scoliosis.

## 2013-05-18 NOTE — Assessment & Plan Note (Signed)
Patient is currently taking glucosamine chondroitin. We'll add a body helix compression sleeve. We discussed anti-inflammatories however at this time she would prefer to avoid taking medication and treat this conservatively.

## 2013-06-08 ENCOUNTER — Ambulatory Visit (INDEPENDENT_AMBULATORY_CARE_PROVIDER_SITE_OTHER): Payer: BC Managed Care – PPO | Admitting: Emergency Medicine

## 2013-06-08 ENCOUNTER — Encounter: Payer: Self-pay | Admitting: Emergency Medicine

## 2013-06-08 VITALS — BP 113/73 | Ht 63.0 in | Wt 115.0 lb

## 2013-06-08 DIAGNOSIS — M2170 Unequal limb length (acquired), unspecified site: Secondary | ICD-10-CM

## 2013-06-08 DIAGNOSIS — M2240 Chondromalacia patellae, unspecified knee: Secondary | ICD-10-CM

## 2013-06-08 DIAGNOSIS — M2241 Chondromalacia patellae, right knee: Secondary | ICD-10-CM

## 2013-06-08 NOTE — Assessment & Plan Note (Signed)
Corrected with orthotics today using 5/16 heel lift.

## 2013-06-08 NOTE — Progress Notes (Signed)
Patient ID: Tara Fernandez, female   DOB: 1950/11/23, 63 y.o.   MRN: [id number] 63 you female presents for custom orthotics.  Has been in greensole insoles to correct leg length and arch collapse with improvement in symptoms.  In addition has been using Body Helix knee compression for relief of anterior knee pain.  Overall doing much better with correction and compression sleeve.    Examination: BP 113/73  Ht 5\' 3"  (1.6 m)  Wt 115 lb (52.164 kg)  BMI 20.38 kg/m2 Well developed well nourished 63 yo female in NAD.  Leg length: Comparison measurement functionally shows the left leg to be approximately 2 cm shorter than the right when on the exam table. Measurement from ASIS to medial malleolus reveals lites to be a proximally equal.   Feet: Bunion deformity left foot, arch collapse with excessive pronation bilaterally, varus deformity to the second through fifth ray of the right foot.   Gait: Marked Trendelenburg with excessive pronation of her feet when barefoot.  Patient was fitted for a : standard, cushioned, semi-rigid orthotic. The orthotic was heated and afterward the patient stood on the orthotic blank positioned on the orthotic stand. The patient was positioned in subtalar neutral position and 10 degrees of ankle dorsiflexion in a weight bearing stance. After completion of molding, a stable base was applied to the orthotic blank. The blank was ground to a stable position for weight bearing. Size: 8 Base: blue eva Posting: none Additional orthotic padding: right 5/16th heel lift and small MT pad  Patient reports feeling much better after correction.  Trendlenberg gait improved.    Greater than 40 minutes of face to face time spent in history taking, counseling for orthotic care and use, production of orthotics and evaluation of orthotics in use after production.

## 2013-06-08 NOTE — Assessment & Plan Note (Signed)
Symptoms resolved using compression sleeve on knee with ambulation and prolonged standing.

## 2013-06-13 ENCOUNTER — Encounter: Payer: Self-pay | Admitting: Gynecology

## 2013-06-13 ENCOUNTER — Ambulatory Visit (INDEPENDENT_AMBULATORY_CARE_PROVIDER_SITE_OTHER): Payer: BC Managed Care – PPO | Admitting: Gynecology

## 2013-06-13 VITALS — BP 110/60 | Resp 12 | Ht 63.0 in | Wt 118.0 lb

## 2013-06-13 DIAGNOSIS — Z4689 Encounter for fitting and adjustment of other specified devices: Secondary | ICD-10-CM

## 2013-06-13 NOTE — Progress Notes (Signed)
63 y.o. Married White female [id number] here for pessary check.  Patient has been using following pessary style and size:  5.  She is sexually active.  She describes the following issues with the pessary:  None.  Pt removes and cleans routinely with soap and water.  Pt is using premarin behind ring.  ROS:  no dysuria, trouble voiding or hematuria  Exam:   BP 110/60  Resp 12  Ht 5\' 3"  (1.6 m)  Wt 118 lb (53.524 kg)  BMI 20.91 kg/m2 General appearance: alert, cooperative and appears stated age Cervical, supraclavicular, and axillary nodes normal.   Pelvic: External genitalia:  no lesions              Urethra: normal appearing urethra with no masses, tenderness or lesions              Bartholins and Skenes: normal                 Vagina: atrophic, mild erytherma posterior vagina, no bleeding, non-tender              Cervix: normal appearance Bimanual Exam:  Uterus:  uterus is normal size, shape, consistency and nontender                               Adnexa:    no masses                               Anus:  defer exam  Pessary was removed by pt before office visit.  Pessary was replaced. Patient tolerated procedure well.    A: Cystocele- symptomatic      Atrophic vaginitis  P:  Return to office in 3 months for recheck.           An After Visit Summary was printed and given to the patient.

## 2013-07-04 ENCOUNTER — Ambulatory Visit (INDEPENDENT_AMBULATORY_CARE_PROVIDER_SITE_OTHER): Payer: BC Managed Care – PPO | Admitting: Nurse Practitioner

## 2013-07-04 ENCOUNTER — Encounter: Payer: Self-pay | Admitting: Nurse Practitioner

## 2013-07-04 VITALS — BP 118/70 | HR 60 | Temp 99.2°F | Ht 63.5 in | Wt 118.0 lb

## 2013-07-04 DIAGNOSIS — N390 Urinary tract infection, site not specified: Secondary | ICD-10-CM

## 2013-07-04 LAB — POCT URINALYSIS DIPSTICK
Bilirubin, UA: NEGATIVE
Glucose, UA: NEGATIVE
KETONES UA: NEGATIVE
NITRITE UA: NEGATIVE
PH UA: 6
UROBILINOGEN UA: NEGATIVE

## 2013-07-04 MED ORDER — NITROFURANTOIN MONOHYD MACRO 100 MG PO CAPS: 100.0000 mg | ORAL_CAPSULE | Freq: Two times a day (BID) | ORAL | Status: DC

## 2013-07-04 NOTE — Patient Instructions (Addendum)
Urinary Tract Infection Urinary tract infections (UTIs) can develop anywhere along your urinary tract. Your urinary tract is your body's drainage system for removing wastes and extra water. Your urinary tract includes two kidneys, two ureters, a bladder, and a urethra. Your kidneys are a pair of bean-shaped organs. Each kidney is about the size of your fist. They are located below your ribs, one on each side of your spine. CAUSES Infections are caused by microbes, which are microscopic organisms, including fungi, viruses, and bacteria. These organisms are so small that they can only be seen through a microscope. Bacteria are the microbes that most commonly cause UTIs. SYMPTOMS  Symptoms of UTIs may vary by age and gender of the patient and by the location of the infection. Symptoms in young women typically include a frequent and intense urge to urinate and a painful, burning feeling in the bladder or urethra during urination. Older women and men are more likely to be tired, shaky, and weak and have muscle aches and abdominal pain. A fever may mean the infection is in your kidneys. Other symptoms of a kidney infection include pain in your back or sides below the ribs, nausea, and vomiting. DIAGNOSIS To diagnose a UTI, your caregiver will ask you about your symptoms. Your caregiver also will ask to provide a urine sample. The urine sample will be tested for bacteria and white blood cells. White blood cells are made by your body to help fight infection. TREATMENT  Typically, UTIs can be treated with medication. Because most UTIs are caused by a bacterial infection, they usually can be treated with the use of antibiotics. The choice of antibiotic and length of treatment depend on your symptoms and the type of bacteria causing your infection. HOME CARE INSTRUCTIONS  If you were prescribed antibiotics, take them exactly as your caregiver instructs you. Finish the medication even if you feel better after you  have only taken some of the medication.  Drink enough water and fluids to keep your urine clear or pale yellow.  Avoid caffeine, tea, and carbonated beverages. They tend to irritate your bladder.  Empty your bladder often. Avoid holding urine for long periods of time.  Empty your bladder before and after sexual intercourse.  After a bowel movement, women should cleanse from front to back. Use each tissue only once. SEEK MEDICAL CARE IF:   You have back pain.  You develop a fever.  Your symptoms do not begin to resolve within 3 days. SEEK IMMEDIATE MEDICAL CARE IF:   You have severe back pain or lower abdominal pain.  You develop chills.  You have nausea or vomiting.  You have continued burning or discomfort with urination. MAKE SURE YOU:   Understand these instructions.  Will watch your condition.  Will get help right away if you are not doing well or get worse. Document Released: 10/02/2004 Document Revised: 06/24/2011 Document Reviewed: 01/31/2011 [facility] Patient Information 2015 [place], [place]. This information is not intended to replace advice given to you by your health care provider. Make sure you discuss any questions you have with your health care provider.    Trimo san OTC

## 2013-07-04 NOTE — Progress Notes (Signed)
Subjective:     Patient ID: Tara Fernandez, female   DOB: [birth year], 63 y.o.   MRN: [id number]  HPI   This 63 yo WM Fe  patient comes in with symptoms of UTI since Friday.  They have recently been on a trip to celebrate their anniversary and was in a hot tub.  She feels this aggravated or caused this infection. Symptoms include urgency, frequency, and dysuria.  Some left lower back pain and low grade fever and chills today.  She has used a ring Pessary for 25 years and no problems with insertion or removal. She normally takes it out for SA and occasionally at other times. For cleaning.  Last time changed was last pm.  She is using vaginal estrogen cream but not very often because of her concerns over breast cancer.  She had a first degree relative with breast cancer.  Recent visit to see Tara Fernandez for vaginal erosions secondary to pessary use.  The last visit on 6/8 and almost all healed..   Review of Systems  Constitutional: Positive for fever, chills and fatigue.  Respiratory: Negative.   Cardiovascular: Negative.   Gastrointestinal: Negative for nausea, vomiting, abdominal pain, diarrhea, constipation and abdominal distention.  Genitourinary: Positive for dysuria, urgency and frequency. Negative for vaginal discharge, vaginal pain and dyspareunia.  Musculoskeletal: Negative.   Skin: Negative.   Neurological: Negative.   Psychiatric/Behavioral: Negative.        Objective:   Physical Exam  Constitutional: She is oriented to person, place, and time. She appears well-developed and well-nourished.  Abdominal: Soft. She exhibits no distension. There is no tenderness. There is no rebound and no guarding.  No flank pain.  Genitourinary:  Declines a repeat vaginal exam. Urine large RBC, trace protein, large WBC  Neurological: She is alert and oriented to person, place, and time.  Psychiatric: She has a normal mood and affect. Her behavior is normal. Judgment and thought content normal.  anxious  that she had to wait in the lobby because someone was late getting checked in.       Assessment:     History of recent vaginal erosions secondary to pessary use UTI     Plan:     Will start on Macrobid 100 mg BID # 14 Will follow with urine culture and micro If symptoms are not improved in 24- 48 hrs - would like to do CBC. Will call her for a progress report 6/30

## 2013-07-05 ENCOUNTER — Other Ambulatory Visit (INDEPENDENT_AMBULATORY_CARE_PROVIDER_SITE_OTHER): Payer: BC Managed Care – PPO

## 2013-07-05 ENCOUNTER — Other Ambulatory Visit: Payer: Self-pay | Admitting: Nurse Practitioner

## 2013-07-05 ENCOUNTER — Telehealth: Payer: Self-pay

## 2013-07-05 DIAGNOSIS — R319 Hematuria, unspecified: Principal | ICD-10-CM

## 2013-07-05 DIAGNOSIS — N390 Urinary tract infection, site not specified: Secondary | ICD-10-CM

## 2013-07-05 LAB — CBC WITH DIFFERENTIAL/PLATELET
BASOS PCT: 0 % (ref 0–1)
Basophils Absolute: 0 10*3/uL (ref 0.0–0.1)
EOS ABS: 0 10*3/uL (ref 0.0–0.7)
Eosinophils Relative: 0 % (ref 0–5)
HCT: 38.4 % (ref 36.0–46.0)
HEMOGLOBIN: 13.9 g/dL (ref 12.0–15.0)
LYMPHS ABS: 1.1 10*3/uL (ref 0.7–4.0)
Lymphocytes Relative: 9 % — ABNORMAL LOW (ref 12–46)
MCH: 30.4 pg (ref 26.0–34.0)
MCHC: 36.2 g/dL — ABNORMAL HIGH (ref 30.0–36.0)
MCV: 84 fL (ref 78.0–100.0)
MONOS PCT: 12 % (ref 3–12)
Monocytes Absolute: 1.4 10*3/uL — ABNORMAL HIGH (ref 0.1–1.0)
NEUTROS PCT: 79 % — AB (ref 43–77)
Neutro Abs: 9.4 10*3/uL — ABNORMAL HIGH (ref 1.7–7.7)
Platelets: 197 10*3/uL (ref 150–400)
RBC: 4.57 MIL/uL (ref 3.87–5.11)
RDW: 13.9 % (ref 11.5–15.5)
WBC: 11.9 10*3/uL — ABNORMAL HIGH (ref 4.0–10.5)

## 2013-07-05 LAB — URINALYSIS, MICROSCOPIC ONLY
Casts: NONE SEEN
Crystals: NONE SEEN
WBC, UA: >50 WBC/hpf — AB (ref <3)

## 2013-07-05 NOTE — Telephone Encounter (Signed)
Message copied by Jasmine Awe on Tue Jul 05, 2013 11:50 AM ------      Message from: Kem Boroughs R      Created: Mon Jul 04, 2013  6:38 PM       Please call her for a progress report in the afternoon, if not feeling better lets do CBC.  She had low grade temp here ------

## 2013-07-05 NOTE — Telephone Encounter (Signed)
Left message to call Kaitlyn at [phone]. 

## 2013-07-05 NOTE — Telephone Encounter (Signed)
Patient came in to office and was seen for lab draw today.  Routing to provider for final review. Patient agreeable to disposition. Will close encounter

## 2013-07-05 NOTE — Telephone Encounter (Signed)
Spoke with patient. Patient states "I am still having chills but that is it." Patient has not checked temperature today. 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 I am in control of my pee now and I went home yesterday and slept for hours." Patient requesting to wait until tomorrow to see how she feels and give [place] a call back to schedule a lab appointment if she does not feel better.

## 2013-07-05 NOTE — Progress Notes (Signed)
Encounter reviewed by Dr. Brook Silva.  

## 2013-07-07 ENCOUNTER — Other Ambulatory Visit: Payer: Self-pay | Admitting: Nurse Practitioner

## 2013-07-07 ENCOUNTER — Encounter: Payer: Self-pay | Admitting: Nurse Practitioner

## 2013-07-07 ENCOUNTER — Ambulatory Visit (INDEPENDENT_AMBULATORY_CARE_PROVIDER_SITE_OTHER): Payer: BC Managed Care – PPO | Admitting: Nurse Practitioner

## 2013-07-07 ENCOUNTER — Telehealth: Payer: Self-pay | Admitting: Nurse Practitioner

## 2013-07-07 VITALS — BP 100/62 | HR 76 | Temp 99.4°F | Ht 63.5 in | Wt 115.0 lb

## 2013-07-07 DIAGNOSIS — N390 Urinary tract infection, site not specified: Secondary | ICD-10-CM

## 2013-07-07 DIAGNOSIS — R319 Hematuria, unspecified: Principal | ICD-10-CM

## 2013-07-07 MED ORDER — CEFTRIAXONE SODIUM 500 MG IJ SOLR
500.0000 mg | Freq: Once | INTRAMUSCULAR | Status: AC
  Administered 2013-07-07: 500 mg via INTRAMUSCULAR

## 2013-07-07 NOTE — Patient Instructions (Signed)

## 2013-07-07 NOTE — Telephone Encounter (Signed)
After discussion with lab Microbiology this am the predominant urine micro-organism is gram negative and final results are still pending.  Her WBC is elevated to 11.9.  Patient was called for an updated progress report and she had fever and chills last pm and had to change linen twice.  She also states the antibiotics makes her feel 'unwell' during the day and she has to be active cleaning houses. She is taking the first dose of med's at 2 pm and the second before bedtime.  Still trying to increase po fluids.she is advised to return to the office for Rocephin and needs to repeat CBC on Monday.  She is agreeable and will come right now.

## 2013-07-07 NOTE — Progress Notes (Signed)
Subjective:     Patient ID: Tara Fernandez, female   DOB: [birth year], 63 y.o.   MRN: [id number]  HPI  This 63 yo WM Fe comes back for a recheck on UTI.  She was found to have an elevated WBC and will be given Rocephin.  She continues on Macrobid but is taking med's after working about 2 pm and again in the evening before bedtime.  See previous phone note.  She had fever and chills again last pm.  Still has some low back pain but not as bad.   Review of Systems  Constitutional: Negative for fever, chills and fatigue.  Gastrointestinal: Positive for nausea. Negative for vomiting, abdominal pain, diarrhea, constipation, blood in stool and abdominal distention.  Genitourinary: Positive for dysuria, urgency, flank pain and decreased urine volume. Negative for frequency.  Musculoskeletal: Negative.   Skin: Negative.   Neurological: Negative.   Psychiatric/Behavioral: Negative.        Objective:   Physical Exam  Constitutional: She is oriented to person, place, and time. She appears well-developed and well-nourished.  Abdominal: Soft. She exhibits no distension. There is no tenderness. There is no rebound and no guarding.  No true flank pain today  Genitourinary:  Patient declined pelvic today.  Neurological: She is alert and oriented to person, place, and time.  Psychiatric: She has a normal mood and affect. Her behavior is normal. Judgment and thought content normal.       Assessment:     UTI - with elevated WBC persistent fevers/ chills    Plan:     Per consult with Dr. Quincy Simmonds - will give her Rocephin 500 mg IM today and follow with OV on Monday and repeat CBC. She is to continue with Macrobid in the interim until urine culture is returned.

## 2013-07-07 NOTE — Progress Notes (Signed)
Patient tolerated Rocephin Injection well was released at 2:25 pm patient states she "feels fine". No reaction noted.

## 2013-07-09 LAB — URINE CULTURE: Colony Count: 85000

## 2013-07-11 ENCOUNTER — Encounter: Payer: Self-pay | Admitting: Nurse Practitioner

## 2013-07-11 ENCOUNTER — Other Ambulatory Visit: Payer: Self-pay | Admitting: Obstetrics & Gynecology

## 2013-07-11 ENCOUNTER — Inpatient Hospital Stay (HOSPITAL_COMMUNITY)
Admission: AD | Admit: 2013-07-11 | Discharge: 2013-07-11 | Disposition: A | Discharge status: Home / Self Care | Payer: BC Managed Care – PPO | Source: Ambulatory Visit | Attending: Obstetrics & Gynecology | Admitting: Obstetrics & Gynecology

## 2013-07-11 ENCOUNTER — Telehealth: Payer: Self-pay | Admitting: Emergency Medicine

## 2013-07-11 ENCOUNTER — Ambulatory Visit (INDEPENDENT_AMBULATORY_CARE_PROVIDER_SITE_OTHER): Payer: BC Managed Care – PPO | Admitting: Nurse Practitioner

## 2013-07-11 VITALS — BP 100/62 | HR 84 | Temp 98.2°F | Ht 63.0 in | Wt 117.0 lb

## 2013-07-11 DIAGNOSIS — N390 Urinary tract infection, site not specified: Secondary | ICD-10-CM

## 2013-07-11 DIAGNOSIS — R319 Hematuria, unspecified: Principal | ICD-10-CM

## 2013-07-11 LAB — CBC WITH DIFFERENTIAL/PLATELET
BASOS ABS: 0.1 10*3/uL (ref 0.0–0.1)
Basophils Relative: 1 % (ref 0–1)
Eosinophils Absolute: 0.1 10*3/uL (ref 0.0–0.7)
Eosinophils Relative: 2 % (ref 0–5)
HEMATOCRIT: 35.5 % — AB (ref 36.0–46.0)
Hemoglobin: 12.6 g/dL (ref 12.0–15.0)
LYMPHS ABS: 1.3 10*3/uL (ref 0.7–4.0)
Lymphocytes Relative: 21 % (ref 12–46)
MCH: 30.4 pg (ref 26.0–34.0)
MCHC: 35.5 g/dL (ref 30.0–36.0)
MCV: 85.5 fL (ref 78.0–100.0)
Monocytes Absolute: 0.7 10*3/uL (ref 0.1–1.0)
Monocytes Relative: 11 % (ref 3–12)
NEUTROS ABS: 3.9 10*3/uL (ref 1.7–7.7)
Neutrophils Relative %: 65 % (ref 43–77)
PLATELETS: 347 10*3/uL (ref 150–400)
RBC: 4.15 MIL/uL (ref 3.87–5.11)
RDW: 13.8 % (ref 11.5–15.5)
WBC: 6 10*3/uL (ref 4.0–10.5)

## 2013-07-11 LAB — POCT URINALYSIS DIPSTICK
BILIRUBIN UA: NEGATIVE
GLUCOSE UA: NEGATIVE
Ketones, UA: NEGATIVE
Nitrite, UA: NEGATIVE
Protein, UA: NEGATIVE
UROBILINOGEN UA: NEGATIVE
pH, UA: 6

## 2013-07-11 MED ORDER — CEFTRIAXONE SODIUM 1 G IJ SOLR
1.0000 g | Freq: Once | INTRAMUSCULAR | Status: AC
  Administered 2013-07-11: 1 g via INTRAMUSCULAR
  Filled 2013-07-11: qty 10

## 2013-07-11 MED ORDER — CIPROFLOXACIN HCL 500 MG PO TABS: 500.0000 mg | ORAL_TABLET | Freq: Two times a day (BID) | ORAL | Status: DC

## 2013-07-11 MED ORDER — CEFTRIAXONE SODIUM 1 G IJ SOLR: 1.0000 g | Freq: Once | INTRAMUSCULAR | Status: DC

## 2013-07-11 NOTE — Telephone Encounter (Signed)
Spoke with pt.  Advised of pseudomonas UTI and high resistance to antibiotics.  Need for careful monitoring advised.  CBC from today still pending.  Pt denied fever.  Still with mild back pain.  Since starting Ciprofloxin (which bacteria is sensitive to), she is having increased dysuria.  She "feels like it is getting worse".  Recommended [facility] visit this evening for Rocephin injection and continuing Ciprofloxin.  Advised may need to continue with Rocephin injections until symptoms improve.  Also offered admission for IV antibiotics which she declines.  Knows to continue Ciprofloxin.  Called [facility] and spoke with charge nurse, Butch Penny.  Order placed for Rocephin 1 gram IM x 1.   Advised pt we will check on her in AM and advise of CBC results.  Pt very appreciative.

## 2013-07-11 NOTE — Progress Notes (Signed)
Patient ID: Tara Fernandez, female   DOB: 1950/02/01, 63 y.o.   MRN: [id number] S: HPI This 63 yo WM Fe comes back for a recheck on UTI with suspected Pyelo. She was found to have an elevated WBC and was given Rocephin on 07/07/13. She continues on Macrobid but now that final culture results are back this is not the antibiotic of choice.  She denies further fever and chills. Now no low back pain and appetite has improved.  Urine culture after re-incubation shows 85,000 pseudomonas species that is sensitive to Cipro.  She will also get a repeat CBC today. She feels better overall.  O: Abdomen is soft and non tender.  No flank pain.  Overall is improved with color and mobility. Urine chemstrip still shows RBC - small and WBC 2+.   A: UTI - suspected pyelonephritis - follow up  Pseudomonas UTI - resistant to Macrobid  Flank pain that has resolved after injection of Rocephin 500 mg on 07/07/13  Use of Pessary for cystocele and uterine prolpase  Plan:  Continue to monitor fever and chills  Will change antibiotics to Cipro 500 mg BID for a week then will check a TOC  Will repeat CBC today and follow  She will report if any changes in the interim.

## 2013-07-11 NOTE — Patient Instructions (Signed)

## 2013-07-11 NOTE — Telephone Encounter (Addendum)
Spoke with patient at time of incoming call. She was seen here in office this morning. She has questions about her medication.  She states "I was feeling better, but now I am having pain when I go to the bathroom, I didn't feel like this when I was on the other antibiotics."  Patient denies fevers, chills, flank pain, abdominal pain.  She states she took one Cipro 500 mg at noon and another at time of phone call at 4:00.  She states she feels she is on the incorrect treatment. Discussed the importance of the urine culture and that without the results of the urine culture, sometimes the antibiotics need to be changed based on the results of the urine culture.  She states "If I just need to give it time, just tell me, but I want to know for sure." Advised patient I would review with Milford Cage, FNP and call her back. Patient is agreeable to this.  Patient requests return call on her cell phone.   Reviewed patients complaints and concerns with Milford Cage, FNP.  No new orders received. Patient to take Cipro 500 mg BID, push fluids and rest.   1654: Returned patient's call. Message from Milford Cage, [place] discussed. Advised to continue with Cipro mg every 12 hours only. Patient states she understands that she is on correct antibiotic, but since is having dysuria after just starting Cipro then she doesn't understanding why she should stay on cipro. Patient's voice raised and said, "this is not helping me now!"  I advised if symptoms are worsening then she should go to urgent care or emergency room. Patient states "I am getting off the of phone. I am not happy. You should know I am not happy with this. Goodbye." And disconnected line.     1700: Reviewed with Nursing Supervisor, Lamont Snowball, RN. Will discuss with Dr. Sabra Heck.

## 2013-07-12 ENCOUNTER — Telehealth: Payer: Self-pay | Admitting: Emergency Medicine

## 2013-07-12 NOTE — Progress Notes (Signed)
Encounter reviewed by Dr. Camera Krienke Silva.  

## 2013-07-12 NOTE — Telephone Encounter (Signed)
Please let pt know that her WBC ct is normal.  She does need to complete the 7 days of Cipro and needs a TOC urine sample after that.  I think she should see a provider that day as well.  For now, I do not think she needs another rocephin injection.  If any of her symptoms worsen, please have her call back.  Thank you.

## 2013-07-12 NOTE — Progress Notes (Signed)
Encounter reviewed by Dr. Brook Silva.  

## 2013-07-12 NOTE — Telephone Encounter (Signed)
Patient returned call. Message from Tara Fernandez, [place] given. Patient verbalized understanding and states "I feel good." Denies fevers or chills, states that dysuria is improved, but she has to remind herself that she does not need to urinate as often. She is at work today. She is very thankful for follow up. She is continuing with Cipro as directed. She states that Dr. Sabra Heck mentioned she may need another rocephin injection in the afternoon today. I advised that I do not see orders but that I would let Dr. Sabra Heck know she is feeling improved and would clarify need for additional injection and would return her call. Patient requests detailed message on cell phone with message.  Dr. Sabra Heck, does patient need to come in again today for another rocephin injection?

## 2013-07-12 NOTE — Telephone Encounter (Signed)
Outgoing call to patient to give CBC results from Milford Cage, [place]. Sent via [employer].  Message left to return call to [place] at [phone].   Lab Results  Component Value Date   WBC 6.0 07/11/2013   HGB 12.6 07/11/2013   HCT 35.5* 07/11/2013   MCV 85.5 07/11/2013   PLT 347 07/11/2013   Message from Milford Cage, FNP: Jenny Reichmann, your repeat CBC is great- showing that the antibiotic shot really helped. You will do well on the Cipro to finish this infections.

## 2013-07-12 NOTE — Telephone Encounter (Signed)
Spoke with patient and message from Dr. Sabra Heck given. Patient is agreeable to this plan. She is scheduled for follow up with Milford Cage, FNP for 07/26/13 at 1615. She will call back with any change in symptoms.  Routing to provider for final review. Patient agreeable to disposition. Will close encounter

## 2013-07-25 ENCOUNTER — Ambulatory Visit: Payer: BC Managed Care – PPO

## 2013-07-26 ENCOUNTER — Ambulatory Visit (INDEPENDENT_AMBULATORY_CARE_PROVIDER_SITE_OTHER): Payer: BC Managed Care – PPO | Admitting: Nurse Practitioner

## 2013-07-26 ENCOUNTER — Encounter: Payer: Self-pay | Admitting: Nurse Practitioner

## 2013-07-26 VITALS — BP 100/64 | HR 88 | Temp 99.1°F | Ht 63.0 in | Wt 117.0 lb

## 2013-07-26 DIAGNOSIS — R319 Hematuria, unspecified: Principal | ICD-10-CM

## 2013-07-26 DIAGNOSIS — N390 Urinary tract infection, site not specified: Secondary | ICD-10-CM

## 2013-07-26 NOTE — Progress Notes (Signed)
Subjective:     Patient ID: Tara Fernandez, female   DOB: 1950-10-09, 63 y.o.   MRN: [id number]  HPI This 63 yo MW Fe here for pseudomonas UTI TOC.   No problems with pessary.   Appetite is back to normal.  No back pain.   Back to usual activities.  No fever.  No unusual problems with urgency or frequency. States the antibiotic injections really helped her.  Husband notes that she is back to being normal.  Review of Systems  Constitutional: Negative for fever, chills and fatigue.  Respiratory: Negative.   Cardiovascular: Negative.   Gastrointestinal: Negative for nausea, vomiting, abdominal pain, constipation, abdominal distention and anal bleeding.  Genitourinary: Negative for dysuria, frequency, hematuria, flank pain, decreased urine volume, vaginal discharge and pelvic pain.  Musculoskeletal: Negative.   Skin: Negative.   Neurological: Negative.   Psychiatric/Behavioral: Negative.        Objective:   Physical Exam  Constitutional: She is oriented to person, place, and time. She appears well-developed and well-nourished. No distress.  Abdominal: Soft. She exhibits no distension. There is no tenderness. There is no rebound.  Genitourinary:  Not enough urine to do chemstrip and send out for lab.  Will await final results.  Neurological: She is alert and oriented to person, place, and time.  Psychiatric: She has a normal mood and affect. Her behavior is normal. Judgment and thought content normal.       Assessment:     TOC for pseudomonas UTI   History of pessary use for cystocele Plan:     Discussed other treatments for prolapse and cystocele for which she uses the pessary.  She will consider coming in for evaluation or at least a consult with Dr. Quincy Simmonds - card is given. Will await the urine culture and follow.

## 2013-07-26 NOTE — Patient Instructions (Signed)
Dr. Quincy Simmonds - uro GYN

## 2013-07-27 LAB — URINALYSIS, MICROSCOPIC ONLY
BACTERIA UA: NONE SEEN
Casts: NONE SEEN
Crystals: NONE SEEN

## 2013-07-28 LAB — URINE CULTURE

## 2013-07-31 ENCOUNTER — Encounter: Payer: Self-pay | Admitting: Nurse Practitioner

## 2013-08-01 ENCOUNTER — Ambulatory Visit (INDEPENDENT_AMBULATORY_CARE_PROVIDER_SITE_OTHER): Payer: BC Managed Care – PPO | Admitting: Nurse Practitioner

## 2013-08-01 ENCOUNTER — Telehealth: Payer: Self-pay | Admitting: Nurse Practitioner

## 2013-08-01 ENCOUNTER — Ambulatory Visit: Payer: BC Managed Care – PPO | Admitting: Nurse Practitioner

## 2013-08-01 ENCOUNTER — Encounter: Payer: Self-pay | Admitting: Nurse Practitioner

## 2013-08-01 VITALS — BP 110/62 | HR 84 | Temp 98.1°F | Resp 20 | Ht 63.0 in | Wt 118.0 lb

## 2013-08-01 DIAGNOSIS — N390 Urinary tract infection, site not specified: Secondary | ICD-10-CM

## 2013-08-01 DIAGNOSIS — R319 Hematuria, unspecified: Principal | ICD-10-CM

## 2013-08-01 LAB — POCT URINALYSIS DIPSTICK
BILIRUBIN UA: NEGATIVE
GLUCOSE UA: NEGATIVE
Ketones, UA: NEGATIVE
Nitrite, UA: NEGATIVE
Protein, UA: NEGATIVE
UROBILINOGEN UA: NEGATIVE
pH, UA: 5

## 2013-08-01 NOTE — Telephone Encounter (Signed)
Patient was informed about ordering a pessary for her would need a refitting.  I also discussed her care with Dr. Quincy Simmonds and she agreed that pessary needs to be adjusted first. She is agreeable after the culture results to return for a fitting.

## 2013-08-01 NOTE — Progress Notes (Signed)
Subjective:     Patient ID: Tara Fernandez, female   DOB: [birth year], 63 y.o.   MRN: [id number]  HPI   This 63 yo WM Fe comes is today for a follow up on her UTI symptoms.  She came in last week and had a TOC as directed.  But the culture had multiple bacteria and was advised to come back in.  She currently has no symptoms of UTI with dysuria, urgency and frequency.  She does use a size 5  Ring pessary and she is able to remove it on her own and cleans it well.  She now understands that we would like to do an in and out cath specimen and she is very fearful of having anything done.  Has never had a  Catheter without being put to sleep.  Review of Systems  Constitutional: Negative for fever, chills and fatigue.  Respiratory: Negative.   Cardiovascular: Negative.   Gastrointestinal: Negative.  Negative for nausea, vomiting, abdominal pain, diarrhea, constipation, blood in stool and abdominal distention.  Genitourinary: Negative for dysuria, urgency, frequency, hematuria, flank pain, vaginal discharge, vaginal pain, pelvic pain and dyspareunia.  Musculoskeletal: Negative.   Neurological: Negative.   Psychiatric/Behavioral: Negative.        Objective:   Physical Exam  Constitutional: She is oriented to person, place, and time. She appears well-developed and well-nourished. No distress.  Abdominal: Soft. She exhibits no distension. There is no tenderness. There is no rebound and no guarding.  No flank pain  Genitourinary:  Voided 36 cc. Urine chemstrip was trace RBC and leuk's.  Then about 20 minutes later; she had a sterile In and out straight cath urine without problems.  Urine C&S was sent to the lab and total collected was 165 cc.  That urine was barely a trace of RBC's and no leuk's.  The pessary was then removed.  The area on vaginal excoriation was on the right side and with probing only had a small amount of bleeding.  The pessary was cleaned and replaced.  It is noted that pessary was too  easily removed and replaced - which makes me wonder if the pessary is not strong enough to hold.  Her previous pessaries have been the cube and Gellhorn which was too difficult to remove.  Neurological: She is alert and oriented to person, place, and time.  Skin: Skin is warm and dry.  Psychiatric: She has a normal mood and affect. Her behavior is normal. Judgment normal.       Assessment:     Urinary retention - maybe secondary to recent UTI or pessary size not appropriate Sterile TOC for Pseudomonas UTI    Plan:     No treatment started yet -waiting on test results Checked on the various sizes of pessary and the size 5 is a 3" ring with support. It would be best if we fitted her for the correct size before ordering. Patient was given a call and she is OK to return for fitting when we need her to come in.

## 2013-08-02 LAB — URINALYSIS, MICROSCOPIC ONLY
Bacteria, UA: NONE SEEN
Casts: NONE SEEN
Crystals: NONE SEEN
SQUAMOUS EPITHELIAL / LPF: NONE SEEN

## 2013-08-03 LAB — URINE CULTURE
COLONY COUNT: NO GROWTH
Organism ID, Bacteria: NO GROWTH

## 2013-08-04 ENCOUNTER — Encounter: Payer: Self-pay | Admitting: Nurse Practitioner

## 2013-08-04 ENCOUNTER — Ambulatory Visit (INDEPENDENT_AMBULATORY_CARE_PROVIDER_SITE_OTHER): Payer: BC Managed Care – PPO | Admitting: Nurse Practitioner

## 2013-08-04 VITALS — BP 110/62 | Resp 12 | Ht 63.0 in | Wt 118.0 lb

## 2013-08-04 DIAGNOSIS — N814 Uterovaginal prolapse, unspecified: Secondary | ICD-10-CM

## 2013-08-04 NOTE — Progress Notes (Signed)
Encounter reviewed by Dr. Brook Silva.  

## 2013-08-04 NOTE — Patient Instructions (Signed)
Can return with husband for a consult visit with Dr. Quincy Simmonds to discuss other surgical options for prolapse repair when she is ready.

## 2013-08-04 NOTE — Progress Notes (Signed)
Encounter reviewed by Dr. Zora Glendenning Silva.  

## 2013-08-04 NOTE — Progress Notes (Signed)
63 y.o. Married White female   [id number] here for pessary fitting.  Patient has been diagnosed with the following indications warranting pessary use: Cystocele- symptomatic and pelvic relaxation.  She has used a cube and Gelhorn pessary in the past.    She reports these associated symptoms: Vaginal discharge: no Vaginal pressure:  yes Urinary symptoms:   None at present - she did have UTI and that has now cleared. Other:  Urine retention noted on last exam and maybe pessary is not the correct size for her.   She has been counseled about other options including pelvic physical therapy, surgical intervention, as well as doing nothing.  She has decided to proceed with pessary use and is here for fitting today.    Patient is sexually active. Urine culture has been performed.  Results:  negative.  Exam: BP 110/62  Resp 12  Ht 5\' 3"  (1.6 m)  Wt 118 lb (53.524 kg)  BMI 20.91 kg/m2 General appearance: alert, cooperative and appears stated age none   Pelvic: External genitalia:  no lesions and well estrogenic              Urethra: normal appearing urethra with no masses, tenderness or lesions              Bartholin's and Skene's: normal                 Vagina: normal appearing vagina with normal color and discharge, no lesions              Cervix: normal appearance Bimanual Exam:  Uterus:  uterus is normal size, shape, consistency and non tender                               Adnexa:    not indicated and no masses                               Anus:  defer exam  Procedure:  Patient fitted with the following pessary and sizes:  Ring pessary with support size 6 & 7.  After  Fitting,with size 7 realized it was too large.  Then size 6 was inserted and fit was better.   Patient was advised to redress, ambulate, and voided without problems  The best fit was with size 6.  Pessary was removed without difficulty.  Her ring pessary size 5  Was reinserted  Assessment:   SUI, Urge incontinence,  Cystocele- symptomatic Pelvic relaxation  Plan: Pessary will be ordered for patient and she will return to office in 1 weeks or as order comes in for pessary pick-up.  She will not need appointment for replacement as she is able to remove this size on her own.  Information about removing and cleaning pessary was given. She does plan on returning with husband at some point and seek advise about surgical intervention.    Aftercare summary was provided.

## 2013-08-09 ENCOUNTER — Telehealth: Payer: Self-pay

## 2013-08-09 NOTE — Telephone Encounter (Signed)
LMOM to contact office that her pessary is here and ready for pick up

## 2013-08-10 DIAGNOSIS — N814 Uterovaginal prolapse, unspecified: Secondary | ICD-10-CM

## 2013-08-10 NOTE — Telephone Encounter (Signed)
Patient returning Shannon's call.

## 2013-08-10 NOTE — Telephone Encounter (Signed)
LMOM to contact office about pessary is ready for pick up.

## 2013-08-10 NOTE — Telephone Encounter (Signed)
Pt informed

## 2013-08-10 NOTE — Telephone Encounter (Signed)
Patient is returning Tara Fernandez's call.  

## 2013-09-02 ENCOUNTER — Other Ambulatory Visit: Payer: Self-pay | Admitting: Nurse Practitioner

## 2013-09-02 MED ORDER — ESTROGENS, CONJUGATED 0.625 MG/GM VA CREA: 1.0000 | TOPICAL_CREAM | VAGINAL | Status: DC

## 2013-09-02 NOTE — Telephone Encounter (Signed)
conjugated estrogens (PREMARIN) vaginal cream  Place 1 Applicatorful vaginally 2 (two) times a week. Monday and Friday , Informant: Self, Last Dose: Not Recorded  [id number] - [place], [place] - [address]  Patient calling for refill on above medication.

## 2013-09-02 NOTE — Telephone Encounter (Signed)
Okay to refill per Dr. Charlies Constable until patient's AEX  Premarin #42.5 gm/1 refill sent to [facility].  Patient aware, scheduled patient for AEX 10/18/13 @ 12:45 with Ms. Patty. (Patient requested)

## 2013-09-02 NOTE — Telephone Encounter (Signed)
Last refilled: 10/15/12 #42.5 gm/3 refills  -Was discontinued by another office Last AEX: 10/15/12 with Dr. Charlies Constable AEX Scheduled: no current AEX scheduled Last Mammogram: 12/15/12 Bi-Rads 2  Please Advise.

## 2013-09-30 ENCOUNTER — Encounter: Payer: Self-pay | Admitting: Nurse Practitioner

## 2013-09-30 ENCOUNTER — Ambulatory Visit (INDEPENDENT_AMBULATORY_CARE_PROVIDER_SITE_OTHER): Payer: BC Managed Care – PPO | Admitting: Nurse Practitioner

## 2013-09-30 VITALS — BP 110/70 | HR 72 | Temp 98.4°F | Ht 63.0 in | Wt 121.0 lb

## 2013-09-30 DIAGNOSIS — R350 Frequency of micturition: Secondary | ICD-10-CM

## 2013-09-30 LAB — POCT URINALYSIS DIPSTICK
Bilirubin, UA: NEGATIVE
GLUCOSE UA: NEGATIVE
KETONES UA: NEGATIVE
Nitrite, UA: NEGATIVE
UROBILINOGEN UA: NEGATIVE
pH, UA: 5.5

## 2013-09-30 LAB — URINALYSIS, MICROSCOPIC ONLY
Bacteria, UA: NONE SEEN
Casts: NONE SEEN
Crystals: NONE SEEN

## 2013-09-30 MED ORDER — CIPROFLOXACIN HCL 500 MG PO TABS: 500.0000 mg | ORAL_TABLET | Freq: Two times a day (BID) | ORAL | Status: DC

## 2013-09-30 MED ORDER — ESTROGENS, CONJUGATED 0.625 MG/GM VA CREA: 1.0000 | TOPICAL_CREAM | VAGINAL | Status: DC

## 2013-09-30 NOTE — Patient Instructions (Signed)

## 2013-09-30 NOTE — Progress Notes (Signed)
S:  63 y.o.Married Caucasian female presents with complaint of UTI. Symptoms began on Sunday. Started with urinary frequency that she thought was related to drinking champaign.  She has tried UT Vibrace 4 times a day.  Now changing to D- Mannose . No dysuria and low back pain.  Some better now.  With symptoms of urinary frequency.  Pertinent negatives include:  The patient is having no constitutional symptoms, denying fever, chills, anorexia, or weight loss.. Sexually active yes  Symptoms related to post coital: No.   Current method of birth control postmenopausal.  Vaginal dryness: yes.   Same partner without change. Last UTI documented 2 months ago.  ROS: no weight loss, fever, night sweats and feels well  O Alert, oriented to person, place, and time, normal mood, behavior, speech, dress, motor activity, and thought processes.  Not ill appearing as last UTI a few months ago.   thin, healthy,  alert and  not in acute distress  Abdomen: normal  No CVA tenderness  deferred   Diagnostic Test:    Urinalysis chemstrip = wbc- 2+, rbc- large, trace protein   urine culture and micro  Assessment  : Medications: ciprofloxacin. Maintain adequate hydration. Follow up if symptoms not improving, and as needed.   Medication Therapy: Cipro 500 mg twice a day #14   Lab:TOC if Urine Culture is positive   Plan: she has AEX apt. On Oct 18, 2013

## 2013-10-01 LAB — URINE CULTURE

## 2013-10-03 ENCOUNTER — Telehealth: Payer: Self-pay | Admitting: Nurse Practitioner

## 2013-10-03 NOTE — Telephone Encounter (Addendum)
Spoke with patient. Advised of results as seen below from Milford Cage, [place]. Patient is concerned about waiting two weeks for recheck. "If this the right medication for me to be on?" Advised patient to stay on current medication per Milford Cage, FNP for what showed on urine culture. Patient states that she is concerned and would like to come back in sooner if she needs to. Advised patient would speak to Milford Cage, FNP for her and give her a call back with further recommendations and instructions. Patient agreeable.  Notes Recorded by Milford Cage, FNP on 10/02/2013 at 9:22 PM Let patient know that she had some bacteria but multiple growth as though this could be contamination. Have her finish the med's and at her AEX we will do urine cath for C& S like we did last time.

## 2013-10-03 NOTE — Telephone Encounter (Signed)
Spoke with patient. Advised spoke with Milford Cage, FNP who states that patient can stop the antibiotic at this time and come in on Monday 10/5 for urine cath c & s. If symptoms occur post stopping antibiotic will need to complete and return five days after completion for another culture. Or may wait until aex. Patient would like to stop taking the antibiotic at this time and come in on Monday 10/5. Appointment scheduled for 10/5 at 12:45pm with Milford Cage, [place]. Patient agreeable to date and time.  Routing to provider for final review. Patient agreeable to disposition. Will close encounter

## 2013-10-03 NOTE — Telephone Encounter (Signed)
Patient states she is returning Stephanie's call. No phone note entered. Please advise patient.

## 2013-10-04 ENCOUNTER — Encounter: Payer: Self-pay | Admitting: Nurse Practitioner

## 2013-10-04 NOTE — Progress Notes (Signed)
Encounter reviewed by Dr. Julieth Tugman Silva.  

## 2013-10-07 ENCOUNTER — Encounter: Payer: Self-pay | Admitting: Obstetrics and Gynecology

## 2013-10-07 ENCOUNTER — Ambulatory Visit (INDEPENDENT_AMBULATORY_CARE_PROVIDER_SITE_OTHER): Payer: BC Managed Care – PPO | Admitting: Obstetrics and Gynecology

## 2013-10-07 ENCOUNTER — Telehealth: Payer: Self-pay | Admitting: Nurse Practitioner

## 2013-10-07 VITALS — BP 122/70 | HR 76 | Temp 98.7°F | Ht 63.0 in | Wt 121.2 lb

## 2013-10-07 DIAGNOSIS — R319 Hematuria, unspecified: Secondary | ICD-10-CM

## 2013-10-07 DIAGNOSIS — N765 Ulceration of vagina: Secondary | ICD-10-CM

## 2013-10-07 DIAGNOSIS — N898 Other specified noninflammatory disorders of vagina: Secondary | ICD-10-CM

## 2013-10-07 DIAGNOSIS — R350 Frequency of micturition: Secondary | ICD-10-CM

## 2013-10-07 LAB — POCT URINALYSIS DIPSTICK
Bilirubin, UA: NEGATIVE
Glucose, UA: NEGATIVE
Ketones, UA: NEGATIVE
Nitrite, UA: NEGATIVE
Protein, UA: NEGATIVE
UROBILINOGEN UA: NEGATIVE
pH, UA: 5

## 2013-10-07 MED ORDER — NITROFURANTOIN MONOHYD MACRO 100 MG PO CAPS: 100.0000 mg | ORAL_CAPSULE | Freq: Two times a day (BID) | ORAL | Status: DC

## 2013-10-07 NOTE — Progress Notes (Signed)
Patient ID: Tara Fernandez, female   DOB: [birth year], 63 y.o.   MRN: [id number] GYNECOLOGY VISIT  PCP:   Referring provider:   HPI: 63 y.o.   Married  Caucasian  female   G2P2002 with No LMP recorded. Patient is postmenopausal.   here for  Urinary frequency and left flank pain.  Up to void 12 times last night.  Denies urinary pain.  No cloudiness of urine or odor.  One week ago developed urinary frequency and low back pain.  Took an herbal remedy and this did not help. Saw patty Grubb and was started on Ciprofloxacin.  Urine culture which was negative for infection on 09/30/13. Culture showed mixed bacteria 30,000 colonies.  Told to stop abx. Noted joint pains and bottom of feet hurting while on Cipro.  Drinking teas, orange juice, and cranberry juice, and several bottles of water daily.  Drinks 1 - 2 cups of coffee per day.   Normal bowel movements.  Uses a pessary for 25 years. Used a Gelhorn pessary and was cleaning it with Listerine.  Switched to a new pessary and has now had "UTIs" and wants to know what is happening.  (On chart review had Pseudomonas UTI in June which was treated.  All other cultures negative for isolated organism.) Using estrogen cream.  Urine:  1+ WBC's, 1+RBC's Temp 98.7  GYNECOLOGIC HISTORY: No LMP recorded. Patient is postmenopausal. Sexually active:  yes Partner preference: female Contraception:  postmenopausal  Menopausal hormone therapy:  DES exposure:  no  Blood transfusions:   no Sexually transmitted diseases:  no  GYN procedures and prior surgeries:  Left breast mass excision--benign Last mammogram: 12-15-12 [facility]                Last pap and high risk HPV testing: 2013 normal   History of abnormal pap smear:  no   OB History   Grav Para Term Preterm Abortions TAB SAB Ect Mult Living   2 2 2       2        LIFESTYLE: Exercise:               Tobacco: no Alcohol:   Occ. wine Drug use:  no  Patient Active Problem List   Diagnosis  Date Noted  . Chondromalacia of right patellofemoral joint 05/18/2013  . Leg length discrepancy 04/15/2013  . Arthritis of right hip 02/11/2013  . Status post THR (total hip replacement) 02/11/2013  . Cystocele 11/19/2012  . Uterine prolapse 11/19/2012  . Degenerative arthritis of hip 06/17/2012    Past Medical History  Diagnosis Date  . Arthritis     osteoarthritis; PAIN RIGHT HIP  . Vaginal pessary present     PT HAS SMALL BLOOD BLISTER  - UP INSIDE VAGINA - PT'S GYN DOCTOR- DR LATHROP- AWARE -TOLD TO USE PREMARIN VAGINAL CREAM.  PT DOES NOT HAVE VAGINAL BLEEDING    Past Surgical History  Procedure Laterality Date  . Breast mass excision      Hx: of left breast  . Total hip arthroplasty Left 06/17/2012    Procedure: LEFT TOTAL HIP ARTHROPLASTY ANTERIOR APPROACH;  Surgeon: Mcarthur Rossetti, MD;  Location: [facility];  Service: Orthopedics;  Laterality: Left;  . Cartilage surgery  10 th grade    RIGHT KNEE  . Total hip arthroplasty Right 02/11/2013    Procedure: RIGHT TOTAL HIP ARTHROPLASTY ANTERIOR APPROACH;  Surgeon: Mcarthur Rossetti, MD;  Location: [facility];  Service: Orthopedics;  Laterality: Right;  Current Outpatient Prescriptions  Medication Sig Dispense Refill  . Cholecalciferol (VITAMIN D PO) Take 10,000 Units by mouth daily.      . ciprofloxacin (CIPRO) 500 MG tablet Take 1 tablet (500 mg total) by mouth 2 (two) times daily.  14 tablet  0  . conjugated estrogens (PREMARIN) vaginal cream Place 1 Applicatorful vaginally 2 (two) times a week. Monday and Friday  42.5 g  2  . D-MANNOSE PO Take 1 tablet by mouth daily. For urinary health      . EVENING PRIMROSE OIL PO Take 1,300 mg by mouth daily.      Marland Kitchen MAGNESIUM PO Take 1 tablet by mouth daily.      . NON FORMULARY daily. Marshmallow   HERB      . Nutritional Supplements (DHEA PO) Take 1 tablet by mouth daily.      Marland Kitchen OVER THE COUNTER MEDICATION Take 1 tablet by mouth daily. Herb:  Positive energy      . Potassium 99  MG TABS Take 99 mg by mouth daily.      . Probiotic Product (PROBIOTIC & ACIDOPHILUS EX ST) CAPS Take 1 capsule by mouth daily.      . Glucosamine HCl (GLUCOSAMINE PO) Take 1 tablet by mouth daily.      Marland Kitchen OVER THE COUNTER MEDICATION Apply 1 application topically every other day. Over the counter progesterone cream       No current facility-administered medications for this visit.     ALLERGIES: Tramadol; Chlorhexidine; Ciprofloxacin; Other; and Latex  Family History  Problem Relation Age of Onset  . Heart attack Father   . Cancer - Lung Sister   . Breast cancer Sister 83  . Cancer - Lung Mother     History   Social History  . Marital Status: Married    Spouse Name: N/A    Number of Children: N/A  . Years of Education: N/A   Occupational History  . Not on file.   Social History Main Topics  . Smoking status: Never Smoker   . Smokeless tobacco: Never Used  . Alcohol Use: Yes     Comment: occasional WINE  . Drug Use: No  . Sexual Activity: Yes    Partners: Male    Birth Control/ Protection: Post-menopausal   Other Topics Concern  . Not on file   Social History Narrative  . No narrative on file    ROS:  Pertinent items are noted in HPI.  PHYSICAL EXAMINATION:    BP 122/70  Pulse 76  Temp(Src) 98.7 F (37.1 C) (Oral)  Ht 5\' 3"  (1.6 m)  Wt 121 lb 3.2 oz (54.976 kg)  BMI 21.48 kg/m2   Wt Readings from Last 3 Encounters:  10/07/13 121 lb 3.2 oz (54.976 kg)  09/30/13 121 lb (54.885 kg)  08/04/13 118 lb (53.524 kg)     Ht Readings from Last 3 Encounters:  10/07/13 5\' 3"  (1.6 m)  09/30/13 5\' 3"  (1.6 m)  08/04/13 5\' 3"  (1.6 m)    General appearance: alert, cooperative and appears stated age Head: Normocephalic, without obvious abnormality, atraumatic Lungs: clear to auscultation bilaterally Heart: regular rate and rhythm Abdomen: soft, non-tender; no masses,  no organomegaly Back:  No CVA tenderness. No abnormal inguinal nodes palpated Neurologic:  Grossly normal  Pelvic: External genitalia:  no lesions              Urethra:  normal appearing urethra with no masses, tenderness or lesions  Bartholins and Skenes: normal                 Vagina:  Posterior vaginal ulceration and granulation tissue.               Cervix: ulceration of the cervix.                  Bimanual Exam:  Uterus:  uterus is normal size, shape, consistency and nontender                                      Adnexa: normal adnexa in size, nontender and no masses                                      Rectovaginal: Confirms                                      Anus:  normal sphincter tone, no lesions.  No rectovaginal fistula noted.   Sterile catheterization performed after cleansing urethra with betadine.  Urine sent to lab. Pessary removed, cleansed, and given to the patient in a biobag.  ASSESSMENT  Pseudomonas urinary infection this summer.  All follow up cultures negative. Urinary frequency.  Incomplete uterovaginal prolapse.  Pessary use causing ulceration.   PLAN  Macrobid 100 mg po bid for 7 days.  Sterile cath urine micro and culture. Leave pessary out.  Use Premarin cream 1/2 gram pv at hs nightly for 2 weeks.  Patient wants to cancel the order that was just placed for a new pessary.  Communication sent. Discussed bladder irritants.  Recheck in 2 weeks.   An After Visit Summary was printed and given to the patient.  40 minutes face to face time of which over 50% was spent in counseling.

## 2013-10-07 NOTE — Telephone Encounter (Signed)
Spoke with patient. She feels that symptoms have worsened since stopping Cipro and does not want to restart cipro because it caused joint pain and shoulder pain.  Patient reports L Side back pain that is now radiating to R side. Advised best to come in for evaluation with MD and not wait until Monday. Appointment scheduled today with Dr. Quincy Simmonds at 1300. Patient is agreeable to this.   Routing to provider for final review. Patient agreeable to disposition. Will close encounter

## 2013-10-07 NOTE — Telephone Encounter (Signed)
Pt has an appt on Monday with Tara Fernandez but is stating that she isnt getting any relief from the UTI. Pt peed 12 times during the night. She is in a lot of discomfort and pain on left side. Pt said that she was told to stop taking the antibiotic for 5 days and to come back Monday for another culture but she is worried she is hurting her self more by not taking anything.

## 2013-10-08 LAB — URINALYSIS, MICROSCOPIC ONLY
BACTERIA UA: NONE SEEN
Casts: NONE SEEN
Crystals: NONE SEEN
SQUAMOUS EPITHELIAL / LPF: NONE SEEN

## 2013-10-09 LAB — URINE CULTURE
Colony Count: NO GROWTH
Organism ID, Bacteria: NO GROWTH

## 2013-10-10 ENCOUNTER — Telehealth: Payer: Self-pay | Admitting: *Deleted

## 2013-10-10 ENCOUNTER — Ambulatory Visit: Payer: BC Managed Care – PPO | Admitting: Nurse Practitioner

## 2013-10-10 ENCOUNTER — Ambulatory Visit: Payer: BC Managed Care – PPO | Admitting: Gynecology

## 2013-10-10 NOTE — Telephone Encounter (Signed)
Since order had not been shipped, it was able to be deleted from order. Patient will not be charged. Call to patient. Notified of above.  Routing to provider for final review. Patient agreeable to disposition. Will close encounter

## 2013-10-10 NOTE — Telephone Encounter (Signed)
Message copied by Jaymes Graff on Mon Oct 10, 2013 11:43 AM ------      Message from: [place], BROOK E      Created: Fri Oct 07, 2013  1:51 PM      Regarding: please cancel pessary that was just ordered.        [name],            I just saw this patient and she is having vaginal ulceration from her current pessary.  She tells me that she just had another one ordered for her and she wants to cancel this order. Are we able to do that?  I did not promise her that it was possible.             I am seeing her back in 2 weeks to do a recheck of the area and then reassess the next step in her treatment.             Thanks.  ------

## 2013-10-11 ENCOUNTER — Telehealth: Payer: Self-pay | Admitting: Nurse Practitioner

## 2013-10-11 NOTE — Telephone Encounter (Signed)
Patient has an aex 10/18/13 with Edman Circle and a 2 week reck with Dr.Silva 10/21/13. Patient is asking if she needs to delay her aex appointment due to her ongoing issues?

## 2013-10-11 NOTE — Telephone Encounter (Signed)
Spoke with patient. Advised it is truly up to her if she would like to wait for annual exam until after next visit with Dr. Quincy Simmonds. Patient would like to do so. Current annual exam with Milford Cage, FNP cancelled for now and patient will r/s with Milford Cage, FNP for annual exam after next visit with Dr. Quincy Simmonds.  Routing to provider for final review. Patient agreeable to disposition. Will close encounter

## 2013-10-18 ENCOUNTER — Ambulatory Visit: Payer: BC Managed Care – PPO | Admitting: Nurse Practitioner

## 2013-10-21 ENCOUNTER — Ambulatory Visit (INDEPENDENT_AMBULATORY_CARE_PROVIDER_SITE_OTHER): Payer: BC Managed Care – PPO | Admitting: Obstetrics and Gynecology

## 2013-10-21 ENCOUNTER — Encounter: Payer: Self-pay | Admitting: Obstetrics and Gynecology

## 2013-10-21 VITALS — BP 110/66 | HR 80 | Temp 98.2°F | Resp 18 | Ht 63.0 in | Wt 120.0 lb

## 2013-10-21 DIAGNOSIS — N814 Uterovaginal prolapse, unspecified: Secondary | ICD-10-CM

## 2013-10-21 DIAGNOSIS — N898 Other specified noninflammatory disorders of vagina: Secondary | ICD-10-CM

## 2013-10-21 LAB — POCT URINALYSIS DIPSTICK
Bilirubin, UA: NEGATIVE
Glucose, UA: NEGATIVE
Ketones, UA: NEGATIVE
Leukocytes, UA: NEGATIVE
Nitrite, UA: NEGATIVE
PROTEIN UA: NEGATIVE
UROBILINOGEN UA: NEGATIVE
pH, UA: 5

## 2013-10-21 NOTE — Progress Notes (Signed)
GYNECOLOGY  VISIT   HPI: 63 y.o.   Married  Caucasian  female   G2P2002 with No LMP recorded. Patient is postmenopausal.   here for   Follow up. Has granulation tissue from pessary use and has been keeping it out.   Pseudomonas urinary infection this summer following hot tub. All follow up cultures negative.   Macrobid 100 mg po bid for 7 days after visit on 10/07/13. Sterile cath urine culture negative.    Use Premarin cream 1/2 gram pv at hs nightly for 2 weeks.  Worried about it due to her family history of breast cancer.   Used Gelhorn pessary in the past well for many years.  Never had a problem until started cleaning it with Listerine.  Would need a new one if she returned to this pessary type.   Using DHAE and Evening Primrose.   Strong family history of breast cancer.  Urine today - trace blood.  No culture requested.   GYNECOLOGIC HISTORY: No LMP recorded. Patient is postmenopausal. Contraception:   Post-Menopausal  Menopausal hormone therapy: Premarin        OB History   Grav Para Term Preterm Abortions TAB SAB Ect Mult Living   2 2 2       2          Patient Active Problem List   Diagnosis Date Noted  . Chondromalacia of right patellofemoral joint 05/18/2013  . Leg length discrepancy 04/15/2013  . Arthritis of right hip 02/11/2013  . Status post THR (total hip replacement) 02/11/2013  . Cystocele 11/19/2012  . Uterine prolapse 11/19/2012  . Degenerative arthritis of hip 06/17/2012    Past Medical History  Diagnosis Date  . Arthritis     osteoarthritis; PAIN RIGHT HIP  . Vaginal pessary present     PT HAS SMALL BLOOD BLISTER  - UP INSIDE VAGINA - PT'S GYN DOCTOR- DR LATHROP- AWARE -TOLD TO USE PREMARIN VAGINAL CREAM.  PT DOES NOT HAVE VAGINAL BLEEDING    Past Surgical History  Procedure Laterality Date  . Breast mass excision      Hx: of left breast  . Total hip arthroplasty Left 06/17/2012    Procedure: LEFT TOTAL HIP ARTHROPLASTY ANTERIOR  APPROACH;  Surgeon: Mcarthur Rossetti, MD;  Location: [facility];  Service: Orthopedics;  Laterality: Left;  . Cartilage surgery  10 th grade    RIGHT KNEE  . Total hip arthroplasty Right 02/11/2013    Procedure: RIGHT TOTAL HIP ARTHROPLASTY ANTERIOR APPROACH;  Surgeon: Mcarthur Rossetti, MD;  Location: [facility];  Service: Orthopedics;  Laterality: Right;    Current Outpatient Prescriptions  Medication Sig Dispense Refill  . Cholecalciferol (VITAMIN D PO) Take 10,000 Units by mouth daily.      . ciprofloxacin (CIPRO) 500 MG tablet Take 1 tablet (500 mg total) by mouth 2 (two) times daily.  14 tablet  0  . conjugated estrogens (PREMARIN) vaginal cream Place 1 Applicatorful vaginally 2 (two) times a week. Monday and Friday  42.5 g  2  . D-MANNOSE PO Take 1 tablet by mouth daily. For urinary health      . EVENING PRIMROSE OIL PO Take 1,300 mg by mouth daily.      . Glucosamine HCl (GLUCOSAMINE PO) Take 1 tablet by mouth daily.      Marland Kitchen MAGNESIUM PO Take 1 tablet by mouth daily.      . nitrofurantoin, macrocrystal-monohydrate, (MACROBID) 100 MG capsule Take 1 capsule (100 mg total) by mouth 2 (  two) times daily.  14 capsule  0  . NON FORMULARY daily. Marshmallow   HERB      . Nutritional Supplements (DHEA PO) Take 1 tablet by mouth daily.      Marland Kitchen OVER THE COUNTER MEDICATION Take 1 tablet by mouth daily. Herb:  Positive energy      . OVER THE COUNTER MEDICATION Apply 1 application topically every other day. Over the counter progesterone cream      . Potassium 99 MG TABS Take 99 mg by mouth daily.      . Probiotic Product (PROBIOTIC & ACIDOPHILUS EX ST) CAPS Take 1 capsule by mouth daily.       No current facility-administered medications for this visit.     ALLERGIES: Tramadol; Chlorhexidine; Ciprofloxacin; Other; and Latex  Family History  Problem Relation Age of Onset  . Heart attack Father   . Cancer - Lung Sister   . Breast cancer Sister 7  . Cancer - Lung Mother     History    Social History  . Marital Status: Married    Spouse Name: N/A    Number of Children: N/A  . Years of Education: N/A   Occupational History  . Not on file.   Social History Main Topics  . Smoking status: Never Smoker   . Smokeless tobacco: Never Used  . Alcohol Use: Yes     Comment: occasional WINE  . Drug Use: No  . Sexual Activity: Yes    Partners: Male    Birth Control/ Protection: Post-menopausal   Other Topics Concern  . Not on file   Social History Narrative  . No narrative on file    ROS:  Pertinent items are noted in HPI.  PHYSICAL EXAMINATION:    There were no vitals taken for this visit.     General appearance: alert, cooperative and appears stated age   Pelvic: External genitalia:  no lesions              Urethra:  normal appearing urethra with no masses, tenderness or lesions              Bartholins and Skenes: normal                 Vagina: posterior vaginal wall with 4 - 1.5 cm area of beefy red mucosa. Less thick than last visit. Bleeds slightly with Q-tip palpation.               Cervix: normal appearance.  Cervix prolapses almost to the hymen.   ASSESSMENT  Uterovaginal prolapse.  Vaginal granulation tissue from pessary use.  One UTI this summer. Other cultures negative.  Family history of breast cancer.   PLAN  Reduce estrogen cream to 1/2 gm three times a week.  Discussed the low dosage of the estrogen cream and that this is short term use.  Will mail information to patient about prolapse and surgery for this. Follow up in 3 weeks.   An After Visit Summary was printed and given to the patient.  _25_____ minutes face to face time of which over 50% was spent in counseling.

## 2013-10-24 ENCOUNTER — Ambulatory Visit: Payer: BC Managed Care – PPO | Admitting: Nurse Practitioner

## 2013-11-07 ENCOUNTER — Encounter: Payer: Self-pay | Admitting: Obstetrics and Gynecology

## 2013-11-11 ENCOUNTER — Encounter: Payer: Self-pay | Admitting: Obstetrics and Gynecology

## 2013-11-11 ENCOUNTER — Ambulatory Visit (INDEPENDENT_AMBULATORY_CARE_PROVIDER_SITE_OTHER): Payer: BC Managed Care – PPO | Admitting: Obstetrics and Gynecology

## 2013-11-11 VITALS — BP 122/70 | HR 70 | Ht 63.0 in | Wt 120.8 lb

## 2013-11-11 DIAGNOSIS — N898 Other specified noninflammatory disorders of vagina: Secondary | ICD-10-CM

## 2013-11-11 DIAGNOSIS — N813 Complete uterovaginal prolapse: Secondary | ICD-10-CM

## 2013-11-11 NOTE — Progress Notes (Signed)
Patient ID: Tara Fernandez, female   DOB: 01-12-1950, 63 y.o.   MRN: [id number] GYNECOLOGY  VISIT   HPI: 63 y.o.   Married  Caucasian  female   G2P2002 with Patient's last menstrual period was 01/07/1996 (approximate).   here for follow up.   Using estrogen cream 1/2 gm three times a week.   Wants to use pessary again.   GYNECOLOGIC HISTORY: Patient's last menstrual period was 01/07/1996 (approximate). Contraception: postmenopausal   Menopausal hormone therapy: Premarin vaginal cream and Progesterone cream        OB History    Gravida Para Term Preterm AB TAB SAB Ectopic Multiple Living   2 2 2       2          Patient Active Problem List   Diagnosis Date Noted  . Chondromalacia of right patellofemoral joint 05/18/2013  . Leg length discrepancy 04/15/2013  . Arthritis of right hip 02/11/2013  . Status post THR (total hip replacement) 02/11/2013  . Cystocele 11/19/2012  . Uterine prolapse 11/19/2012  . Degenerative arthritis of hip 06/17/2012    Past Medical History  Diagnosis Date  . Arthritis     osteoarthritis; PAIN RIGHT HIP  . Vaginal pessary present     PT HAS SMALL BLOOD BLISTER  - UP INSIDE VAGINA - PT'S GYN DOCTOR- DR LATHROP- AWARE -TOLD TO USE PREMARIN VAGINAL CREAM.  PT DOES NOT HAVE VAGINAL BLEEDING    Past Surgical History  Procedure Laterality Date  . Breast mass excision      Hx: of left breast  . Total hip arthroplasty Left 06/17/2012    Procedure: LEFT TOTAL HIP ARTHROPLASTY ANTERIOR APPROACH;  Surgeon: Mcarthur Rossetti, MD;  Location: [place];  Service: Orthopedics;  Laterality: Left;  . Cartilage surgery  10 th grade    RIGHT KNEE  . Total hip arthroplasty Right 02/11/2013    Procedure: RIGHT TOTAL HIP ARTHROPLASTY ANTERIOR APPROACH;  Surgeon: Mcarthur Rossetti, MD;  Location: [facility];  Service: Orthopedics;  Laterality: Right;    Current Outpatient Prescriptions  Medication Sig Dispense Refill  . Cholecalciferol (VITAMIN D PO) Take 10,000 Units  by mouth daily.    Marland Kitchen conjugated estrogens (PREMARIN) vaginal cream Place 1 Applicatorful vaginally 2 (two) times a week. Monday and Friday 42.5 g 2  . D-MANNOSE PO Take 1 tablet by mouth daily. For urinary health    . EVENING PRIMROSE OIL PO Take 1,300 mg by mouth daily.    . Glucosamine HCl (GLUCOSAMINE PO) Take 1 tablet by mouth daily.    Marland Kitchen MAGNESIUM PO Take 1 tablet by mouth daily.    . NON FORMULARY daily. Marshmallow   HERB    . Nutritional Supplements (DHEA PO) Take 1 tablet by mouth daily.    Marland Kitchen OVER THE COUNTER MEDICATION Take 1 tablet by mouth daily. Herb:  Positive energy    . OVER THE COUNTER MEDICATION Apply 1 application topically every other day. Over the counter progesterone cream    . Potassium 99 MG TABS Take 99 mg by mouth daily.    . Probiotic Product (PROBIOTIC & ACIDOPHILUS EX ST) CAPS Take 1 capsule by mouth daily.     No current facility-administered medications for this visit.     ALLERGIES: Tramadol; Chlorhexidine; Ciprofloxacin; Other; and Latex  Family History  Problem Relation Age of Onset  . Heart attack Father   . Cancer - Lung Sister   . Breast cancer Sister 44  . Cancer - Lung Mother  History   Social History  . Marital Status: Married    Spouse Name: N/A    Number of Children: N/A  . Years of Education: N/A   Occupational History  . Not on file.   Social History Main Topics  . Smoking status: Never Smoker   . Smokeless tobacco: Never Used  . Alcohol Use: 0.0 oz/week    0 Not specified per week     Comment: occasional WINE  . Drug Use: No  . Sexual Activity:    Partners: Male    Birth Control/ Protection: Post-menopausal   Other Topics Concern  . Not on file   Social History Narrative    ROS:  Pertinent items are noted in HPI.  PHYSICAL EXAMINATION:    BP 122/70 mmHg  Pulse 70  Ht 5\' 3"  (1.6 m)  Wt 120 lb 12.8 oz (54.795 kg)  BMI 21.40 kg/m2  LMP 01/07/1996 (Approximate)     General appearance: alert, cooperative and  appears stated age    Pelvic: External genitalia:  no lesions              Urethra:  normal appearing urethra with no masses, tenderness or lesions              Bartholins and Skenes: normal                 Vagina: normal appearing vagina with normal color and discharge, large 4 x 2 area of granulation tissue of the posterior vaginal vault - treated with AgNO3 after verbal consent.               Cervix: normal appearance.  Complete uterocervical prolapse.  Third degree cystocele.                   Bimanual Exam:  Uterus:  uterus is normal size, shape, consistency and nontender                                      Adnexa: normal adnexa in size, nontender and no masses                                        ASSESSMENT  Uterovaginal prolapse. Vaginal granulation tissue from pessary use.  Persistent.  Using vaginal estrogen cream.   PLAN  Will plan for weekly visits for reassessment of healing of the posterior vaginal cuff.  Patient understands that she cannot return to use of the pessary at this time and that continued use can leak to rectovaginal fistula.  Return in one week.   An After Visit Summary was printed and given to the patient.  ___15___ minutes face to face time of which over 50% was spent in counseling.

## 2013-11-14 ENCOUNTER — Ambulatory Visit: Payer: BC Managed Care – PPO | Admitting: Obstetrics and Gynecology

## 2013-11-18 ENCOUNTER — Encounter: Payer: Self-pay | Admitting: Obstetrics and Gynecology

## 2013-11-18 ENCOUNTER — Ambulatory Visit (INDEPENDENT_AMBULATORY_CARE_PROVIDER_SITE_OTHER): Payer: BC Managed Care – PPO | Admitting: Obstetrics and Gynecology

## 2013-11-18 VITALS — BP 120/84 | HR 76 | Resp 16 | Ht 63.0 in | Wt 120.0 lb

## 2013-11-18 DIAGNOSIS — N898 Other specified noninflammatory disorders of vagina: Secondary | ICD-10-CM

## 2013-11-18 NOTE — Patient Instructions (Signed)
Please bring in your pessary and vaginal estrogen cream so I can use some of it when we hopefully place the pessary back in.

## 2013-11-18 NOTE — Progress Notes (Signed)
GYNECOLOGY  VISIT   HPI: 63 y.o.   Married  Caucasian  female   G2P2002 with Patient's last menstrual period was 01/07/1996 (approximate).   here for  Follow up.  Has granulation tissue from pessary use that has not resolved with leaving pessary out and doing vaginal estrogen therapy. Patient worried about usage of estrogen cream due to family history of breast cancer in her sister who is now deceased.  Patient feeling frustrated about not being able to wear pessary and have intimacy with her husband.   GYNECOLOGIC HISTORY: Patient's last menstrual period was 01/07/1996 (approximate). Contraception: Post-menopausal   Menopausal hormone therapy: N/A Stopped premarin 1 week ago        OB History    Gravida Para Term Preterm AB TAB SAB Ectopic Multiple Living   2 2 2       2          Patient Active Problem List   Diagnosis Date Noted  . Chondromalacia of right patellofemoral joint 05/18/2013  . Leg length discrepancy 04/15/2013  . Arthritis of right hip 02/11/2013  . Status post THR (total hip replacement) 02/11/2013  . Cystocele 11/19/2012  . Uterine prolapse 11/19/2012  . Degenerative arthritis of hip 06/17/2012    Past Medical History  Diagnosis Date  . Arthritis     osteoarthritis; PAIN RIGHT HIP  . Vaginal pessary present     PT HAS SMALL BLOOD BLISTER  - UP INSIDE VAGINA - PT'S GYN DOCTOR- DR LATHROP- AWARE -TOLD TO USE PREMARIN VAGINAL CREAM.  PT DOES NOT HAVE VAGINAL BLEEDING    Past Surgical History  Procedure Laterality Date  . Breast mass excision      Hx: of left breast  . Total hip arthroplasty Left 06/17/2012    Procedure: LEFT TOTAL HIP ARTHROPLASTY ANTERIOR APPROACH;  Surgeon: Mcarthur Rossetti, MD;  Location: [place];  Service: Orthopedics;  Laterality: Left;  . Cartilage surgery  10 th grade    RIGHT KNEE  . Total hip arthroplasty Right 02/11/2013    Procedure: RIGHT TOTAL HIP ARTHROPLASTY ANTERIOR APPROACH;  Surgeon: Mcarthur Rossetti, MD;   Location: [facility];  Service: Orthopedics;  Laterality: Right;    Current Outpatient Prescriptions  Medication Sig Dispense Refill  . Cholecalciferol (VITAMIN D PO) Take 10,000 Units by mouth daily.    . D-MANNOSE PO Take 1 tablet by mouth daily. For urinary health    . EVENING PRIMROSE OIL PO Take 1,300 mg by mouth daily.    . Glucosamine HCl (GLUCOSAMINE PO) Take 1 tablet by mouth daily.    Marland Kitchen MAGNESIUM PO Take 1 tablet by mouth daily.    . NON FORMULARY daily. Marshmallow   HERB    . Nutritional Supplements (DHEA PO) Take 1 tablet by mouth daily.    Marland Kitchen OVER THE COUNTER MEDICATION Take 1 tablet by mouth daily. Herb:  Positive energy    . OVER THE COUNTER MEDICATION Apply 1 application topically every other day. Over the counter progesterone cream    . Potassium 99 MG TABS Take 99 mg by mouth daily.    . Probiotic Product (PROBIOTIC & ACIDOPHILUS EX ST) CAPS Take 1 capsule by mouth daily.    Marland Kitchen conjugated estrogens (PREMARIN) vaginal cream Place 1 Applicatorful vaginally 2 (two) times a week. Monday and Friday 42.5 g 2   No current facility-administered medications for this visit.     ALLERGIES: Tramadol; Chlorhexidine; Ciprofloxacin; Other; and Latex  Family History  Problem Relation Age of  Onset  . Heart attack Father   . Cancer - Lung Sister   . Breast cancer Sister 78  . Cancer - Lung Mother     History   Social History  . Marital Status: Married    Spouse Name: N/A    Number of Children: N/A  . Years of Education: N/A   Occupational History  . Not on file.   Social History Main Topics  . Smoking status: Never Smoker   . Smokeless tobacco: Never Used  . Alcohol Use: 0.0 oz/week    0 Not specified per week     Comment: occasional WINE  . Drug Use: No  . Sexual Activity:    Partners: Male    Birth Control/ Protection: Post-menopausal   Other Topics Concern  . Not on file   Social History Narrative    ROS:  Pertinent items are noted in HPI.  PHYSICAL  EXAMINATION:    BP 120/84 mmHg  Pulse 76  Resp 16  Ht 5\' 3"  (1.6 m)  Wt 120 lb (54.432 kg)  BMI 21.26 kg/m2  LMP 01/07/1996 (Approximate)     General appearance: alert, cooperative and appears stated age Lungs: clear to auscultation bilaterally Heart: regular rate and rhythm Abdomen: soft, non-tender; no masses,  no organomegaly No abnormal inguinal nodes palpated  Pelvic: External genitalia:  no lesions              Urethra:  normal appearing urethra with no masses, tenderness or lesions              Bartholins and Skenes: normal                 Vagina: normal appearing vagina with normal color and discharge, large patch of granulation tissue of the posterior vaginal wall, thicker on right than on left.  AgNO3 placed. Some mild bleeding noted.                ASSESSMENT  Vaginal granulation tissue.  Uterovaginal prolapse.   PLAN  Follow up in one week.  Bring pessary and vaginal estrogen cream to office visit next week to hopefully place the pessary.  If granulation tissue does not resolve, may need vaginal biopsy.      An After Visit Summary was printed and given to the patient.  __15____ minutes face to face time of which over 50% was spent in counseling.

## 2013-11-24 ENCOUNTER — Ambulatory Visit (INDEPENDENT_AMBULATORY_CARE_PROVIDER_SITE_OTHER): Payer: BC Managed Care – PPO | Admitting: Obstetrics and Gynecology

## 2013-11-24 ENCOUNTER — Encounter: Payer: Self-pay | Admitting: Obstetrics and Gynecology

## 2013-11-24 VITALS — BP 120/60 | HR 76 | Ht 63.0 in | Wt 120.2 lb

## 2013-11-24 DIAGNOSIS — N813 Complete uterovaginal prolapse: Secondary | ICD-10-CM

## 2013-11-24 NOTE — Progress Notes (Signed)
Patient ID: Tara Fernandez, female   DOB: 1950-04-30, 63 y.o.   MRN: [id number] GYNECOLOGY  VISIT   HPI: 63 y.o.   Married  Caucasian  female   G2P2002 with Patient's last menstrual period was 01/07/1996 (approximate).   here for 1 week follow up for recheck of vaginal granulation tissue and replacement of pessary with estrogen cream.  No bleeding or drainage.  Brought her own vaginal estrogen cream.   GYNECOLOGIC HISTORY: Patient's last menstrual period was 01/07/1996 (approximate). Contraception:  postmenopausal  Menopausal hormone therapy: Premarin cream        OB History    Gravida Para Term Preterm AB TAB SAB Ectopic Multiple Living   2 2 2       2          Patient Active Problem List   Diagnosis Date Noted  . Chondromalacia of right patellofemoral joint 05/18/2013  . Leg length discrepancy 04/15/2013  . Arthritis of right hip 02/11/2013  . Status post THR (total hip replacement) 02/11/2013  . Cystocele 11/19/2012  . Uterine prolapse 11/19/2012  . Degenerative arthritis of hip 06/17/2012    Past Medical History  Diagnosis Date  . Arthritis     osteoarthritis; PAIN RIGHT HIP  . Vaginal pessary present     PT HAS SMALL BLOOD BLISTER  - UP INSIDE VAGINA - PT'S GYN DOCTOR- DR LATHROP- AWARE -TOLD TO USE PREMARIN VAGINAL CREAM.  PT DOES NOT HAVE VAGINAL BLEEDING    Past Surgical History  Procedure Laterality Date  . Breast mass excision      Hx: of left breast  . Total hip arthroplasty Left 06/17/2012    Procedure: LEFT TOTAL HIP ARTHROPLASTY ANTERIOR APPROACH;  Surgeon: Mcarthur Rossetti, MD;  Location: [place];  Service: Orthopedics;  Laterality: Left;  . Cartilage surgery  10 th grade    RIGHT KNEE  . Total hip arthroplasty Right 02/11/2013    Procedure: RIGHT TOTAL HIP ARTHROPLASTY ANTERIOR APPROACH;  Surgeon: Mcarthur Rossetti, MD;  Location: [facility];  Service: Orthopedics;  Laterality: Right;    Current Outpatient Prescriptions  Medication Sig Dispense Refill   . Cholecalciferol (VITAMIN D PO) Take 10,000 Units by mouth daily.    Marland Kitchen conjugated estrogens (PREMARIN) vaginal cream Place 1 Applicatorful vaginally 2 (two) times a week. Monday and Friday 42.5 g 2  . D-MANNOSE PO Take 1 tablet by mouth daily. For urinary health    . EVENING PRIMROSE OIL PO Take 1,300 mg by mouth daily.    . Glucosamine HCl (GLUCOSAMINE PO) Take 1 tablet by mouth daily.    Marland Kitchen MAGNESIUM PO Take 1 tablet by mouth daily.    . NON FORMULARY daily. Marshmallow   HERB    . Nutritional Supplements (DHEA PO) Take 1 tablet by mouth daily.    Marland Kitchen OVER THE COUNTER MEDICATION Take 1 tablet by mouth daily. Herb:  Positive energy    . OVER THE COUNTER MEDICATION Apply 1 application topically every other day. Over the counter progesterone cream    . Potassium 99 MG TABS Take 99 mg by mouth daily.    . Probiotic Product (PROBIOTIC & ACIDOPHILUS EX ST) CAPS Take 1 capsule by mouth daily.     No current facility-administered medications for this visit.     ALLERGIES: Tramadol; Chlorhexidine; Ciprofloxacin; Other; and Latex  Family History  Problem Relation Age of Onset  . Heart attack Father   . Cancer - Lung Sister   . Breast cancer Sister 71  .  Cancer - Lung Mother     History   Social History  . Marital Status: Married    Spouse Name: N/A    Number of Children: N/A  . Years of Education: N/A   Occupational History  . Not on file.   Social History Main Topics  . Smoking status: Never Smoker   . Smokeless tobacco: Never Used  . Alcohol Use: 0.0 oz/week    0 Not specified per week     Comment: occasional WINE  . Drug Use: No  . Sexual Activity:    Partners: Male    Birth Control/ Protection: Post-menopausal   Other Topics Concern  . Not on file   Social History Narrative    ROS:  Pertinent items are noted in HPI.  PHYSICAL EXAMINATION:    BP 120/60 mmHg  Pulse 76  Ht 5\' 3"  (1.6 m)  Wt 120 lb 3.2 oz (54.522 kg)  BMI 21.30 kg/m2  LMP 01/07/1996  (Approximate)     General appearance: alert, cooperative and appears stated age Lungs: clear to auscultation bilaterally Heart: regular rate and rhythm Abdomen: soft, non-tender; no masses,  no organomegaly No abnormal inguinal nodes palpated  Pelvic: External genitalia:  no lesions              Urethra:  normal appearing urethra with no masses, tenderness or lesions              Bartholins and Skenes: normal                 Vagina: normal appearing vagina with normal color and discharge, granulation tissue of the vaginal posterior wall.               Cervix: normal appearance.                         ASSESSMENT  Granulation tissue of the vagina improved with AgNO3 treatments.   PLAN  Pessary ring with support replaced with Premarin cream 1/2 gram.  Patient is able to place and remove on her own.  Will use Premarin cream 1/2 gram pv on pessary twice weekly.  Return in 3 weeks for a recheck, sooner if needed.   An After Visit Summary was printed and given to the patient.  _10_____ minutes face to face time of which over 50% was spent in counseling.

## 2013-12-15 ENCOUNTER — Telehealth: Payer: Self-pay | Admitting: Obstetrics and Gynecology

## 2013-12-15 ENCOUNTER — Encounter: Payer: Self-pay | Admitting: Obstetrics and Gynecology

## 2013-12-15 ENCOUNTER — Ambulatory Visit (INDEPENDENT_AMBULATORY_CARE_PROVIDER_SITE_OTHER): Payer: BC Managed Care – PPO | Admitting: Obstetrics and Gynecology

## 2013-12-15 VITALS — BP 108/70 | HR 80 | Resp 16 | Ht 63.0 in | Wt 120.0 lb

## 2013-12-15 DIAGNOSIS — N765 Ulceration of vagina: Secondary | ICD-10-CM

## 2013-12-15 NOTE — Progress Notes (Signed)
GYNECOLOGY  VISIT   HPI: 63 y.o.   Married  Caucasian  female   G2P2002 with Patient's last menstrual period was 01/07/1996 (approximate).   here for Follow up No pain, drainage or bleeding.  Using estrogen cream twice weekly.   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  Husband not working.  Financial strains. Will not have insurance after January 06, 2014.   GYNECOLOGIC HISTORY: Patient's last menstrual period was 01/07/1996 (approximate). Contraception:  Post-Menopausal Menopausal hormone therapy: Premarin Vaginal Cream        OB History    Gravida Para Term Preterm AB TAB SAB Ectopic Multiple Living   2 2 2       2          Patient Active Problem List   Diagnosis Date Noted  . Chondromalacia of right patellofemoral joint 05/18/2013  . Leg length discrepancy 04/15/2013  . Arthritis of right hip 02/11/2013  . Status post THR (total hip replacement) 02/11/2013  . Cystocele 11/19/2012  . Uterine prolapse 11/19/2012  . Degenerative arthritis of hip 06/17/2012    Past Medical History  Diagnosis Date  . Arthritis     osteoarthritis; PAIN RIGHT HIP  . Vaginal pessary present     PT HAS SMALL BLOOD BLISTER  - UP INSIDE VAGINA - PT'S GYN DOCTOR- DR LATHROP- AWARE -TOLD TO USE PREMARIN VAGINAL CREAM.  PT DOES NOT HAVE VAGINAL BLEEDING    Past Surgical History  Procedure Laterality Date  . Breast mass excision      Hx: of left breast  . Total hip arthroplasty Left 06/17/2012    Procedure: LEFT TOTAL HIP ARTHROPLASTY ANTERIOR APPROACH;  Surgeon: Mcarthur Rossetti, MD;  Location: [place];  Service: Orthopedics;  Laterality: Left;  . Cartilage surgery  10 th grade    RIGHT KNEE  . Total hip arthroplasty Right 02/11/2013    Procedure: RIGHT TOTAL HIP ARTHROPLASTY ANTERIOR APPROACH;  Surgeon: Mcarthur Rossetti, MD;  Location: [facility];  Service: Orthopedics;  Laterality: Right;    Current Outpatient Prescriptions  Medication Sig Dispense Refill  . Cholecalciferol (VITAMIN D  PO) Take 10,000 Units by mouth daily.    Marland Kitchen conjugated estrogens (PREMARIN) vaginal cream Place 1 Applicatorful vaginally 2 (two) times a week. Monday and Friday 42.5 g 2  . D-MANNOSE PO Take 1 tablet by mouth daily. For urinary health    . EVENING PRIMROSE OIL PO Take 1,300 mg by mouth daily.    . Glucosamine HCl (GLUCOSAMINE PO) Take 1 tablet by mouth daily.    Marland Kitchen MAGNESIUM PO Take 1 tablet by mouth daily.    . NON FORMULARY daily. Marshmallow   HERB    . Nutritional Supplements (DHEA PO) Take 1 tablet by mouth daily.    Marland Kitchen OVER THE COUNTER MEDICATION Take 1 tablet by mouth daily. Herb:  Positive energy    . OVER THE COUNTER MEDICATION Apply 1 application topically every other day. Over the counter progesterone cream    . Potassium 99 MG TABS Take 99 mg by mouth daily.    . Probiotic Product (PROBIOTIC & ACIDOPHILUS EX ST) CAPS Take 1 capsule by mouth daily.     No current facility-administered medications for this visit.     ALLERGIES: Tramadol; Chlorhexidine; Ciprofloxacin; Other; and Latex  Family History  Problem Relation Age of Onset  . Heart attack Father   . Cancer - Lung Sister   . Breast cancer Sister 31  . Cancer - Lung Mother  History   Social History  . Marital Status: Married    Spouse Name: N/A    Number of Children: N/A  . Years of Education: N/A   Occupational History  . Not on file.   Social History Main Topics  . Smoking status: Never Smoker   . Smokeless tobacco: Never Used  . Alcohol Use: 0.0 oz/week    0 Not specified per week     Comment: occasional WINE  . Drug Use: No  . Sexual Activity:    Partners: Male    Birth Control/ Protection: Post-menopausal   Other Topics Concern  . Not on file   Social History Narrative    ROS:  Pertinent items are noted in HPI.  PHYSICAL EXAMINATION:    BP 108/70 mmHg  Pulse 80  Resp 16  Ht 5\' 3"  (1.6 m)  Wt 120 lb (54.432 kg)  BMI 21.26 kg/m2  LMP 01/07/1996 (Approximate)     General appearance:  alert, cooperative and appears stated age   Pelvic: External genitalia:  no lesions              Urethra:  normal appearing urethra with no masses, tenderness or lesions              Bartholins and Skenes: normal                 Vagina: normal appearing vagina with normal color and discharge, no lesions.  2 x 3 cm ulceration of the posterior vaginal mucosa just below the cervix.              Cervix: normal appearance            ASSESSMENT  Nonhealing vaginal ulceration.  Status post cautery.  Using vaginal estrogen treatment.  Pessary use.   PLAN  Return for vaginal biopsy prior to the end of the year.  OK to continue pessary use and vaginal estrogen cream twice weekly.     An After Visit Summary was printed and given to the patient.  ___15___ minutes face to face time of which over 50% was spent in counseling.

## 2013-12-15 NOTE — Telephone Encounter (Signed)
Spoke with patient and biopsy scheduled for 12/22/13. Patient very anxious to have procedure done as soon as possible as losing insurance coverage. May need time to obtain treatment after biopsy. Scheduled procedure and patient advised motrin 800 mg po one hour before with food. Patient agreeable and verbalized understanding of instructions.

## 2013-12-15 NOTE — Telephone Encounter (Signed)
Pt states we are going to call her in a couple of days for biopsy scheduling. Would like [place] to call her mobile number only. She will be in and out of the house quite a bit and will call back if voicemail left.  [phone]  bf

## 2013-12-19 ENCOUNTER — Telehealth: Payer: Self-pay | Admitting: Obstetrics and Gynecology

## 2013-12-19 NOTE — Telephone Encounter (Signed)
Pt has a question regarding her appointment on Thursday.

## 2013-12-19 NOTE — Telephone Encounter (Signed)
Spoke with patient. Patient is scheduled for vaginal biopsy on 12/22/13 with Dr.Silva. Patient states that she thinks she has had a biopsy before with Dr.Lathrop. "I just want to make sure that I did not already have one. I could be wrong though." Advised patient went through all appointment notes and lab results and do not see that patient had a biopsy with Dr.Lathop. Patient is agreeable. "I may have just been thinking I did." Advised would send a message to Dr.Silva for review as an extra pair of eyes to double check and will return call with any further information. Patient is agreeable and would like voicemail left if she is unable to answer.

## 2013-12-21 NOTE — Telephone Encounter (Signed)
I do not see any vaginal biopsy result in EPIC. I recommend proceeding.

## 2013-12-21 NOTE — Telephone Encounter (Signed)
Spoke with patient. Advised patient of message as seen below from [place]. Patient is agreeable and verbalizes understanding. Will keep appointment as scheduled for tomorrow 12/17 with Dr.Silva.  Routing to provider for final review. Patient agreeable to disposition. Will close encounter

## 2013-12-22 ENCOUNTER — Encounter: Payer: Self-pay | Admitting: Obstetrics and Gynecology

## 2013-12-22 ENCOUNTER — Ambulatory Visit (INDEPENDENT_AMBULATORY_CARE_PROVIDER_SITE_OTHER): Payer: BC Managed Care – PPO | Admitting: Obstetrics and Gynecology

## 2013-12-22 DIAGNOSIS — N765 Ulceration of vagina: Secondary | ICD-10-CM

## 2013-12-22 NOTE — Progress Notes (Signed)
Patient ID: Tara Fernandez, female   DOB: 1950/01/25, 63 y.o.   MRN: [id number] GYNECOLOGY  VISIT   HPI: 63 y.o.   Married  Caucasian  female   G2P2002 with Patient's last menstrual period was 01/07/1996 (approximate).   here for vaginal biopsy.  See separate note below for diagram.   Having headaches, dizziness, and cramping with Premarin cream.  Now symptoms resolved.   GYNECOLOGIC HISTORY: Patient's last menstrual period was 01/07/1996 (approximate). Contraception:   postmenopausal Menopausal hormone therapy: Premarin vaginal cream        OB History    Gravida Para Term Preterm AB TAB SAB Ectopic Multiple Living   2 2 2       2          Patient Active Problem List   Diagnosis Date Noted  . Chondromalacia of right patellofemoral joint 05/18/2013  . Leg length discrepancy 04/15/2013  . Arthritis of right hip 02/11/2013  . Status post THR (total hip replacement) 02/11/2013  . Cystocele 11/19/2012  . Uterine prolapse 11/19/2012  . Degenerative arthritis of hip 06/17/2012    Past Medical History  Diagnosis Date  . Arthritis     osteoarthritis; PAIN RIGHT HIP  . Vaginal pessary present     PT HAS SMALL BLOOD BLISTER  - UP INSIDE VAGINA - PT'S GYN DOCTOR- DR LATHROP- AWARE -TOLD TO USE PREMARIN VAGINAL CREAM.  PT DOES NOT HAVE VAGINAL BLEEDING    Past Surgical History  Procedure Laterality Date  . Breast mass excision      Hx: of left breast  . Total hip arthroplasty Left 06/17/2012    Procedure: LEFT TOTAL HIP ARTHROPLASTY ANTERIOR APPROACH;  Surgeon: Mcarthur Rossetti, MD;  Location: [place];  Service: Orthopedics;  Laterality: Left;  . Cartilage surgery  10 th grade    RIGHT KNEE  . Total hip arthroplasty Right 02/11/2013    Procedure: RIGHT TOTAL HIP ARTHROPLASTY ANTERIOR APPROACH;  Surgeon: Mcarthur Rossetti, MD;  Location: [facility];  Service: Orthopedics;  Laterality: Right;    Current Outpatient Prescriptions  Medication Sig Dispense Refill  . Cholecalciferol  (VITAMIN D PO) Take 10,000 Units by mouth daily.    Marland Kitchen conjugated estrogens (PREMARIN) vaginal cream Place 1 Applicatorful vaginally 2 (two) times a week. Monday and Friday 42.5 g 2  . D-MANNOSE PO Take 1 tablet by mouth daily. For urinary health    . EVENING PRIMROSE OIL PO Take 1,300 mg by mouth daily.    . Glucosamine HCl (GLUCOSAMINE PO) Take 1 tablet by mouth daily.    Marland Kitchen MAGNESIUM PO Take 1 tablet by mouth daily.    . NON FORMULARY daily. Marshmallow   HERB    . Nutritional Supplements (DHEA PO) Take 1 tablet by mouth daily.    Marland Kitchen OVER THE COUNTER MEDICATION Take 1 tablet by mouth daily. Herb:  Positive energy    . OVER THE COUNTER MEDICATION Apply 1 application topically every other day. Over the counter progesterone cream    . Potassium 99 MG TABS Take 99 mg by mouth daily.    . Probiotic Product (PROBIOTIC & ACIDOPHILUS EX ST) CAPS Take 1 capsule by mouth daily.     No current facility-administered medications for this visit.     ALLERGIES: Tramadol; Chlorhexidine; Ciprofloxacin; Other; and Latex  Family History  Problem Relation Age of Onset  . Heart attack Father   . Cancer - Lung Sister   . Breast cancer Sister 89  . Cancer - Lung Mother  History   Social History  . Marital Status: Married    Spouse Name: N/A    Number of Children: N/A  . Years of Education: N/A   Occupational History  . Not on file.   Social History Main Topics  . Smoking status: Never Smoker   . Smokeless tobacco: Never Used  . Alcohol Use: 0.0 oz/week    0 Not specified per week     Comment: occasional WINE  . Drug Use: No  . Sexual Activity:    Partners: Male    Birth Control/ Protection: Post-menopausal   Other Topics Concern  . Not on file   Social History Narrative    ROS:  Pertinent items are noted in HPI.  PHYSICAL EXAMINATION:    BP 130/76 mmHg  Pulse 66  Ht 5\' 3"  (1.6 m)  Wt 119 lb 3.2 oz (54.069 kg)  BMI 21.12 kg/m2  LMP 01/07/1996 (Approximate)     General  appearance: alert, cooperative and appears stated age   ASSESSMENT  Intolerance to vaginal Premarin cream.   PLAN  Stop Premarin.   An After Visit Summary was printed and given to the patient.  _15_____ minutes face to face time of which over 50% was spent in counseling.

## 2013-12-22 NOTE — Progress Notes (Signed)
Subjective:     Patient ID: Tara Fernandez, female   DOB: 08-23-1950, 63 y.o.   MRN: [id number]  HPI  Patient is here for vaginal biopsy due to chronic ulceration with pessary use.   Review of Systems     Objective:   Physical Exam  Genitourinary:     Consent for procedure. Speculum placed. 3% acetic acid used.  Colposcopy of vagina performed.  2.5 cm inflamed and ulcerated area of the posterior vaginal mucosa.  Central area of ulceration surrounded by acetowhite changes.  Biopsy of region at 6:00 and then 8:00 sent to [facility] in separate specimen bottles.  Monsel's placed.  Minimal EBL.  No complications.     Assessment:     Persistent vaginal ulceration.  Pessary user.  Intolerance of Premarin cream.     Plan:     Follow up of biopsies.  No use of pessary until bleeding stops.  Stop Premarin cream use.   15 minutes face to face time of which over 50% was spent in counseling.   After visit summary to patient.

## 2013-12-25 ENCOUNTER — Encounter: Payer: Self-pay | Admitting: Obstetrics and Gynecology

## 2014-01-04 ENCOUNTER — Telehealth: Payer: Self-pay | Admitting: Obstetrics and Gynecology

## 2014-01-04 ENCOUNTER — Telehealth: Payer: Self-pay | Admitting: *Deleted

## 2014-01-04 NOTE — Telephone Encounter (Signed)
Call to [facility] for appointment. Left message that patient request they call her directly to schedule appointment due to her complicated schedule. Request they call us back to let us know when appointment is.

## 2014-01-04 NOTE — Telephone Encounter (Signed)
Call from Maudie Mercury at [facility], they received referral info and have called patient. Left a message fro her to call them back. They will notify us once they have her scheduled.

## 2014-01-04 NOTE — Telephone Encounter (Signed)
Call from [place] office. Patient scheduled to see Dr Denman George on 01-16-14 at 145 pm.  Routing to provider for final review. Patient agreeable to disposition. Will close encounter

## 2014-01-04 NOTE — Telephone Encounter (Signed)
Phone call to patient regarding vaginal biopsies showing atypia concerning for underlying dysplasia.   Will refer to GYN ONC at the [facility].   Plan discussed with patient.   I also released her biopsy results to her just now through My Chart.

## 2014-01-16 ENCOUNTER — Ambulatory Visit: Payer: BLUE CROSS/BLUE SHIELD | Attending: Gynecologic Oncology | Admitting: Gynecologic Oncology

## 2014-01-16 ENCOUNTER — Encounter: Payer: Self-pay | Admitting: Gynecologic Oncology

## 2014-01-16 VITALS — BP 117/71 | HR 74 | Temp 98.8°F | Resp 16 | Ht 63.0 in | Wt 123.2 lb

## 2014-01-16 DIAGNOSIS — Z780 Asymptomatic menopausal state: Secondary | ICD-10-CM | POA: Insufficient documentation

## 2014-01-16 DIAGNOSIS — M1611 Unilateral primary osteoarthritis, right hip: Secondary | ICD-10-CM | POA: Insufficient documentation

## 2014-01-16 DIAGNOSIS — N814 Uterovaginal prolapse, unspecified: Secondary | ICD-10-CM | POA: Diagnosis not present

## 2014-01-16 DIAGNOSIS — Z79899 Other long term (current) drug therapy: Secondary | ICD-10-CM | POA: Diagnosis not present

## 2014-01-16 DIAGNOSIS — N765 Ulceration of vagina: Secondary | ICD-10-CM | POA: Insufficient documentation

## 2014-01-16 NOTE — Progress Notes (Signed)
Consult Note: Gyn-Onc  Consult was requested by Dr. Quincy Simmonds for the evaluation of Tara Fernandez 64 y.o. female with persistent vaginal ulceration  CC:  Chief Complaint  Patient presents with  . vaginal ulceration    Assessment/Plan:  Ms. Tara Fernandez  is a 64 y.o.  year old O2 was seen in consultation at the request of Dr. Quincy Simmonds for a persistent vaginal ulceration with concern for possible underlying dysplasia. Upon review of the patient's history and physical examination and evaluation of the pathology report I feel that this ulcer is most consistent with inflammatory changes and unlikely represents dysplasia. Tara Fernandez has no personal history for abnormal Pap smears, tobacco use, or HPV infection. The appearance of the lesion is most consistent with an inflammatory change. I believe its persistence is likely secondary to a slightly suboptimal shape or size of her pessary. I agree with the prior plan for attempted Premarin application as this is typically the best remedy however she experienced severe side effects with Premarin use and declines subsequent attempts.  I applied silver nitrate to aiding Cordery of the lesion today per patient request. I feel that vaginal laser is unnecessary given the degree of ulceration, my suspicion for underlying dysplasia, and the potential side effects with synaechie formation and dyspareunia. Instead I recommend reevaluation of her current pessary and repeated attempts at cautery as the patient reports that this helped in the past. At her next scheduled vaginal and cervical cytology the addition of HPV testing will also help stratify this patient's low risk.   HPI: Tara Fernandez is a 64 year old woman with a long-standing history of uterine prolapse requiring pessary use. Approximately one year ago she developed a right posterior vaginal fornix also after she upsized her vaginal pessary. This was initially treated with silver nitrate cautery in the office and she had  resolution. Approximate 6 months later after change again in her pessary she was noted to have re-formation of the also. At this time she was prescribed vaginal Premarin however she discontinued this when she developed symptoms of dizziness chest cough and weakness. The lesion was again cauterized and biopsied this time with pathology taken revealing inflamed squamous mucosa with epithelial atypia. Immunohistochemistry stains to P 16 were negative however the Ki-67 stains were positive with this pattern being inconclusive for inflammation versus dysplasia.   The patient works as a Armed forces training and education officer and does not desire surgical intervention for her prolapse as she cannot take time off work for recovery. She reports no abnormal vaginal discharge, no bleeding, no dyspareunia, postcoital bleeding and no abdominal pelvic symptoms.   Current Meds:  Outpatient Encounter Prescriptions as of 01/16/2014  Medication Sig  . Cholecalciferol (VITAMIN D PO) Take 10,000 Units by mouth daily.  . D-MANNOSE PO Take 1 tablet by mouth daily. For urinary health  . EVENING PRIMROSE OIL PO Take 1,300 mg by mouth daily.  . Glucosamine HCl (GLUCOSAMINE PO) Take 1 tablet by mouth daily.  Marland Kitchen MAGNESIUM PO Take 1 tablet by mouth daily.  . Nutritional Supplements (DHEA PO) Take 1 tablet by mouth daily.  Marland Kitchen OVER THE COUNTER MEDICATION Take 1 tablet by mouth daily. Herb:  Positive energy  . OVER THE COUNTER MEDICATION Apply 1 application topically every other day. Over the counter progesterone cream  . Potassium 99 MG TABS Take 99 mg by mouth daily.  . [DISCONTINUED] NON FORMULARY daily. Marshmallow   HERB  . [DISCONTINUED] Probiotic Product (PROBIOTIC & ACIDOPHILUS EX ST) CAPS Take 1 capsule by mouth daily.  Allergy:  Allergies  Allergen Reactions  . Tramadol Other (See Comments)    Causes numbness in hands and right side of body  . Chlorhexidine Itching  . Ciprofloxacin Other (See Comments)    "joint pain"  . Other      PT STATES SHE CAN TAKE HYDROCODONE/ACETAMINOPHEN FOR PAIN  WITH OUT ANY PROBLEMS.  STATES - IN THE PAST - OTHER PAIN MEDS CAUSED NAUSEA OR RASH OR FAST HEART BEAT - BUT SHE DOESN'T KNOW NAMES OF THOSE MEDS   STATES NO PROBLEMS WITH ANY PAIN MEDS GIVEN IN HOSPITAL.  Marland Kitchen Premarin [Conjugated Estrogens] Other (See Comments)    Headache, dizziness, and pelvic cramping.  . Latex Rash    HIVES - AFTER DENTIST USED LATEX GLOVES    Social Hx:   History   Social History  . Marital Status: Married    Spouse Name: N/A    Number of Children: N/A  . Years of Education: N/A   Occupational History  . Not on file.   Social History Main Topics  . Smoking status: Never Smoker   . Smokeless tobacco: Never Used  . Alcohol Use: 0.0 oz/week    0 Not specified per week     Comment: occasional WINE  . Drug Use: No  . Sexual Activity:    Partners: Male    Birth Control/ Protection: Post-menopausal   Other Topics Concern  . Not on file   Social History Narrative    Past Surgical Hx:  Past Surgical History  Procedure Laterality Date  . Breast mass excision      Hx: of left breast  . Total hip arthroplasty Left 06/17/2012    Procedure: LEFT TOTAL HIP ARTHROPLASTY ANTERIOR APPROACH;  Surgeon: Mcarthur Rossetti, MD;  Location: [place];  Service: Orthopedics;  Laterality: Left;  . Cartilage surgery  10 th grade    RIGHT KNEE  . Total hip arthroplasty Right 02/11/2013    Procedure: RIGHT TOTAL HIP ARTHROPLASTY ANTERIOR APPROACH;  Surgeon: Mcarthur Rossetti, MD;  Location: [facility];  Service: Orthopedics;  Laterality: Right;    Past Medical Hx:  Past Medical History  Diagnosis Date  . Arthritis     osteoarthritis; PAIN RIGHT HIP  . Vaginal pessary present     PT HAS SMALL BLOOD BLISTER  - UP INSIDE VAGINA - PT'S GYN DOCTOR- DR LATHROP- AWARE -TOLD TO USE PREMARIN VAGINAL CREAM.  PT DOES NOT HAVE VAGINAL BLEEDING    Past Gynecological History:  G2 SVD  Patient's last menstrual period was  01/07/1996 (approximate).  Family Hx:  Family History  Problem Relation Age of Onset  . Heart attack Father   . Cancer - Lung Sister   . Breast cancer Sister 40  . Cancer - Lung Mother     Review of Systems:  Constitutional  Feels well,    ENT Normal appearing ears and nares bilaterally Skin/Breast  No rash, sores, jaundice, itching, dryness Cardiovascular  No chest pain, shortness of breath, or edema  Pulmonary  No cough or wheeze.  Gastro Intestinal  No nausea, vomitting, or diarrhoea. No bright red blood per rectum, no abdominal pain, change in bowel movement, or constipation.  Genito Urinary  No frequency, urgency, dysuria, see HPI Musculo Skeletal  No myalgia, arthralgia, joint swelling or pain  Neurologic  No weakness, numbness, change in gait,  Psychology  No depression, anxiety, insomnia.   Vitals:  Blood pressure 117/71, pulse 74, temperature 98.8 F (37.1 C), temperature source Oral, resp.  rate 16, height _0  (1.6 m), weight 123 lb 3.2 oz (55.883 kg), last menstrual period 01/07/1996.  Physical Exam: WD in NAD Skin  No rash/lesions/breakdown  Psychiatry  Alert and oriented to person, place, and time  Genito Urinary  Vulva/vagina: Normal external female genitalia.  No lesions. No discharge or bleeding.  Bladder/urethra:  No lesions or masses, well supported bladder  Vagina: 3cm area of erythema with intact overlying mucosa posterior to cervix and extending to patient's right vaginal fornix. Silver nitrate applied. Non raised and not nodular. Significant prolapse with anterior vaginal wall present protruding past introitus.  Cervix: Normal appearing, no lesions.  Uterus: Small, mobile, no parametrial involvement or nodularity.  Adnexa: no palpable masses. Rectal  Good tone, no masses no cul de sac nodularity.  Extremities  No bilateral cyanosis, clubbing or edema.   Donaciano Eva, MD   01/16/2014, 2:22 PM

## 2014-01-16 NOTE — Patient Instructions (Signed)
Plan to follow up with Dr. Quincy Simmonds.

## 2014-09-15 ENCOUNTER — Telehealth: Payer: Self-pay | Admitting: Obstetrics & Gynecology

## 2014-09-15 DIAGNOSIS — N950 Postmenopausal bleeding: Secondary | ICD-10-CM

## 2014-09-15 NOTE — Telephone Encounter (Addendum)
Spoke with patient. She states she has been having very slight bleeding (spotting) on toilet paper each day since Wednesday. She removed her pessary last night and replaced it and noticed blood on pessary at removal.  Patient states "I feel just like I am going to start my cycle." Reports light pelvic cramping but no pain, has not needed to take Motrin for cramps. Denies urinary symptoms.  Patient requests specifically to see Dr. Sabra Heck. Declines office visit with any provider at earlier time.   Office visit with Dr. Sabra Heck scheduled for Monday 09/18/14 at 1500.  Patient has history of vaginal ulceration with pessary. Prior evaluation by Dr. Denman George.  Advised patient after examination may need Endometrial biopsy patient agreeable if necessary.   Order placed for pre-cert. Pre-cert only cc Kerry Hough.   Patient advised to call our office or seek emergency care at nearest emergency department if develops heavy vaginal bleeding or any concerning symptoms. Patient agreeable.  Routing to provider for final review. Patient agreeable to disposition. Will close encounter.

## 2014-09-15 NOTE — Telephone Encounter (Addendum)
Patient says Tara Fernandez went to the restroom Wednesday and when Tara Fernandez wiped Tara Fernandez noticed bright red bleeding. Would like to come in to see Dr. Sabra Heck if possible. Best [phone] (No Chart)

## 2014-09-18 ENCOUNTER — Ambulatory Visit (INDEPENDENT_AMBULATORY_CARE_PROVIDER_SITE_OTHER): Payer: BLUE CROSS/BLUE SHIELD | Admitting: Obstetrics & Gynecology

## 2014-09-18 VITALS — BP 110/74 | HR 72 | Resp 14 | Wt 123.0 lb

## 2014-09-18 DIAGNOSIS — N814 Uterovaginal prolapse, unspecified: Secondary | ICD-10-CM

## 2014-09-18 DIAGNOSIS — N898 Other specified noninflammatory disorders of vagina: Secondary | ICD-10-CM | POA: Diagnosis not present

## 2014-09-18 DIAGNOSIS — N8110 Cystocele, unspecified: Secondary | ICD-10-CM

## 2014-09-18 DIAGNOSIS — N950 Postmenopausal bleeding: Secondary | ICD-10-CM

## 2014-09-18 DIAGNOSIS — Z960 Presence of urogenital implants: Secondary | ICD-10-CM

## 2014-09-18 DIAGNOSIS — Z9889 Other specified postprocedural states: Secondary | ICD-10-CM | POA: Diagnosis not present

## 2014-09-18 DIAGNOSIS — IMO0002 Reserved for concepts with insufficient information to code with codable children: Secondary | ICD-10-CM

## 2014-09-18 NOTE — Progress Notes (Signed)
Subjective:     Patient ID: Tara Fernandez, female   DOB: Feb 13, 1950, 64 y.o.   MRN: [id number]  HPI 43 G2P2 MWF here for complaint of vaginal bleeding.  This has been an intermittent problem due to pessary use and history of pelvic prolapse with cystocele.  Pt has been using a #3 3/4 gelhorn pessary with short stem for several years.  She has developed (with review of records) appears to be an area of granulation tissue.  This has been biopsied by Dr. Quincy Simmonds.  she did have consultation with Dr. Denman George 1/16 as well.  Pt reports using OTC progesterone cream and feeling "hormonal" and like she might start her period.  This is primarily why she called for the appt as she has been having intermittent bleeding with the pessary.  She reports she has been "rubbing the cream all over herself" and now realizes this might be part of the problem.  She stopped using it a couple of days ago and already feels less hormonal and the cramping has resolved.   Denies pelvic pain, bowel or bladder changes.  Neg pap with neg HR HPV 10/15/12.   Review of Systems  All other systems reviewed and are negative.      Objective:   Physical Exam  Constitutional: She is oriented to person, place, and time. She appears well-developed and well-nourished.  Abdominal: Soft. Bowel sounds are normal. She exhibits no distension. There is no tenderness. There is no rebound and no guarding.  Genitourinary: There is no rash, tenderness, lesion or injury on the right labia. There is no rash, tenderness, lesion or injury on the left labia. Cervix exhibits no motion tenderness and no discharge. Right adnexum displays no mass, no tenderness and no fullness. Left adnexum displays no mass, no tenderness and no fullness.    Lymphadenopathy:       Right: No inguinal adenopathy present.       Left: No inguinal adenopathy present.  Neurological: She is alert and oriented to person, place, and time.  Skin: Skin is warm and dry.  Psychiatric: She  has a normal mood and affect.   Prior note from Dr. Quincy Simmonds shows similar finding and this is where the biopsy was also performed.    Assessment:     Pelvic prolapse with uterine prolapse and cystocele Granulation tissue beneath cervix--source of bleeding.  Pt declines additional estrogen cream use due to prior side effects    Plan:     Will order short stemmed Gelhorn pessary, soft one, 2 3/4 for pt to see if this helps with the granulation tissue.  As well use of oil product, like coconut oil, discussed.  Pt is using a lubricant but this is probably not providing any moisture for vaginal apex.  Pt should schedule appt for AEX.

## 2014-09-19 ENCOUNTER — Encounter: Payer: Self-pay | Admitting: Obstetrics & Gynecology

## 2014-09-19 DIAGNOSIS — Z960 Presence of urogenital implants: Secondary | ICD-10-CM | POA: Insufficient documentation

## 2014-10-09 ENCOUNTER — Telehealth: Payer: Self-pay | Admitting: Obstetrics and Gynecology

## 2014-10-09 NOTE — Telephone Encounter (Signed)
Patient is calling to see if her pessary has come in yet?

## 2014-10-09 NOTE — Telephone Encounter (Signed)
Spoke with patient. Advised patient that her pessary has come in. Patient states she is replacing her current pessary with the new one and will just need to pick this up. Advised this will be left for pick up at the front for her. She will pick this up tomorrow.  Routing to provider for final review. Patient agreeable to disposition. Will close encounter.

## 2014-10-25 ENCOUNTER — Encounter: Payer: Self-pay | Admitting: Family Medicine

## 2014-10-25 ENCOUNTER — Ambulatory Visit (INDEPENDENT_AMBULATORY_CARE_PROVIDER_SITE_OTHER): Payer: BLUE CROSS/BLUE SHIELD | Admitting: Family Medicine

## 2014-10-25 VITALS — BP 129/73 | HR 85 | Ht 64.0 in | Wt 123.0 lb

## 2014-10-25 DIAGNOSIS — M2170 Unequal limb length (acquired), unspecified site: Secondary | ICD-10-CM

## 2014-10-25 NOTE — Assessment & Plan Note (Signed)
Leg length 77 cm B/L today  - 5/16" heel lift removed.   - Readjusted her MT pad for her metatarsalgia.  - F/U PRN or if needs new orthotics.

## 2014-10-25 NOTE — Progress Notes (Signed)
Patient ID: Tara Fernandez, female   DOB: 1950/12/25, 64 y.o.   MRN: [id number] 63 you female presents for f/u custom orthotics.  She has had custom orthotics now for 1.5 years and had her b/l hips replaced about 2 years ago by Dr. Ninfa Linden at [place].  She feels as if the L one is slightly off since this is the one with a 5/16" heel lift and MT pad.    PMHx, PFHx, PSHx reviewed.  ROS 12 point negative other than per HPI.   Examination: BP 129/73 mmHg  Pulse 85  Ht 5\' 4"  (1.626 m)  Wt 123 lb (55.792 kg)  BMI 21.10 kg/m2  LMP 01/07/1996 (Approximate) Well developed well nourished 64 yo female in NAD.  Leg length: 77 cm B/L ASIS to MM  Feet: Bunion deformity left foot, arch collapse with excessive pronation bilaterally, varus deformity to the second through fifth ray of the right foot.

## 2014-10-30 ENCOUNTER — Ambulatory Visit: Payer: BLUE CROSS/BLUE SHIELD | Admitting: Family Medicine

## 2014-11-01 ENCOUNTER — Ambulatory Visit: Payer: BLUE CROSS/BLUE SHIELD | Admitting: Family Medicine

## 2014-12-13 ENCOUNTER — Ambulatory Visit: Payer: BLUE CROSS/BLUE SHIELD | Attending: Rehabilitation | Admitting: Physical Therapy

## 2014-12-14 ENCOUNTER — Encounter: Payer: Self-pay | Admitting: Obstetrics & Gynecology

## 2014-12-14 ENCOUNTER — Ambulatory Visit (INDEPENDENT_AMBULATORY_CARE_PROVIDER_SITE_OTHER): Payer: BLUE CROSS/BLUE SHIELD | Admitting: Obstetrics & Gynecology

## 2014-12-14 VITALS — BP 120/76 | HR 80 | Resp 20 | Ht 64.0 in | Wt 124.0 lb

## 2014-12-14 DIAGNOSIS — N950 Postmenopausal bleeding: Secondary | ICD-10-CM

## 2014-12-14 DIAGNOSIS — N8110 Cystocele, unspecified: Secondary | ICD-10-CM

## 2014-12-14 DIAGNOSIS — IMO0002 Reserved for concepts with insufficient information to code with codable children: Secondary | ICD-10-CM

## 2014-12-14 DIAGNOSIS — N814 Uterovaginal prolapse, unspecified: Secondary | ICD-10-CM | POA: Diagnosis not present

## 2014-12-15 ENCOUNTER — Telehealth: Payer: Self-pay | Admitting: Obstetrics & Gynecology

## 2014-12-15 NOTE — Telephone Encounter (Signed)
Spoke with patient. She states that after office visit yesterday with Dr. Sabra Heck she has spoken with her husband and wants to plan a consult with Dr. Sabra Heck to discuss surgical options.  She would like to plan for surgery for the last week of January. Consult scheduled with Dr. Sabra Heck for 12/22/14 at 1530 and patient agreeable.  Routing to provider for final review. Patient agreeable to disposition. Will close encounter.     Cc Lamont Snowball, RN

## 2014-12-15 NOTE — Telephone Encounter (Signed)
Patient thinks she may want a partial hysterectomy. Last seen 12/14/14.

## 2014-12-18 ENCOUNTER — Ambulatory Visit: Payer: BLUE CROSS/BLUE SHIELD

## 2014-12-19 ENCOUNTER — Ambulatory Visit: Payer: BLUE CROSS/BLUE SHIELD | Admitting: Rehabilitation

## 2014-12-20 ENCOUNTER — Ambulatory Visit: Payer: BLUE CROSS/BLUE SHIELD

## 2014-12-21 NOTE — Progress Notes (Signed)
GYNECOLOGY  VISIT   HPI: 64 y.o. Married Caucasian female G2P2002 here with complaint of increased vaginal discharge and odor as well as some minimal bleeding.  Pt has been using a Gelhorn pessary for her pelvic prolapse and cystocele with good support of her prolapse.  She cleans houses for a living and just cannot proceed with any surgical intervention at this time.  Pt has seen Dr. Denman George in consultation, has undergone a vaginal biopsy of an area of granulation tissue.  She's used vaginal estrogen cream and progesterone cream topically and has experienced side effects with both.  She really does not want to use hormonal therapy.  Denies change in bowel or bladder habits.  No pelvic or back pain.  Denies fever.  Pt feels like symptoms really changed after starting to use a softer Gelhorn pessary that I ordered for her in September.  Last Pap 10/14 neg with neg HR HPV.  GYNECOLOGIC HISTORY: Patient's last menstrual period was 01/07/1996 (approximate). Contraception: PMP        Patient Active Problem List   Diagnosis Date Noted  . Presence of pessary 09/19/2014  . Chondromalacia of right patellofemoral joint 05/18/2013  . Leg length discrepancy 04/15/2013  . Arthritis of right hip 02/11/2013  . Status post THR (total hip replacement) 02/11/2013  . Cystocele 11/19/2012  . Uterine prolapse 11/19/2012  . Degenerative arthritis of hip 06/17/2012    Past Medical History  Diagnosis Date  . Arthritis     osteoarthritis; PAIN RIGHT HIP  . Vaginal pessary present     PT HAS SMALL BLOOD BLISTER  - UP INSIDE VAGINA - PT'S GYN DOCTOR- DR LATHROP- AWARE -TOLD TO USE PREMARIN VAGINAL CREAM.  PT DOES NOT HAVE VAGINAL BLEEDING    ALLERGIES: Tramadol; Chlorhexidine; Ciprofloxacin; Other; Premarin; and Latex  Family History  Problem Relation Age of Onset  . Heart attack Father   . Cancer - Lung Sister   . Breast cancer Sister 51  . Cancer - Lung Mother    SH:  Non smoker  Review of Systems   All other systems reviewed and are negative.   PHYSICAL EXAMINATION:    BP 120/76 mmHg  Pulse 80  Resp 20  Ht 5\' 4"  (1.626 m)  Wt 124 lb (56.246 kg)  BMI 21.27 kg/m2  LMP 01/07/1996 (Approximate)    General appearance: alert, cooperative and appears stated age Abdomen: soft, non-tender; bowel sounds normal; no masses,  no organomegaly  Pelvic: External genitalia:  no lesions              Urethra:  normal appearing urethra with no masses, tenderness or lesions              Bartholins and Skenes: normal                 Vagina: normal appearing vagina with normal color and discharge, pessary removed without difficulty, there is significant bleeding and pressure/rubbing ulcerations all around the vagina.  Pessary cleansed but left out, cystocele present              Cervix: no lesions              Bimanual Exam:  Uterus:  normal size, contour, position, consistency, mobility, non-tender and prolapsed third degree              Adnexa: no mass, fullness, tenderness              Rectovaginal: No  Anus:  normal sphincter tone, no lesions (Fair amount of blood on my blood after my exam.)  Chaperone was present for exam.  ASSESSMENT Uterine prolapse with cystocele Pessary use Vaginal ulcerations from pessary use Side effects with prior estrogen and progesterone cream use   PLAN Highly encouraged pt to leave out pessary for at least a month and then let me look at the vagina to see about healing.  Pt just is NOT interested in surgery at this time.  She should go back to using the older, stiffer pessary once the vagina heals as I feel this will decrease the change of current bleeding occuring again.

## 2014-12-22 ENCOUNTER — Ambulatory Visit (INDEPENDENT_AMBULATORY_CARE_PROVIDER_SITE_OTHER): Payer: BLUE CROSS/BLUE SHIELD | Admitting: Obstetrics & Gynecology

## 2014-12-22 ENCOUNTER — Encounter: Payer: Self-pay | Admitting: Obstetrics & Gynecology

## 2014-12-22 VITALS — BP 118/86 | HR 68 | Resp 18 | Ht 64.0 in | Wt 125.0 lb

## 2014-12-22 DIAGNOSIS — N8110 Cystocele, unspecified: Secondary | ICD-10-CM | POA: Diagnosis not present

## 2014-12-22 DIAGNOSIS — Z9289 Personal history of other medical treatment: Secondary | ICD-10-CM | POA: Diagnosis not present

## 2014-12-22 DIAGNOSIS — IMO0002 Reserved for concepts with insufficient information to code with codable children: Secondary | ICD-10-CM

## 2014-12-22 DIAGNOSIS — N814 Uterovaginal prolapse, unspecified: Secondary | ICD-10-CM

## 2014-12-22 DIAGNOSIS — Z960 Presence of urogenital implants: Secondary | ICD-10-CM

## 2014-12-25 NOTE — Progress Notes (Signed)
Subjective:     Patient ID: Tara Fernandez, female   DOB: [birth year], 64 y.o.   MRN: [id number]  HPI 64 yo G2P2 MWF with hx of symptomatic cystocele and incomplete uterine prolapse here to discuss surgical options.  Her husband accompanies her today.  She has seen Dr. Quincy Simmonds in the past as well for this.  Pt has been using a Gelhorn pessary but at last visit in 12/14/14, she had significant ulcerations around the entire vagina.  She was trying a new one when this occurred.  She's back using the "old one" and feels the bleeding and discharge has significantly improved over the last week.  Pt has used vaginal estrogen in the past to try and help with this problem but she is symptomatic with the estrogen and declines estrogen use in the future.  Pt has contemplated hysterectomy.  She wants to know "what else" may need to be done for a good repair.  D/W pt urodynamics will be needed for evaluation of possible sling.  Anterior repair with possible biologic graft would be optimal as well as possible support of anterior vagina, possibly with a sacrospinous ligament plication.  She does not want an abdominal incision if at all possible.    Aware she will need to see someone else, in addition to me, for complete surgical planning.  She requests to see Dr. Matilde Sprang for this.  appt will be needed and she understands this.  Pt would like to keep ovaries if she decides to proceed with surgery.  Pros and cons of this were reviewed.  Bilateral salpingectomy was reviewed with decreased risks of ovarian cancer.  TVH vs LAVH reviewed with surgery required by Dr. Matilde Sprang for prolapse repair.    Briefly reviewed surgery, risks and benefits but as complete plan is not in plan, this was not comprehensive.  Highly recommend urology appt first with subsequent surgery planning after.  Also, hospital stay, pain management, recovery time all reviewed.  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    Pt does not want any synthetic mesh used.  Reviewed with pt these are not used for prolapse repairs but are often used if SUI is present and repair needed.  Pt will need to consider this as well.  Of note, pt has had bilateral hip replacements but does have good flexion on both sides.  Pt had a page of questions that were each individually addressed.  Husband participated with questions as well.  Review of Systems  All other systems reviewed and are negative.      Objective:   Physical Exam  Constitutional: She is oriented to person, place, and time. She appears well-developed.  Abdominal: Soft. Bowel sounds are normal.  Genitourinary: Uterus normal. There is no rash, tenderness or lesion on the right labia. There is no rash, tenderness or lesion on the left labia. Cervix exhibits no motion tenderness, no discharge and no friability. Right adnexum displays no mass, no tenderness and no fullness. Left adnexum displays no mass, no tenderness and no fullness.    Third degree cystocele with second degree uterine prolapse noted in supine position.  Lymphadenopathy:       Right: No inguinal adenopathy present.       Left: No inguinal adenopathy present.  Neurological: She is alert and oriented to person, place, and time.  Skin: Skin is warm and dry.  Psychiatric: She has  a normal mood and affect.       Assessment:     Incomplete uterine prolapse Cystocele Pessary use with persistent (and biopsied showing granulation tissue) vaginal ulceration just beneath cervix  H/o bilateral hip replacements    Plan:     Plan referral to Dr. Matilde Sprang.  Pt aware will need urodynamics and then surgery planning. Pt would like to proceed at the end of January if possible, depending on consultation.  As I am not sure, yet, what will be needed, I am not sure this is possible.  For my part, I would plan LAVH/Bilateral salpingectomy/possible  BSO/possible cystoscopy, depending in additional recommendations.  ~50 minutes spent with patient >50% of time was in face to face discussion of above.

## 2014-12-26 ENCOUNTER — Telehealth: Payer: Self-pay | Admitting: *Deleted

## 2014-12-26 NOTE — Telephone Encounter (Signed)
Reviewed with Dr Sabra Heck. Per patient request appointment scheduled with Dr Matilde Sprang at [facility] for February 02, 2015 at 0800. Patient will receive new patient packet in the mail with forms and instructions to complete. (appointment scheduled by Maudie Mercury.) Call also placed to [facility], surgery scheduler, regarding potential surgery date. Left message to call back. Patient notified of new patient appointment with Dr Matilde Sprang. Will provide updates regarding potential surgery dates.      Patient would like to know if she needs previously scheduled recheck appointment with Dr Sabra Heck scheduled for 01-16-15 or wait until surgery is scheduled?

## 2014-12-29 NOTE — Telephone Encounter (Signed)
Spoke with patient. Patient states she is calling to speak with [name]. "She helped me get an appointment scheduled with another doctor so I can be evaluated before surgery. She was in contact with someone to see if I could be seen sooner. I am just uncomfortable. I just wanted to see if she heard anything." Patient states she would like to have her surgery as soon as possible. Advised I will send a message over to Lamont Snowball, RN for review and return call to discuss. Patient is agreeable.

## 2014-12-29 NOTE — Telephone Encounter (Signed)
Patient has questions regarding her upcoming surgery.

## 2014-12-29 NOTE — Telephone Encounter (Signed)
Return call to patient. Advised tentative date for surgery is 03-13-15, first available date for both Sabra Heck and [employer].  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 Advised this may not be suitable activity for postopeartive recovery. May need Dr MacDiarmid's input on restrictions even if surgery in February, depending on size of dogs and work involved. Patient will attempt alternative arrangements and call me back next week.  Routing to provider for final review. Patient agreeable to disposition. Will close encounter.

## 2014-12-29 NOTE — Telephone Encounter (Signed)
Ok to cancel appt and wait until we have more planning completed.

## 2015-01-03 ENCOUNTER — Telehealth: Payer: Self-pay | Admitting: Obstetrics & Gynecology

## 2015-01-03 NOTE — Telephone Encounter (Signed)
Patient calling in regards to scheduling dates for surgery. Patient is wanting to know if she can have March 14th, 2017. Patient is aware that [name] is not in the office and we are unsure of when she will return.

## 2015-01-04 NOTE — Telephone Encounter (Signed)
Patient calling again does not want to lose the slot for 03/20/15 for surgery. I advised patient that Tara Fernandez is not in today and that she is still sick. Patient agreeable and aware does not want to lose that slot.

## 2015-01-05 NOTE — Telephone Encounter (Signed)
Asked by reception to pick up call from patient who is asking to schedule surgery.  When introducing myself, patient asked if I as the office manager. I advised that I was not, I am a triage nurse and that I help with surgical scheduling as needed.  Patient states she gave her availability for surgery and stated that she is not available 3/4-3/12. She states she was given the date of 03/13/15 and she is not available on that day. I advised that surgery is not scheduled at this time for her on that day, simply that this was the first available date that both Dr. Matilde Sprang and Dr. Sabra Heck are available. Patient expresses that she feels that she feels that her surgery will be pushed out further because she has not scheduled. I advised Tara Fernandez that her surgery would not be pushed back because they are scheduled when the patient is available, however, if 03/13/15 will not work for her we will need to work with coordinating Dr. Sanjuan Dame and Dr. Mikle Bosworth schedule.  Patient asks if me if it is documented that she is requesting the 14th of March, I advised that it is, however, I cannot know if Dr. Miller/MacDiarmid will be able to do the surgery on the 14th. Patient asked me if she needs to call Dr. Mikle Bosworth office or email Dr. Sabra Heck. I advised that she can, however, we work with Dr. Virgina Evener office directly for surgical scheduling.  Patient is aware that [name] coordinates surgeries and that she has been out of the office. I advised her that I will send her concerns to both Dr. Sabra Heck and [name] and that she will be contacted when [name] is available.

## 2015-01-09 ENCOUNTER — Telehealth: Payer: Self-pay | Admitting: Obstetrics & Gynecology

## 2015-01-09 NOTE — Telephone Encounter (Signed)
Pt has decided to proceed with urologist evaluation with Dr. Matilde Sprang.  Exact surgery has not been determined yet.  She has appt with him at the end of the month.  Also, has area in posterior vagina that must heal before surgery can be completely.  Due to work and prior commitments, pt desirous of being scheduled so she can plan around a certain time.  Date of 03/05/15 is now set and she is aware of this.  Pt has follow up with me next week.  Will be given additional instructions at that time.  She is aware exact surgery cannot be planned until she sees Dr. Matilde Sprang.  He and I will then communicate and surgical planning will be done at that time.  Pt appreciative of work that has been done to accommodate her needs.  Routing to [employer]

## 2015-01-09 NOTE — Telephone Encounter (Signed)
Per patient request, have contacted Dr MacDiarmid's office. Alternative date of 03-05-15 is available and reserved for patient. See next phone encounter. Encounter closed.  Routing to provider for final review.

## 2015-01-16 ENCOUNTER — Ambulatory Visit (INDEPENDENT_AMBULATORY_CARE_PROVIDER_SITE_OTHER): Payer: BLUE CROSS/BLUE SHIELD | Admitting: Obstetrics & Gynecology

## 2015-01-16 ENCOUNTER — Encounter: Payer: Self-pay | Admitting: Obstetrics & Gynecology

## 2015-01-16 VITALS — BP 102/60 | HR 76 | Resp 16 | Wt 123.0 lb

## 2015-01-16 DIAGNOSIS — IMO0002 Reserved for concepts with insufficient information to code with codable children: Secondary | ICD-10-CM

## 2015-01-16 DIAGNOSIS — Z9289 Personal history of other medical treatment: Secondary | ICD-10-CM

## 2015-01-16 DIAGNOSIS — N814 Uterovaginal prolapse, unspecified: Secondary | ICD-10-CM

## 2015-01-16 DIAGNOSIS — Z960 Presence of urogenital implants: Secondary | ICD-10-CM

## 2015-01-16 DIAGNOSIS — N8110 Cystocele, unspecified: Secondary | ICD-10-CM

## 2015-01-16 NOTE — Progress Notes (Signed)
Subjective:     Patient ID: Tara Fernandez, female   DOB: [birth year], 65 y.o.   MRN: [id number]  HPI 65 yo G2P2 MWF with cystocele and incomplete uterine prolapse here for recheck of area of granulation tissue in the vagina.  Pt has left pessary out but has much more issues with voiding as a result of this.  She denies vaginal discharge.  Denies pain.  Has appt with Dr. Matilde Sprang at the end of the month.  Will have to have urodynamics and then surgery will be decided upon.  She is tentatively scheduled but exact surgery has not been determined as she has not seen Dr. Matilde Sprang yet.  She has a list of post-surgical questions that she wants to ask.  I answered these to the best of my ability as I do not yet know exactly what her surgery will be.  I suspect vaginal support procedure with anterior repair and posterior repair will be done as well as recommendation regarding hysterectomy.  If this were the procedure, I answered her questions making this predisposition.  She is aware that she will need a hospital pre-op consultation as well but this will not be scheduled until about two weeks before surgery.  She was worried that she had not been called yet.    Review of Systems  All other systems reviewed and are negative.      Objective:   Physical Exam  Constitutional: She is oriented to person, place, and time. She appears well-developed and well-nourished.  Abdominal: Soft. Bowel sounds are normal.  Genitourinary: Uterus normal. There is no rash or lesion on the right labia. There is no rash, tenderness or lesion on the left labia. Cervix exhibits no motion tenderness. Right adnexum displays no mass, no tenderness and no fullness. Left adnexum displays no mass, no tenderness and no fullness.    Lymphadenopathy:       Right: No inguinal adenopathy present.       Left: No inguinal adenopathy present.  Neurological: She is alert and oriented to person, place, and time.  Skin: Skin is warm and dry.    Psychiatric: She has a normal mood and affect.       Assessment:     Incomplete uterine prolapse Second degree uterine prolapse Posterior vaginal granulation tissue     Plan:     Pt will see Dr. Matilde Sprang and then will proceed with planning surgery.  Will need to discuss with him the posterior granulation tissue and it's presence in determining what needs to be done before surgery, if anything.  Pt understands this and voices understanding.  Will use her pessary only when working until she has consultation.

## 2015-01-25 ENCOUNTER — Telehealth: Payer: Self-pay | Admitting: Obstetrics & Gynecology

## 2015-01-25 NOTE — Telephone Encounter (Signed)
Call back to patient. Advised that surgery is scheduled for 03-05-15 at [facility]. Dr [name] part is already scheduled and Dr MacDiarmid's part is pending the appointment. Advised Dr MacDiarmid's staff likely cannot see the surgery since it is not officially posted until she is seen by their office. Assured patient that I have confirmed with Dr MacDiarmid's surgery scheduler, Jeannene Patella, and patient is on their schedule for surgery on 03-05-15. Patient states appointment with dr Matilde Sprang was rescheduled to 02-05-15.  Routing to provider for final review.  Will close encounter.

## 2015-01-25 NOTE — Telephone Encounter (Signed)
Patient called and said, "I am concerned about my surgery date with Dr. Matilde Sprang and Dr. Sabra Heck on 03/05/15. I have made personal arrangements for that date but will nobody at Dr. Mikle Bosworth office seems aware of this specific date. I need to know what's going on please?"

## 2015-01-27 IMAGING — CR DG PORTABLE PELVIS
1 series · 1 of 1 positions shown · non-contrast
Comparison: Lateral view today.

CLINICAL DATA: Postoperative radiograph.  Left anterior hip
arthroplasty.

PORTABLE PELVIS

[AP]
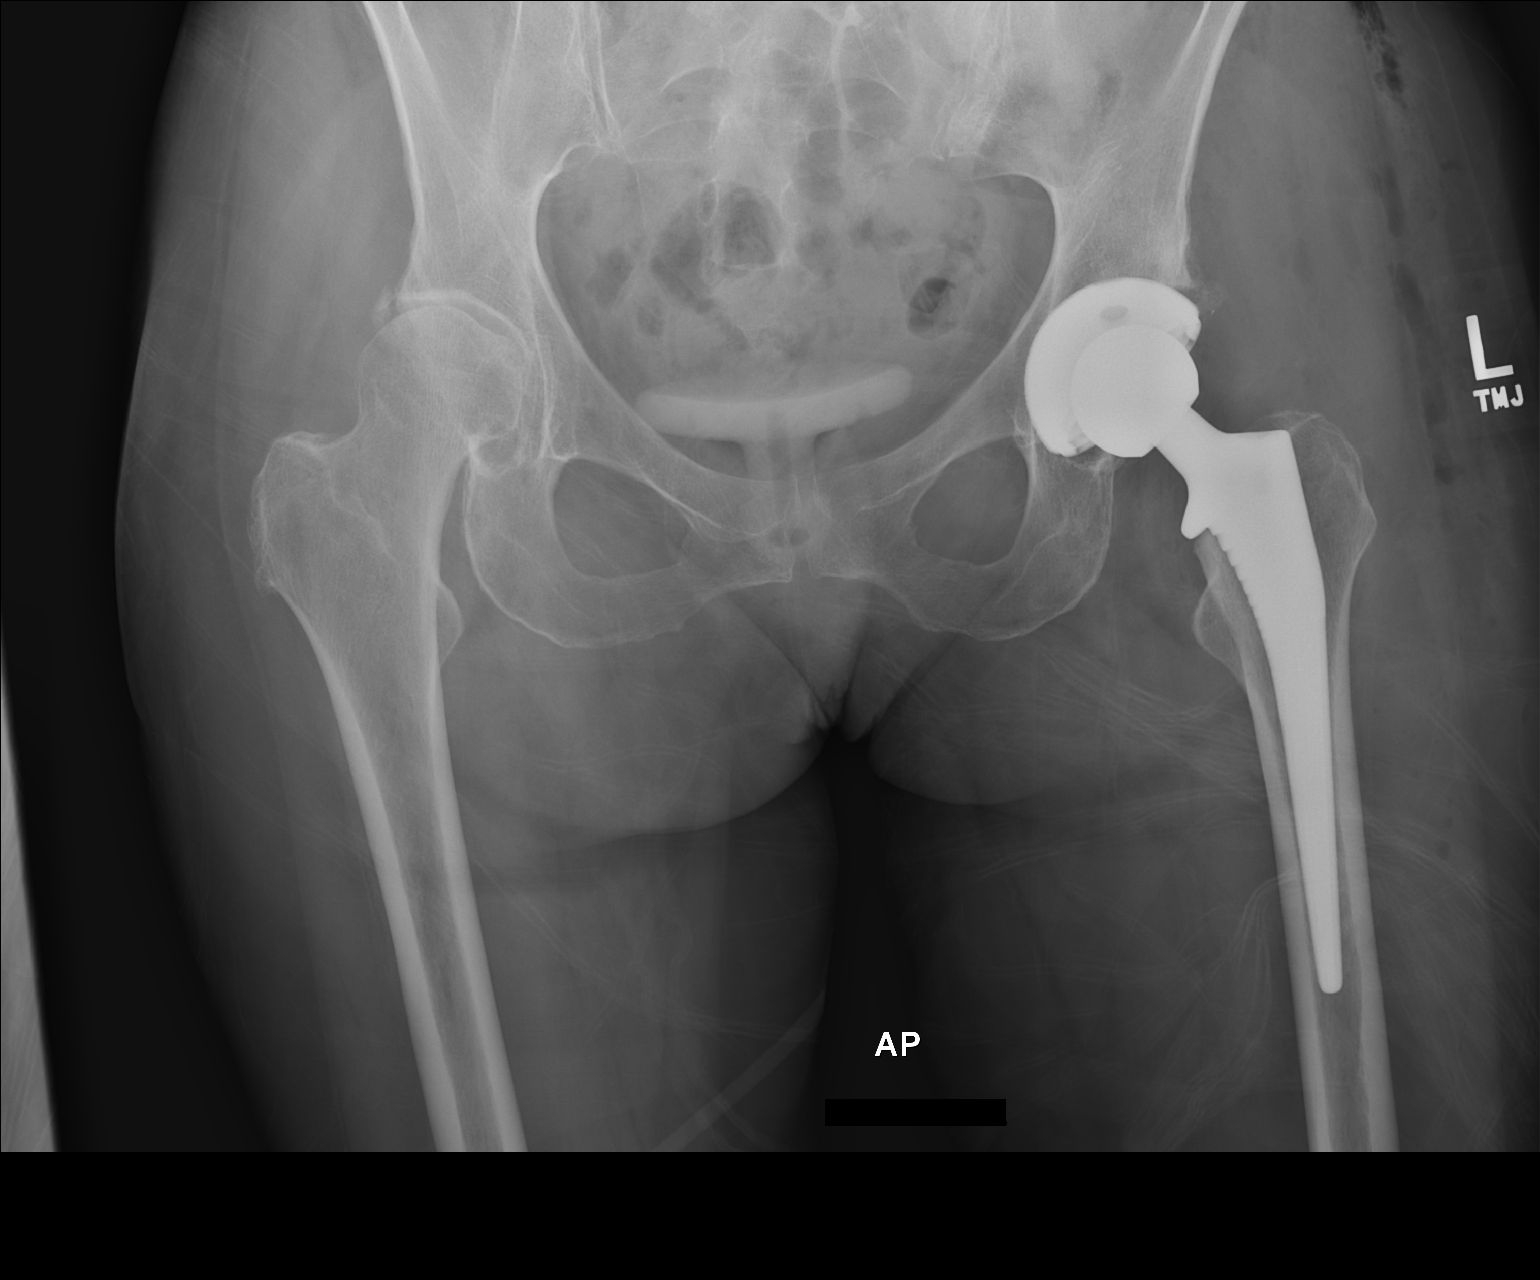

[1 of 1 positions shown; findings below may reference images not displayed]

FINDINGS: New left total hip arthroplasty is present.  Expected
postoperative changes in the soft tissues.  Hip appears located.
Moderate to severe right hip osteoarthritis is present with os
acetabulum nearby.  Appears to be a pessary within the vagina.
IMPRESSION: Uncomplicated new left total hip arthroplasty.

## 2015-01-30 ENCOUNTER — Telehealth: Payer: Self-pay | Admitting: Obstetrics & Gynecology

## 2015-01-30 NOTE — Telephone Encounter (Signed)
Spoke with pt regarding benefit for surgery. Patient understood and agreeable. Patient is scheduled for 03/05/15. Patient provided surgery payment in full over the phone. Patient aware this is professional benefit only. Patient aware will be contacted by hospital for separate benefits. Ok to close

## 2015-02-06 ENCOUNTER — Telehealth: Payer: Self-pay | Admitting: Obstetrics & Gynecology

## 2015-02-06 NOTE — Telephone Encounter (Signed)
Patient says she need to schedule an appointment with Dr Sabra Heck before her surgery.

## 2015-02-07 NOTE — Telephone Encounter (Signed)
Spoke with patient. Patient states she needs to schedule her pre-op appointment with Dr.Miller. She will be having surgery on 03/05/2015 for a laparoscopic assisted vaginal hysterectomy. Pre-operative appointment scheduled for 02/23/2015 at 3 pm with Dr.Miller. She is agreeable to date and time. She would also like to know she has seen Dr.MacDiarmid and he will be performing her bladder surgery. She will be having some additional testing done with Dr.MacDiarmid in the next week. Advised I will let Dr.Miller know. She is agreeable.  Routing to provider for final review. Patient agreeable to disposition. Will close encounter.

## 2015-02-12 ENCOUNTER — Telehealth: Payer: Self-pay | Admitting: *Deleted

## 2015-02-12 NOTE — Telephone Encounter (Signed)
Patient returned call. States she is scheduled for urodynamic testing with Dr Matilde Sprang on 02-21-15. Surgery instruction sheet reviewed and printed copy mailed, see copy scanned to chart.  Has surgery consult with Dr Sabra Heck on 02-23-15 already scheduled. Scheduled post op appointments for 1 week and 4 weeks post op and will adjust as needed.  Pam from Dr MacDiarmid's office also calls back to confirm surgery date. Will provide update after urodynamic appointment.  Routing to provider for final review. Will close encounter.

## 2015-02-12 NOTE — Telephone Encounter (Signed)
Call to patient to review surgery instructions and schedule surgery pre and post operative appointments. Left message to call back on home and cell numbers. Ask for sally or Olivia Mackie.

## 2015-02-12 NOTE — Telephone Encounter (Signed)
Call to pam at Dr MacDiarmid's office to confirm surgery information and OR time needed. Left message to call back.

## 2015-02-20 ENCOUNTER — Telehealth: Payer: Self-pay | Admitting: Obstetrics & Gynecology

## 2015-02-20 NOTE — Telephone Encounter (Signed)
Left patient a message to call back to reschedule a future appointment that was cancelled by the provider for DOS 03/12/15.

## 2015-02-21 ENCOUNTER — Other Ambulatory Visit: Payer: Self-pay | Admitting: Obstetrics & Gynecology

## 2015-02-21 ENCOUNTER — Other Ambulatory Visit: Payer: Self-pay | Admitting: Urology

## 2015-02-22 ENCOUNTER — Telehealth: Payer: Self-pay | Admitting: Obstetrics & Gynecology

## 2015-02-22 NOTE — Telephone Encounter (Signed)
Patient had a reaction to cipro yesterday she broke out, wanted [place] to be aware.

## 2015-02-22 NOTE — Telephone Encounter (Signed)
Left message to call Kaitlyn at [phone]. 

## 2015-02-22 NOTE — Telephone Encounter (Signed)
Spoke with patient. Patient states that she was seen with Dr.MacDiarmid yesterday for urodynamics testing. She was prescribed Cipro 500 mg to take once after testing. Reports she took Cipro 500 mg last night and had "welps" on her face and a red circular rash down her arm that was itchy. Denies any SOB. She has reported this to Dr.MacDiarmid's office and was advised to take Benadryl. Reports all symptoms have gone away today. Advised I will update her allergies to include Cipro. She is agreeable.  Routing to provider for final review. Patient agreeable to disposition. Will close encounter.

## 2015-02-23 ENCOUNTER — Encounter: Payer: Self-pay | Admitting: Obstetrics & Gynecology

## 2015-02-23 ENCOUNTER — Ambulatory Visit (INDEPENDENT_AMBULATORY_CARE_PROVIDER_SITE_OTHER): Payer: BLUE CROSS/BLUE SHIELD | Admitting: Obstetrics & Gynecology

## 2015-02-23 ENCOUNTER — Encounter (HOSPITAL_COMMUNITY): Payer: Self-pay

## 2015-02-23 ENCOUNTER — Encounter (HOSPITAL_COMMUNITY)
Admission: RE | Admit: 2015-02-23 | Discharge: 2015-02-23 | Disposition: A | Discharge status: Home / Self Care | Payer: BLUE CROSS/BLUE SHIELD | Source: Ambulatory Visit | Attending: Obstetrics & Gynecology | Admitting: Obstetrics & Gynecology

## 2015-02-23 VITALS — BP 110/80 | HR 66 | Resp 18 | Ht 63.0 in | Wt 123.0 lb

## 2015-02-23 DIAGNOSIS — R350 Frequency of micturition: Secondary | ICD-10-CM

## 2015-02-23 DIAGNOSIS — Z124 Encounter for screening for malignant neoplasm of cervix: Secondary | ICD-10-CM | POA: Diagnosis not present

## 2015-02-23 DIAGNOSIS — Z01812 Encounter for preprocedural laboratory examination: Secondary | ICD-10-CM | POA: Diagnosis present

## 2015-02-23 DIAGNOSIS — N8110 Cystocele, unspecified: Secondary | ICD-10-CM | POA: Diagnosis not present

## 2015-02-23 DIAGNOSIS — N814 Uterovaginal prolapse, unspecified: Secondary | ICD-10-CM | POA: Diagnosis not present

## 2015-02-23 DIAGNOSIS — N898 Other specified noninflammatory disorders of vagina: Secondary | ICD-10-CM | POA: Diagnosis not present

## 2015-02-23 DIAGNOSIS — Z01810 Encounter for preprocedural cardiovascular examination: Secondary | ICD-10-CM | POA: Insufficient documentation

## 2015-02-23 DIAGNOSIS — IMO0002 Reserved for concepts with insufficient information to code with codable children: Secondary | ICD-10-CM

## 2015-02-23 LAB — ABO/RH: ABO/RH(D): B POS

## 2015-02-23 LAB — CBC
HCT: 40.4 % (ref 36.0–46.0)
Hemoglobin: 14 g/dL (ref 12.0–15.0)
MCH: 31.2 pg (ref 26.0–34.0)
MCHC: 34.7 g/dL (ref 30.0–36.0)
MCV: 90 fL (ref 78.0–100.0)
PLATELETS: 187 10*3/uL (ref 150–400)
RBC: 4.49 MIL/uL (ref 3.87–5.11)
RDW: 12.7 % (ref 11.5–15.5)
WBC: 4.5 10*3/uL (ref 4.0–10.5)

## 2015-02-23 LAB — TYPE AND SCREEN
ABO/RH(D): B POS
ANTIBODY SCREEN: NEGATIVE

## 2015-02-23 LAB — PROTIME-INR
INR: 0.91 (ref 0.00–1.49)
Prothrombin Time: 12.5 seconds (ref 11.6–15.2)

## 2015-02-23 LAB — APTT: aPTT: 29 seconds (ref 24–37)

## 2015-02-23 MED ORDER — IBUPROFEN 800 MG PO TABS: 800.0000 mg | ORAL_TABLET | Freq: Three times a day (TID) | ORAL | Status: DC | PRN

## 2015-02-23 MED ORDER — HYDROCODONE-ACETAMINOPHEN 5-325 MG PO TABS: 1.0000 | ORAL_TABLET | Freq: Four times a day (QID) | ORAL | Status: DC | PRN

## 2015-02-23 NOTE — Patient Instructions (Signed)
Your procedure is scheduled on: March 05, 2015   Enter through the Main Entrance of [facility] at: 6:00 am   Pick up the phone at the desk and dial [phone].  Call this number if you have problems the morning of surgery: [phone].  Remember: Do NOT eat food: after midnight on Sunday the 26th Do NOT drink clear liquids after: midnight on Sunday the 26th  Take these medicines the morning of surgery with a SIP OF WATER: none   Do NOT wear jewelry (body piercing), metal hair clips/bobby pins, make-up, or nail polish. Do NOT wear lotions, powders, or perfumes.  You may wear deoderant. Do NOT shave for 48 hours prior to surgery. Do NOT bring valuables to the hospital. Contacts, dentures, or bridgework may not be worn into surgery. Leave suitcase in car.  After surgery it may be brought to your room.  For patients admitted to the hospital, checkout time is 11:00 AM the day of discharge.

## 2015-02-23 NOTE — Progress Notes (Signed)
65 y.o. G29P2002 Married Caucasian female here for discussion of upcoming procedure on 03/05/15.  Pt has seen Dr. McDiarmid and he is planning cystocele repair as well as sacrospinous ligament suspension.  Pt is also going to have a laparoscopic assisted vaginal hysterectomy with salpingectomy.  Pt really does prefer to leave ovaries.  Future risks of ovarian cancer have been discussed as well as minimal cardiovascular benefit after the age of 50.  She voices clear understanding of this but desire to keep ovaries.    Procedure discussed with patient.  Hospital stay, recovery and pain management all discussed.  Risks discussed including but not limited to bleeding, 1% risk of receiving a  transfusion, infection, 3-4% risk of bowel/bladder/ureteral/vascular injury discussed as well as possible need for additional surgery if injury does occur discussed.  DVT/PE and rare risk of death discussed.  My actual complications with prior surgeries discussed.  Vaginal cuff dehiscence discussed.  Hernia formation discussed.  Positioning and incision locations discussed.  Patient aware if pathology abnormal she may need additional treatment.  All questions answered.    Ob Hx:   Patient's last menstrual period was 01/07/1996 (approximate).          Sexually active: Yes.   Birth control: Post Menopausal  Last pap: 10/15/12 Neg. HR HPV:neg.  Pap obtained today. Last MMG: 12/15/12 BIRADS2:Benign  Tobacco: No  Past Surgical History  Procedure Laterality Date  . Breast mass excision      Hx: of left breast  . Total hip arthroplasty Left 06/17/2012    Procedure: LEFT TOTAL HIP ARTHROPLASTY ANTERIOR APPROACH;  Surgeon: Mcarthur Rossetti, MD;  Location: [facility];  Service: Orthopedics;  Laterality: Left;  . Cartilage surgery  10 th grade    RIGHT KNEE  . Total hip arthroplasty Right 02/11/2013    Procedure: RIGHT TOTAL HIP ARTHROPLASTY ANTERIOR APPROACH;  Surgeon: Mcarthur Rossetti, MD;  Location: [facility];  Service:  Orthopedics;  Laterality: Right;  . Breast surgery      Past Medical History  Diagnosis Date  . Arthritis     osteoarthritis; PAIN RIGHT HIP  . Vaginal pessary present     PT HAS SMALL BLOOD BLISTER  - UP INSIDE VAGINA - PT'S GYN DOCTOR- DR LATHROP- AWARE -TOLD TO USE PREMARIN VAGINAL CREAM.  PT DOES NOT HAVE VAGINAL BLEEDING    Allergies: Tramadol; Chlorhexidine; Other; Premarin; Ciprofloxacin; and Latex  No current outpatient prescriptions on file.   No current facility-administered medications for this visit.    ROS: A comprehensive review of systems was negative.  Exam:    Ht 5\' 3"  (1.6 m)  LMP 01/07/1996 (Approximate)  General appearance: alert and cooperative Head: Normocephalic, without obvious abnormality, atraumatic Neck: no adenopathy, supple, symmetrical, trachea midline and thyroid not enlarged, symmetric, no tenderness/mass/nodules Lungs: clear to auscultation bilaterally Heart: regular rate and rhythm, S1, S2 normal, no murmur, click, rub or gallop Abdomen: soft, non-tender; bowel sounds normal; no masses,  no organomegaly Extremities: extremities normal, atraumatic, no cyanosis or edema Skin: Skin color, texture, turgor normal. No rashes or lesions Lymph nodes: Cervical, supraclavicular, and axillary nodes normal. no inguinal nodes palpated Neurologic: Grossly normal  Pelvic: External genitalia:  no lesions              Urethra: normal appearing urethra with no masses, tenderness or lesions              Bartholins and Skenes: normal  Vagina: normal appearing vagina with normal color and discharge, no lesions, 4th degree cystocele              Cervix: normal appearance              Pap taken: Yes.          Bimanual Exam:  Uterus:  uterus is normal size, shape, consistency and nontender                                      Adnexa:    not indicated and normal adnexa in size, nontender and no masses                                       Rectovaginal: Deferred                                      Anus:  normal sphincter tone, no lesions  A: Incomplete uterine prolapse with 4th degree cystocele     P:  LAVH/bilatersal salpingectomy, possible BSO planned Rx for Motrin and Percocet given. Medications/Vitamins reviewed.  Hysterectomy brochure given for pre and post op instructions.  ~25 minutes spent with patient >50% of time was in face to face discussion of above.  Will call to get copy of MMG from [facility]

## 2015-02-28 LAB — IPS PAP TEST WITH REFLEX TO HPV

## 2015-03-02 NOTE — H&P (Signed)
History of Present Illness   I was consulted by Dr Sabra Heck regarding Tara Fernandez's prolapse. She has had a pessary for 25 years. She was having vaginal ulcers and they were removed. She has had a Gellhorn pessary. She was initially followed by Dr Quincy Simmonds but now wishes to be treated by Dr Sabra Heck and myself. Dr Sabra Heck has planned a laparoscopic-assisted vaginal hysterectomy and, I believe, removing her ovaries. The patient does not want mesh or an abdominal procedure.   At baseline, she is continent and voids every hour and cannot sit through a 2-hour movie. She gets up twice at night. She could hold it longer when she had her pessary and was dry with and without the pessary.     When I saw Tara Califf recently, she has a grade 3 cystocele that exited the introitus. The cervix descended to 4 cm. She had a negative cough test and a grade 1 rectocele. She had a little bit of deficiency of the posterior fourchette. I thought if she ever had surgery, she would likely best benefit from a hysterectomy with a vault suspension, cystocele repair, and graft. I did not think she needed a rectocele repair but would need to be consented for one.   She is here to discuss her urodynamics.   Frequency: Stable.   On urodynamics, she did not void and was catheterized for 200 mL. Initial residual was 163 mL. Her maximum capacity was 338 mL. The bladder was stable. She had some sensory urgency. She did not leak with a Valsalva pressure of 116 cmH2O with or without the prolapse reduced. During voluntary voiding, it took a long time for her to actually void. She finally voided 220 mL with a maximum flow of 14 mL/s. The maximum voiding pressure was up to 45 cmH2O. The residual was 120 mL. EMG activity was increased during the voiding phase. Fluoroscopically she had hypermobility and a large prolapse, and hip hardware was noted. She did not void with a vaginal pack in place. Details of urodynamics are signed and dictated on the  urodynamic sheet.    Past Medical History Problems  1. No pertinent past medical history  Surgical History Problems  1. History of Hip Surgery  Current Meds 1. Advil TABS;  Therapy: (Recorded:30Jan2017) to Recorded 2. Daily Multiple Vitamins Oral Tablet;  Therapy: (Recorded:15Feb2017) to Recorded 3. DHEA 25 MG Oral Tablet;  Therapy: (Recorded:15Feb2017) to Recorded 4. Echinacea TABS;  Therapy: (Recorded:15Feb2017) to Recorded 5. Elderberry-Maltodextrin POWD;  Therapy: (Recorded:15Feb2017) to Recorded 6. Evening Primrose Oil CAPS;  Therapy: (Recorded:15Feb2017) to Recorded 7. Fish Oil CAPS;  Therapy: (Recorded:15Feb2017) to Recorded 8. Immune System Booster PACK;  Therapy: (Recorded:15Feb2017) to Recorded 9. Lysine TABS;  Therapy: (Recorded:15Feb2017) to Recorded 10. Magnesium Glycinate Plus CAPS;   Therapy: (Recorded:15Feb2017) to Recorded 11. SAM-e TABS;   Therapy: (Recorded:15Feb2017) to Recorded 12. Vitamin C 1000 MG CHEW;   Therapy: (Recorded:15Feb2017) to Recorded 13. Vitamin D3 CAPS;   Therapy: (Recorded:15Feb2017) to Recorded  Family History Problems  1. No pertinent family history : Mother  Social History Problems  1. Alcohol use (Z78.9) 2. Caffeine use (F15.90) 3. Father deceased 41. Has 2 children 5. Married 6. Never a smoker 7. Occupation  Assessment Assessed  1. Rectocele, female (N81.6) 2. Cystocele, midline (N81.11)  Discussion/Summary   Watchful waiting was discussed. She has failed a pessary. I discussed a transvaginal vault suspension, a cystocele repair and graft, and possible rectocele repair.   I drew her a picture and we talked about  prolapse surgery in detail. Pros, cons, general surgical and anesthetic risks, and other options including behavioral therapy, pessaries, and watchful waiting were discussed. She understands that prolapse repairs are successful in 80-85% of cases for prolapse symptoms and can recur anteriorly,  posteriorly, and/or apically. She understands that in most cases I use a graft and general risks were discussed. Surgical risks were described but not limited to the discussion of injury to neighboring structures including the bowel (with possible life-threatening sepsis and colostomy), bladder, urethra, vagina (all resulting in further surgery), and ureter (resulting in re-implantation). We talked about injury to nerves/soft tissue leading to debilitating and intractable pelvic, abdominal, and lower extremity pain syndromes and neuropathies. The risks of buttock pain, intractable dyspareunia, and vaginal narrowing and shortening with sequelae were discussed. Bleeding risks, transfusion rates, and infection were discussed. The risk of persistent, de novo, or worsening bladder and/or bowel incontinence/dysfunction was discussed. The need for CIC was described as well the usual post-operative course. The patient understands that she might not reach her treatment goal and that she might be worse following surgery.  De novo incontinence and mesh issues described.   Tara Winchell would like to proceed with surgery. She is sexually active. She has a latex allergy. She says that she also has an issue with latex. She thinks there is a list of medications she may not tolerate well in Epic and we will check. She is scheduled for surgery.   After a thorough review of the management options for the patient's condition the patient  elected to proceed with surgical therapy as noted above. We have discussed the potential benefits and risks of the procedure, side effects of the proposed treatment, the likelihood of the patient achieving the goals of the procedure, and any potential problems that might occur during the procedure or recuperation. Informed consent has been obtained.

## 2015-03-04 ENCOUNTER — Encounter (HOSPITAL_COMMUNITY): Payer: Self-pay | Admitting: Anesthesiology

## 2015-03-04 MED ORDER — DEXTROSE 5 % IV SOLN
2.0000 g | INTRAVENOUS | Status: AC
  Administered 2015-03-05: 2 g via INTRAVENOUS
  Filled 2015-03-04: qty 2

## 2015-03-05 ENCOUNTER — Ambulatory Visit (HOSPITAL_COMMUNITY): Payer: BLUE CROSS/BLUE SHIELD | Admitting: Anesthesiology

## 2015-03-05 ENCOUNTER — Encounter (HOSPITAL_COMMUNITY)
Admission: RE | Disposition: A | Discharge status: Home / Self Care | Payer: Self-pay | Source: Ambulatory Visit | Attending: Obstetrics & Gynecology

## 2015-03-05 ENCOUNTER — Encounter (HOSPITAL_COMMUNITY): Payer: Self-pay | Admitting: Emergency Medicine

## 2015-03-05 ENCOUNTER — Ambulatory Visit (HOSPITAL_COMMUNITY)
Admission: RE | Admit: 2015-03-05 | Discharge: 2015-03-06 | Disposition: A | Discharge status: Home / Self Care | Payer: BLUE CROSS/BLUE SHIELD | Source: Ambulatory Visit | Attending: Obstetrics & Gynecology | Admitting: Obstetrics & Gynecology

## 2015-03-05 DIAGNOSIS — IMO0002 Reserved for concepts with insufficient information to code with codable children: Secondary | ICD-10-CM | POA: Diagnosis present

## 2015-03-05 DIAGNOSIS — N816 Rectocele: Secondary | ICD-10-CM | POA: Insufficient documentation

## 2015-03-05 DIAGNOSIS — N814 Uterovaginal prolapse, unspecified: Secondary | ICD-10-CM | POA: Diagnosis not present

## 2015-03-05 DIAGNOSIS — Z888 Allergy status to other drugs, medicaments and biological substances status: Secondary | ICD-10-CM | POA: Diagnosis not present

## 2015-03-05 DIAGNOSIS — N812 Incomplete uterovaginal prolapse: Secondary | ICD-10-CM | POA: Insufficient documentation

## 2015-03-05 DIAGNOSIS — Z91040 Latex allergy status: Secondary | ICD-10-CM | POA: Insufficient documentation

## 2015-03-05 DIAGNOSIS — Z96643 Presence of artificial hip joint, bilateral: Secondary | ICD-10-CM | POA: Diagnosis not present

## 2015-03-05 DIAGNOSIS — M1611 Unilateral primary osteoarthritis, right hip: Secondary | ICD-10-CM | POA: Insufficient documentation

## 2015-03-05 DIAGNOSIS — N83311 Acquired atrophy of right ovary: Secondary | ICD-10-CM | POA: Insufficient documentation

## 2015-03-05 DIAGNOSIS — N83312 Acquired atrophy of left ovary: Secondary | ICD-10-CM | POA: Diagnosis not present

## 2015-03-05 DIAGNOSIS — Z885 Allergy status to narcotic agent status: Secondary | ICD-10-CM | POA: Diagnosis not present

## 2015-03-05 DIAGNOSIS — Z881 Allergy status to other antibiotic agents status: Secondary | ICD-10-CM | POA: Diagnosis not present

## 2015-03-05 DIAGNOSIS — N8110 Cystocele, unspecified: Secondary | ICD-10-CM | POA: Diagnosis not present

## 2015-03-05 HISTORY — PX: CYSTOSCOPY: SHX5120

## 2015-03-05 HISTORY — DX: Other specified postprocedural states: Z98.890

## 2015-03-05 HISTORY — DX: Nausea with vomiting, unspecified: R11.2

## 2015-03-05 HISTORY — PX: LAPAROSCOPIC VAGINAL HYSTERECTOMY WITH SALPINGECTOMY: SHX6680

## 2015-03-05 HISTORY — PX: ANTERIOR AND POSTERIOR REPAIR: SHX5121

## 2015-03-05 SURGERY — HYSTERECTOMY, VAGINAL, LAPAROSCOPY-ASSISTED, WITH SALPINGECTOMY
Anesthesia: General | Site: Vagina

## 2015-03-05 MED ORDER — DEXAMETHASONE SODIUM PHOSPHATE 10 MG/ML IJ SOLN
INTRAMUSCULAR | Status: AC
  Filled 2015-03-05: qty 1

## 2015-03-05 MED ORDER — PROMETHAZINE HCL 25 MG/ML IJ SOLN: 6.2500 mg | INTRAMUSCULAR | Status: DC | PRN

## 2015-03-05 MED ORDER — ROCURONIUM BROMIDE 100 MG/10ML IV SOLN
INTRAVENOUS | Status: DC | PRN
  Administered 2015-03-05: 20 mg via INTRAVENOUS
  Administered 2015-03-05 (×2): 10 mg via INTRAVENOUS
  Administered 2015-03-05: 40 mg via INTRAVENOUS
  Administered 2015-03-05 (×6): 10 mg via INTRAVENOUS

## 2015-03-05 MED ORDER — DEXAMETHASONE SODIUM PHOSPHATE 4 MG/ML IJ SOLN
INTRAMUSCULAR | Status: DC | PRN
  Administered 2015-03-05 (×2): 4 mg via INTRAVENOUS

## 2015-03-05 MED ORDER — MORPHINE SULFATE (PF) 4 MG/ML IV SOLN
1.0000 mg | INTRAVENOUS | Status: DC | PRN
  Administered 2015-03-05: 1 mg via INTRAVENOUS
  Filled 2015-03-05: qty 1

## 2015-03-05 MED ORDER — KETOROLAC TROMETHAMINE 30 MG/ML IJ SOLN
30.0000 mg | Freq: Four times a day (QID) | INTRAMUSCULAR | Status: DC
  Administered 2015-03-05 – 2015-03-06 (×4): 30 mg via INTRAVENOUS
  Filled 2015-03-05 (×3): qty 1

## 2015-03-05 MED ORDER — ONDANSETRON HCL 4 MG/2ML IJ SOLN
INTRAMUSCULAR | Status: DC | PRN
Start: 2015-03-05 — End: 2015-03-05
  Administered 2015-03-05: 4 mg via INTRAVENOUS

## 2015-03-05 MED ORDER — PHENYLEPHRINE HCL 10 MG/ML IJ SOLN
INTRAMUSCULAR | Status: DC | PRN
  Administered 2015-03-05 (×5): 40 ug via INTRAVENOUS
  Administered 2015-03-05 (×2): 80 ug via INTRAVENOUS
  Administered 2015-03-05 (×2): 40 ug via INTRAVENOUS
  Administered 2015-03-05: 80 ug via INTRAVENOUS
  Administered 2015-03-05: 40 ug via INTRAVENOUS

## 2015-03-05 MED ORDER — MIDAZOLAM HCL 2 MG/2ML IJ SOLN: 0.5000 mg | Freq: Once | INTRAMUSCULAR | Status: DC | PRN

## 2015-03-05 MED ORDER — ONDANSETRON HCL 4 MG/2ML IJ SOLN
INTRAMUSCULAR | Status: AC
  Filled 2015-03-05: qty 2

## 2015-03-05 MED ORDER — FENTANYL CITRATE (PF) 100 MCG/2ML IJ SOLN
INTRAMUSCULAR | Status: AC
Start: 2015-03-05 — End: 2015-03-05
  Filled 2015-03-05: qty 2

## 2015-03-05 MED ORDER — LIDOCAINE-EPINEPHRINE 1 %-1:100000 IJ SOLN
INTRAMUSCULAR | Status: AC
  Filled 2015-03-05: qty 1

## 2015-03-05 MED ORDER — HYDROMORPHONE HCL 1 MG/ML IJ SOLN
INTRAMUSCULAR | Status: AC
  Filled 2015-03-05: qty 1

## 2015-03-05 MED ORDER — LIDOCAINE HCL (CARDIAC) 20 MG/ML IV SOLN
INTRAVENOUS | Status: DC | PRN
  Administered 2015-03-05: 30 mg via INTRAVENOUS

## 2015-03-05 MED ORDER — FENTANYL CITRATE (PF) 250 MCG/5ML IJ SOLN
INTRAMUSCULAR | Status: DC | PRN
  Administered 2015-03-05 (×2): 50 ug via INTRAVENOUS
  Administered 2015-03-05: 150 ug via INTRAVENOUS

## 2015-03-05 MED ORDER — NEOSTIGMINE METHYLSULFATE 10 MG/10ML IV SOLN
INTRAVENOUS | Status: DC | PRN
  Administered 2015-03-05: 3 mg via INTRAVENOUS

## 2015-03-05 MED ORDER — ACETAMINOPHEN 325 MG PO TABS: 650.0000 mg | ORAL_TABLET | ORAL | Status: DC | PRN

## 2015-03-05 MED ORDER — MIDAZOLAM HCL 2 MG/2ML IJ SOLN
INTRAMUSCULAR | Status: DC | PRN
  Administered 2015-03-05: 1 mg via INTRAVENOUS

## 2015-03-05 MED ORDER — GLYCOPYRROLATE 0.2 MG/ML IJ SOLN
INTRAMUSCULAR | Status: AC
  Filled 2015-03-05: qty 4

## 2015-03-05 MED ORDER — ESTRADIOL 0.1 MG/GM VA CREA
TOPICAL_CREAM | VAGINAL | Status: AC
  Filled 2015-03-05: qty 42.5

## 2015-03-05 MED ORDER — FLUORESCEIN SODIUM 10 % IJ SOLN
500.0000 mg | Freq: Once | INTRAMUSCULAR | Status: AC
  Administered 2015-03-05: 500 mg via INTRAVENOUS
  Filled 2015-03-05: qty 5

## 2015-03-05 MED ORDER — NEOSTIGMINE METHYLSULFATE 10 MG/10ML IV SOLN
INTRAVENOUS | Status: AC
  Filled 2015-03-05: qty 1

## 2015-03-05 MED ORDER — LACTATED RINGERS IR SOLN
Status: DC | PRN
  Administered 2015-03-05: 3000 mL

## 2015-03-05 MED ORDER — METOCLOPRAMIDE HCL 5 MG/ML IJ SOLN
INTRAMUSCULAR | Status: AC
  Filled 2015-03-05: qty 2

## 2015-03-05 MED ORDER — BUPIVACAINE HCL (PF) 0.25 % IJ SOLN
INTRAMUSCULAR | Status: DC | PRN
  Administered 2015-03-05: 3 mL

## 2015-03-05 MED ORDER — ROPIVACAINE HCL 5 MG/ML IJ SOLN
INTRAMUSCULAR | Status: AC
  Filled 2015-03-05: qty 30

## 2015-03-05 MED ORDER — MEPERIDINE HCL 25 MG/ML IJ SOLN: 6.2500 mg | INTRAMUSCULAR | Status: DC | PRN

## 2015-03-05 MED ORDER — KETOROLAC TROMETHAMINE 30 MG/ML IJ SOLN
INTRAMUSCULAR | Status: AC
  Filled 2015-03-05: qty 1

## 2015-03-05 MED ORDER — STERILE WATER FOR IRRIGATION IR SOLN
Status: DC | PRN
  Administered 2015-03-05 (×2): 1000 mL via INTRAVESICAL

## 2015-03-05 MED ORDER — DEXAMETHASONE SODIUM PHOSPHATE 4 MG/ML IJ SOLN
INTRAMUSCULAR | Status: AC
  Filled 2015-03-05: qty 1

## 2015-03-05 MED ORDER — FENTANYL CITRATE (PF) 250 MCG/5ML IJ SOLN
INTRAMUSCULAR | Status: AC
  Filled 2015-03-05: qty 5

## 2015-03-05 MED ORDER — ROCURONIUM BROMIDE 100 MG/10ML IV SOLN
INTRAVENOUS | Status: AC
  Filled 2015-03-05: qty 1

## 2015-03-05 MED ORDER — METHYLENE BLUE 1 % INJ SOLN
INTRAMUSCULAR | Status: AC
  Filled 2015-03-05: qty 1

## 2015-03-05 MED ORDER — SODIUM CHLORIDE 0.9 % IJ SOLN
INTRAMUSCULAR | Status: AC
  Filled 2015-03-05: qty 10

## 2015-03-05 MED ORDER — OXYCODONE-ACETAMINOPHEN 5-325 MG PO TABS
1.0000 | ORAL_TABLET | ORAL | Status: DC | PRN
  Administered 2015-03-06: 1 via ORAL
  Filled 2015-03-05: qty 1

## 2015-03-05 MED ORDER — PHENYLEPHRINE 40 MCG/ML (10ML) SYRINGE FOR IV PUSH (FOR BLOOD PRESSURE SUPPORT)
PREFILLED_SYRINGE | INTRAVENOUS | Status: AC
  Filled 2015-03-05: qty 20

## 2015-03-05 MED ORDER — HYDROMORPHONE HCL 1 MG/ML IJ SOLN
INTRAMUSCULAR | Status: DC | PRN
  Administered 2015-03-05: 1 mg via INTRAVENOUS

## 2015-03-05 MED ORDER — HYDROMORPHONE HCL 1 MG/ML IJ SOLN
0.2500 mg | INTRAMUSCULAR | Status: DC | PRN
  Administered 2015-03-05: 0.5 mg via INTRAVENOUS

## 2015-03-05 MED ORDER — SIMETHICONE 80 MG PO CHEW: 80.0000 mg | CHEWABLE_TABLET | Freq: Four times a day (QID) | ORAL | Status: DC | PRN

## 2015-03-05 MED ORDER — DIPHENHYDRAMINE HCL 50 MG/ML IJ SOLN: 25.0000 mg | Freq: Four times a day (QID) | INTRAMUSCULAR | Status: DC | PRN

## 2015-03-05 MED ORDER — GENTAMICIN SULFATE 40 MG/ML IJ SOLN
5.0000 mg/kg | INTRAVENOUS | Status: AC
  Administered 2015-03-05: 280 mg via INTRAVENOUS
  Filled 2015-03-05: qty 7

## 2015-03-05 MED ORDER — DIPHENHYDRAMINE HCL 50 MG/ML IJ SOLN
12.5000 mg | Freq: Four times a day (QID) | INTRAMUSCULAR | Status: DC | PRN
  Administered 2015-03-05: 12.5 mg via INTRAVENOUS
  Filled 2015-03-05: qty 1

## 2015-03-05 MED ORDER — METOCLOPRAMIDE HCL 5 MG/ML IJ SOLN
INTRAMUSCULAR | Status: DC | PRN
  Administered 2015-03-05: 5.5 mg via INTRAVENOUS

## 2015-03-05 MED ORDER — LIDOCAINE-EPINEPHRINE 1 %-1:100000 IJ SOLN
INTRAMUSCULAR | Status: DC | PRN
  Administered 2015-03-05: 10 mL
  Administered 2015-03-05: 19 mL

## 2015-03-05 MED ORDER — PROPOFOL 10 MG/ML IV BOLUS
INTRAVENOUS | Status: DC | PRN
  Administered 2015-03-05: 200 mg via INTRAVENOUS

## 2015-03-05 MED ORDER — ALUM & MAG HYDROXIDE-SIMETH 200-200-20 MG/5ML PO SUSP: 30.0000 mL | ORAL | Status: DC | PRN

## 2015-03-05 MED ORDER — MENTHOL 3 MG MT LOZG
1.0000 | LOZENGE | OROMUCOSAL | Status: DC | PRN
  Administered 2015-03-05: 3 mg via ORAL
  Filled 2015-03-05: qty 9

## 2015-03-05 MED ORDER — LACTATED RINGERS IV SOLN
INTRAVENOUS | Status: DC
  Administered 2015-03-05 (×4): via INTRAVENOUS

## 2015-03-05 MED ORDER — SCOPOLAMINE 1 MG/3DAYS TD PT72
1.0000 | MEDICATED_PATCH | TRANSDERMAL | Status: DC
Start: 2015-03-05 — End: 2015-03-05
  Administered 2015-03-05: 1.5 mg via TRANSDERMAL

## 2015-03-05 MED ORDER — KETOROLAC TROMETHAMINE 30 MG/ML IJ SOLN: 30.0000 mg | Freq: Four times a day (QID) | INTRAMUSCULAR | Status: DC

## 2015-03-05 MED ORDER — SCOPOLAMINE 1 MG/3DAYS TD PT72
MEDICATED_PATCH | TRANSDERMAL | Status: AC
  Administered 2015-03-05: 1.5 mg via TRANSDERMAL
  Filled 2015-03-05: qty 1

## 2015-03-05 MED ORDER — MIDAZOLAM HCL 2 MG/2ML IJ SOLN
INTRAMUSCULAR | Status: AC
Start: 2015-03-05 — End: 2015-03-05
  Filled 2015-03-05: qty 2

## 2015-03-05 MED ORDER — BUPIVACAINE HCL (PF) 0.25 % IJ SOLN
INTRAMUSCULAR | Status: AC
  Filled 2015-03-05: qty 30

## 2015-03-05 MED ORDER — HYDROMORPHONE HCL 1 MG/ML IJ SOLN
INTRAMUSCULAR | Status: AC
  Administered 2015-03-05: 0.5 mg via INTRAVENOUS
  Filled 2015-03-05: qty 1

## 2015-03-05 MED ORDER — SODIUM CHLORIDE 0.9 % IR SOLN
Freq: Once | Status: AC
  Administered 2015-03-05: 500 mL
  Filled 2015-03-05: qty 1

## 2015-03-05 MED ORDER — PHENYLEPHRINE 40 MCG/ML (10ML) SYRINGE FOR IV PUSH (FOR BLOOD PRESSURE SUPPORT)
PREFILLED_SYRINGE | INTRAVENOUS | Status: AC
  Filled 2015-03-05: qty 10

## 2015-03-05 MED ORDER — LIDOCAINE HCL (CARDIAC) 20 MG/ML IV SOLN
INTRAVENOUS | Status: AC
  Filled 2015-03-05: qty 5

## 2015-03-05 MED ORDER — PROPOFOL 10 MG/ML IV BOLUS
INTRAVENOUS | Status: AC
  Filled 2015-03-05: qty 20

## 2015-03-05 MED ORDER — DEXTROSE-NACL 5-0.45 % IV SOLN
INTRAVENOUS | Status: DC
  Administered 2015-03-05 (×2): via INTRAVENOUS

## 2015-03-05 MED ORDER — PANTOPRAZOLE SODIUM 40 MG IV SOLR
40.0000 mg | Freq: Every day | INTRAVENOUS | Status: DC
  Administered 2015-03-05: 40 mg via INTRAVENOUS
  Filled 2015-03-05 (×2): qty 40

## 2015-03-05 MED ORDER — GLYCOPYRROLATE 0.2 MG/ML IJ SOLN
INTRAMUSCULAR | Status: DC | PRN
  Administered 2015-03-05: 0.4 mg via INTRAVENOUS
  Administered 2015-03-05: 0.1 mg via INTRAVENOUS

## 2015-03-05 SURGICAL SUPPLY — 92 items
ALLOGRAFT TUTOPLAST AXIS 6X12 (Tissue) ×3 IMPLANT
BAG URINE DRAINAGE (UROLOGICAL SUPPLIES) ×4 IMPLANT
BLADE SURG 15 STRL LF C SS BP (BLADE) ×3 IMPLANT
BLADE SURG 15 STRL SS (BLADE) ×1
CABLE HIGH FREQUENCY MONO STRZ (ELECTRODE) ×4 IMPLANT
CANISTER SUCT 3000ML (MISCELLANEOUS) ×4 IMPLANT
CATH FOLEY 2WAY SLVR  5CC 16FR (CATHETERS)
CATH FOLEY 2WAY SLVR 5CC 16FR (CATHETERS) IMPLANT
CATH FOLEY LATEX FREE 14FR (CATHETERS) ×1
CATH FOLEY LF 14FR (CATHETERS) ×3 IMPLANT
CATH ROBINSON RED A/P 16FR (CATHETERS) IMPLANT
CLOTH BEACON ORANGE TIMEOUT ST (SAFETY) ×4 IMPLANT
CONT PATH 16OZ SNAP LID 3702 (MISCELLANEOUS) ×4 IMPLANT
COVER BACK TABLE 60X90IN (DRAPES) ×4 IMPLANT
DECANTER SPIKE VIAL GLASS SM (MISCELLANEOUS) ×8 IMPLANT
DEVICE CAPIO SLIM SINGLE (INSTRUMENTS) ×4 IMPLANT
DRAIN PENROSE 1/4X12 LTX (DRAIN) IMPLANT
DRAPE UNDERBUTTOCKS STRL (DRAPE) IMPLANT
DRSG COVADERM PLUS 2X2 (GAUZE/BANDAGES/DRESSINGS) IMPLANT
DRSG OPSITE POSTOP 3X4 (GAUZE/BANDAGES/DRESSINGS) IMPLANT
DURAPREP 26ML APPLICATOR (WOUND CARE) ×4 IMPLANT
ELECT REM PT RETURN 9FT ADLT (ELECTROSURGICAL) ×4
ELECTRODE REM PT RTRN 9FT ADLT (ELECTROSURGICAL) ×3 IMPLANT
EVACUATOR SMOKE 8.L (FILTER) IMPLANT
FORCEPS CUTTING 33CM 5MM (CUTTING FORCEPS) IMPLANT
GAUZE PACKING 2X5 YD STRL (GAUZE/BANDAGES/DRESSINGS) IMPLANT
GAUZE PACKING IODOFORM 2 (PACKING) ×4 IMPLANT
GAUZE SPONGE 4X4 16PLY XRAY LF (GAUZE/BANDAGES/DRESSINGS) ×4 IMPLANT
GLOVE BIO SURGEON STRL SZ7.5 (GLOVE) IMPLANT
GLOVE BIO SURGEON STRL SZ8 (GLOVE) ×8 IMPLANT
GLOVE BIOGEL PI IND STRL 7.0 (GLOVE) ×27 IMPLANT
GLOVE BIOGEL PI INDICATOR 7.0 (GLOVE) ×9
GLOVE ECLIPSE 6.5 STRL STRAW (GLOVE) ×8 IMPLANT
GLOVE SS BIOGEL STRL SZ 6.5 (GLOVE) ×18 IMPLANT
GLOVE SUPERSENSE BIOGEL SZ 6.5 (GLOVE) ×6
GOWN STRL REUS W/TWL LRG LVL3 (GOWN DISPOSABLE) ×16 IMPLANT
LEGGING LITHOTOMY PAIR STRL (DRAPES) ×4 IMPLANT
LIGASURE BLUNT 5MM 37CM (INSTRUMENTS) ×4 IMPLANT
LIQUID BAND (GAUZE/BANDAGES/DRESSINGS) ×8 IMPLANT
NEEDLE INSUFFLATION 120MM (ENDOMECHANICALS) ×4 IMPLANT
NEEDLE MAYO 6 CRC TAPER PT (NEEDLE) ×4 IMPLANT
NS IRRIG 1000ML POUR BTL (IV SOLUTION) ×8 IMPLANT
OCCLUDER COLPOPNEUMO (BALLOONS) IMPLANT
PACK LAVH (CUSTOM PROCEDURE TRAY) ×4 IMPLANT
PACK ROBOTIC GOWN (GOWN DISPOSABLE) ×4 IMPLANT
PACK VAGINAL WOMENS (CUSTOM PROCEDURE TRAY) IMPLANT
PAD TRENDELENBURG POSITION (MISCELLANEOUS) ×4 IMPLANT
PENCIL BUTTON HOLSTER BLD 10FT (ELECTRODE) IMPLANT
PLUG CATH AND CAP STER (CATHETERS) ×8 IMPLANT
RETRACTOR STAY HOOK 5MM (MISCELLANEOUS) ×4 IMPLANT
SCISSORS LAP 5X35 DISP (ENDOMECHANICALS) IMPLANT
SEALER TISSUE G2 CVD JAW 35 (ENDOMECHANICALS) IMPLANT
SEALER TISSUE G2 CVD JAW 45CM (ENDOMECHANICALS)
SET CYSTO W/LG BORE CLAMP LF (SET/KITS/TRAYS/PACK) ×4 IMPLANT
SET IRRIG TUBING LAPAROSCOPIC (IRRIGATION / IRRIGATOR) ×4 IMPLANT
SHEET LAVH (DRAPES) IMPLANT
SLEEVE SURGEON STRL (DRAPES) ×4 IMPLANT
SLEEVE XCEL OPT CAN 5 100 (ENDOMECHANICALS) ×8 IMPLANT
SOLUTION ELECTROLUBE (MISCELLANEOUS) IMPLANT
SUT CAPIO ETHIBPND (SUTURE) ×8 IMPLANT
SUT SILK 2 0 FS (SUTURE) IMPLANT
SUT VIC AB 0 CT1 18XCR BRD8 (SUTURE) ×6 IMPLANT
SUT VIC AB 0 CT1 27 (SUTURE)
SUT VIC AB 0 CT1 27XBRD ANBCTR (SUTURE) IMPLANT
SUT VIC AB 0 CT1 36 (SUTURE) ×8 IMPLANT
SUT VIC AB 0 CT1 8-18 (SUTURE) ×2
SUT VIC AB 2-0 CT1 (SUTURE) ×8 IMPLANT
SUT VIC AB 2-0 SH 27 (SUTURE) ×5
SUT VIC AB 2-0 SH 27XBRD (SUTURE) ×15 IMPLANT
SUT VIC AB 3-0 PS2 18 (SUTURE) ×1
SUT VIC AB 3-0 PS2 18XBRD (SUTURE) ×3 IMPLANT
SUT VIC AB 3-0 SH 27 (SUTURE) ×1
SUT VIC AB 3-0 SH 27X BRD (SUTURE) ×3 IMPLANT
SUT VICRYL 0 TIES 12 18 (SUTURE) ×4 IMPLANT
SUT VICRYL 0 UR6 27IN ABS (SUTURE) ×4 IMPLANT
SYR 50ML LL SCALE MARK (SYRINGE) ×4 IMPLANT
SYR BULB IRRIGATION 50ML (SYRINGE) IMPLANT
SYRINGE 10CC LL (SYRINGE) IMPLANT
TAPE UMBILICAL 1/8 X36 TWILL (MISCELLANEOUS) ×4 IMPLANT
TIP UTERINE 5.1X6CM LAV DISP (MISCELLANEOUS) IMPLANT
TIP UTERINE 6.7X10CM GRN DISP (MISCELLANEOUS) IMPLANT
TIP UTERINE 6.7X6CM WHT DISP (MISCELLANEOUS) IMPLANT
TIP UTERINE 6.7X8CM BLUE DISP (MISCELLANEOUS) IMPLANT
TOWEL OR 17X24 6PK STRL BLUE (TOWEL DISPOSABLE) ×16 IMPLANT
TRAY FOLEY CATH SILVER 14FR (SET/KITS/TRAYS/PACK) IMPLANT
TROCAR BALLN 12MMX100 BLUNT (TROCAR) IMPLANT
TROCAR XCEL NON-BLD 11X100MML (ENDOMECHANICALS) IMPLANT
TROCAR XCEL NON-BLD 5MMX100MML (ENDOMECHANICALS) ×4 IMPLANT
TUBING NON-CON 1/4 X 20 CONN (TUBING) IMPLANT
TUTOPLAST AXIS 6X12 (Tissue) ×4 IMPLANT
WARMER LAPAROSCOPE (MISCELLANEOUS) ×4 IMPLANT
WATER STERILE IRR 1000ML POUR (IV SOLUTION) ×8 IMPLANT

## 2015-03-05 NOTE — Anesthesia Procedure Notes (Signed)
Procedure Name: Intubation Date/Time: 03/05/2015 7:40 AM Performed by: Flossie Dibble Pre-anesthesia Checklist: Patient being monitored, Suction available, Emergency Drugs available, Patient identified and Timeout performed Patient Re-evaluated:Patient Re-evaluated prior to inductionOxygen Delivery Method: Circle system utilized Preoxygenation: Pre-oxygenation with 100% oxygen Intubation Type: IV induction Ventilation: Oral airway inserted - appropriate to patient size and Mask ventilation without difficulty Laryngoscope Size: Mac and 3 Grade View: Grade I Tube type: Oral Tube size: 7.0 mm Number of attempts: 1 Airway Equipment and Method: Stylet Placement Confirmation: ETT inserted through vocal cords under direct vision,  positive ETCO2 and breath sounds checked- equal and bilateral Secured at: 20 cm Tube secured with: Tape Dental Injury: Teeth and Oropharynx as per pre-operative assessment

## 2015-03-05 NOTE — Progress Notes (Signed)
Day of Surgery Procedure(s) (LRB): LAPAROSCOPIC ASSISTED VAGINAL HYSTERECTOMY WITH SALPINGECTOMY (Bilateral) CYSTOSCOPY (N/A) ANTERIOR (CYSTOCELE) REPAIR, VAULT PROLAPSE AND GRAFT (N/A)  Subjective: Patient reports no nausea.  She's ready to eat.  Had broth without issues.  Reports flatus x 1.  Surgery reviewed.  Questions answered.  Pt has walked as well.  Had itching with morphine.  Benadryl given.  Objective: I have reviewed patient's vital signs, intake and output, medications and labs. Tmax: 98.8 R: 16 R: 58-70 BP: 92-123/49-75  General: alert and cooperative Resp: clear to auscultation bilaterally Cardio: regular rate and rhythm, S1, S2 normal, no murmur, click, rub or gallop GI: soft, non-tender; bowel sounds normal; no masses,  no organomegaly and incision: clean, dry and intact Extremities: extremities normal, atraumatic, no cyanosis or edema Vaginal Bleeding: minimal  Assessment: s/p Procedure(s): LAPAROSCOPIC ASSISTED VAGINAL HYSTERECTOMY WITH SALPINGECTOMY (Bilateral) CYSTOSCOPY (N/A) ANTERIOR (CYSTOCELE) REPAIR, VAULT PROLAPSE AND GRAFT (N/A): stable and progressing well  Plan: Advance diet Advance to PO medication  CBC and BMP in am Continue ambulation and SCDs Will do bladder trials tomorrow after packing and foley are removed     Tara Fernandez Tara Fernandez 03/05/2015, 6:04 PM

## 2015-03-05 NOTE — Anesthesia Postprocedure Evaluation (Signed)
Anesthesia Post Note  Patient: Tara Fernandez  Procedure(s) Performed: Procedure(s) (LRB): LAPAROSCOPIC ASSISTED VAGINAL HYSTERECTOMY WITH SALPINGECTOMY (Bilateral) CYSTOSCOPY (N/A) ANTERIOR (CYSTOCELE) REPAIR, VAULT PROLAPSE AND GRAFT (N/A)  Patient location during evaluation: PACU Anesthesia Type: General Level of consciousness: awake and alert, oriented and patient cooperative Pain management: pain level controlled Vital Signs Assessment: post-procedure vital signs reviewed and stable Respiratory status: spontaneous breathing, nonlabored ventilation, respiratory function stable and patient connected to nasal cannula oxygen Cardiovascular status: blood pressure returned to baseline and stable Postop Assessment: no signs of nausea or vomiting Anesthetic complications: no    Last Vitals:  Filed Vitals:   03/05/15 1215 03/05/15 1230  BP: 115/73 110/60  Pulse: 66 66  Temp:    Resp: 16 16    Last Pain:  Filed Vitals:   03/05/15 1247  PainSc: 3                  Lindzy Rupert,E. Aero Drummonds

## 2015-03-05 NOTE — Op Note (Signed)
Preoperative diagnosis: Cystocele and vault prolapse and small rectocele Postoperative diagnoses: Cystocele and vault prolapse and small rectocele Surgery: Cystocele repair and graft and vault suspension and cystoscopy Surgeon: Dr. Nicki Reaper Saori Umholtz Assistant: Dr. Nyoka Cowden  The patient has the above diagnoses and consented the above procedure. The patient had been placed in stirrups by Dr. Sabra Heck. She had a hysterectomy and removal of her ovaries. The posterior cuff was run. The ureteral sacral ligaments were tagged.  I initially cystoscoped the patient and the bladder was normal with no injury. There was no yellow dye at this stage so I asked for fluorocein iv  I placed the vaginal wall speculum and she did have very elastic mobile tissue. I instilled 20 mL of a lidocaine epinephrine mixture anteriorly. I use my Allis technique to make a long anterior vaginal wall incision and sharply and bluntly mobilize the overlying vaginal wall mucosa from the underlying pubocervical fascia to the white line bilaterally. I mobilized appropriately at the apex though the posterior running suture did infringe upon this some especially on her left side. She had a significant trapdoor deformity with minimal central defect. I did not want detach the apex totally.  Anatomically in keeping appropriate length I did a 2 layer anterior repair not imbricating the bladder neck. 2-0 Vicryl suture was utilized.  Retractor was taken down and I cystoscoped the patient. Cystoscopically she had a good repair. Ureters were not distorted. There was excellent yellow jets bilaterally  Staying at the apex and along the appropriate plane I finger dissected to the ischial spine bilaterally mobilizing tissue medially. I was very happy with the mobilization. Her sacrospinous ligament was somewhat flimsy and mobile.  With the bladder emptied and utilizing a capio device I placed a 0 Ethibond in a straight line between the spines 1 full finger  breath medial to the spine. I triple checked the position. I did a rectal examination and there is no rectal injury and there is appropriate position of the 2 sutures  A 12 x 6 dermal graft well-prepared was cut to 10 x 6 and then to the shape of a trapezoid. Utilizing a UR 6 needle and 0 Vicryl I placed the suture at the urethrovesical angle bilaterally. The graft was sewn in place tension-free supporting the cystocele repair  I trimmed an appropriate amount of redundant anterior vaginal wall tissue and closed the anterior vaginal wall with running 2-0 Vicryl on a CT1 needle. The graft laid very nicely underneath the bladder.  I closed the apex from left to right and right to left starting at each apex. I reapproximated the thin ureteral sacral ligaments in the midline utilizing one of the tagged suture. The cuff was closed appropriately.  Vaginal length was excellent. There was very good support anteriorly. 2 fingers could be inserted throughout the entire length of vagina. There was no rectocele. She does have some deficiency of the posterior fourchette making the retractor a little bit more challenging initially. Again the patient's tissues were very mobile and elastic even along the sidewalls. There is no question she did not have any central defect to imbricate posteriorly  Hopefully the operation will reach the patient's treatment goal

## 2015-03-05 NOTE — Anesthesia Preprocedure Evaluation (Addendum)
Anesthesia Evaluation  Patient identified by MRN, date of birth, ID band Patient awake    History of Anesthesia Complications (+) PONV and history of anesthetic complications  Airway Mallampati: I  TM Distance: >3 FB Neck ROM: Full    Dental  (+) Dental Advisory Given   Pulmonary neg pulmonary ROS,    breath sounds clear to auscultation       Cardiovascular negative cardio ROS   Rhythm:Regular Rate:Normal     Neuro/Psych negative neurological ROS     GI/Hepatic negative GI ROS, Neg liver ROS,   Endo/Other  negative endocrine ROS  Renal/GU negative Renal ROS     Musculoskeletal   Abdominal   Peds  Hematology negative hematology ROS (+)   Anesthesia Other Findings   Reproductive/Obstetrics                            Anesthesia Physical Anesthesia Plan  ASA: I  Anesthesia Plan: General   Post-op Pain Management:    Induction: Intravenous  Airway Management Planned: Oral ETT  Additional Equipment:   Intra-op Plan:   Post-operative Plan: Extubation in OR  Informed Consent: I have reviewed the patients History and Physical, chart, labs and discussed the procedure including the risks, benefits and alternatives for the proposed anesthesia with the patient or authorized representative who has indicated his/her understanding and acceptance.   Dental advisory given  Plan Discussed with: CRNA and Surgeon  Anesthesia Plan Comments: (Plan routine monitors, GETA)        Anesthesia Quick Evaluation

## 2015-03-05 NOTE — Op Note (Signed)
03/05/2015  10:06 AM  PATIENT:  Tara Fernandez  65 y.o. female  PRE-OPERATIVE DIAGNOSIS:  incomplete uterine prolapse, cystocele  POST-OPERATIVE DIAGNOSIS:  incomplete uterine prolapse, cystocele  PROCEDURE:  Procedure(s): LAPAROSCOPIC ASSISTED VAGINAL HYSTERECTOMY WITH SALPINGECTOMY CYSTOSCOPY (Dr. Matilde Sprang) ANTERIOR (CYSTOCELE) AND POSTERIOR REPAIR (RECTOCELE), VAULT PROLAPSE AND GRAFT (Dr. Matilde Sprang)  SURGEON:  [place], Satira Anis  ASSISTANTS: Sumner Boast, MD  ANESTHESIA:   general  ESTIMATED BLOOD LOSS: 15cc  BLOOD ADMINISTERED:none   FLUIDS: 1500cc LR  UOP: 100cc clear UOP  SPECIMEN:  Uterus, cervix, bilateral fallopian tubes  DISPOSITION OF SPECIMEN:  PATHOLOGY  FINDINGS: normal pelvis, small uterus, atrophic ovaries, normal upper abdomen  DESCRIPTION OF OPERATION:  Patient is taken to the operating room. She is placed in the supine position. She is a running IV in place. Informed consent was present on the chart. SCDs on her lower extremities and functioning properly. General endotracheal anesthesia was administered by the anesthesia staff without difficulty. Dr. Glennon Mac oversaw case. Once adequate anesthesia was confirmed the legs are placed in the low lithotomy position in [place]. Her arms were positioned by the side with arm positioners.   Derma prep was then used to prep the abdomen and Betadine was used to prep the inner thighs, perineum and vagina. Once 3 minutes had past the patient was draped in a normal standard fashion.  Time out performed.  The legs were lifted to the high lithotomy position. The cervix was visualized by placing a heavy weighted speculum in the posterior aspect of the vagina and using a curved Deaver retractor to the retract anteriorly. The anterior lip of the cervix was grasped with single-tooth tenaculum. A Hulka clamp was advanced through the cervical canal and attached to the anterior lip of the cervix as a means of manipulating the  uterus during the procedure. The tenaculum was removed. The speculum was removed. A Foley catheter was placed to straight drain. Clear urine was noted. Legs were lowered to the low lithotomy position and attention was turned the abdomen.   Umbilicus was elevated above the abdomen.  0.25% Marcaine was used anesthetize the skin in the umbilicus. Skin incision made with #11 blade.  A Veress needle was obtained.  This was passed through the elevated umbilicus until fluid started to drip in to the pelvis.  CO2 gas attached and pneumoperitoneum was achieved with low flow of CO2 gas.  Once 2.6 liters of gas was in the abdomen the Veress needle was removed and a 53mm non bladed trochar and port with optiview tip was passed directly to the abdomen through the umbilical incision. The laparoscope was then used to confirm intraperitoneal placement. Upper and lower abdomen was surveyed.  Photodocumentation made.  Locations for LLQ and RLQ 45mm ports were chosen with transillumination of the skin. The skin was anesthetized with 0.25% Marcaine. 72mm skin incisions were made and 53mm nonbladed trocars and ports were passed directly into the abdomen. The trochars were removed.  The patient was placed in Trendelenburg.  The ovaries were atrophic. Ureters were noted bilaterally.   Attention was turned to the left side. With uterus on stretch the left fallopian tube was excised off the ovary, above the IP ligament with the ligasure device.  Utero-ovarian pedicle was clamped, cauterized, and incised.  Next the left round ligament was serially clamped cauterized and incised. The anterior and posterior peritoneum of the inferior leaf of the broad ligament were opened. The posterior leaf was taken down to the uterosacral ligament and the  anterior peritoneum was opened across the midline.   Attention was turned the left side and in a similar fashion, the right fallopian tube was excised off the ovary, above the IP ligament with the  ligasure device.  Utero-ovarian pedicle was clamped, cauterized, and incised.  Next the right round ligament was serially clamped, cauterized, and incised. The anterior and posterior peritoneum of the inferior leaf of the broad ligament were opened and the posterior peritoneum was taken down to the level of the left uterosacral ligament. The anterior peritoneum was opened and connected to the dissection done on the left. The bladder was taken down the cervix with sharp dissection.  Monopolar cautery was used for excellent hemostasis when needed.  The uterine artery was skeletonized on each side.  Then the right uterine artery was clamped, cauterized and incised.  In a similar fashion, the left uterine artery was clamped, cauterized, and incised.  Pelvis was irrigated.  No bleeding was noted.  Pneumopertineum was relieved and, again, excellent hemostasis was present.  Ports removed.  Skin cleansed and incisions were closed with Dermabone.  At this point, decision was made to proceed vaginally.  Attention was turned below. Legs were elevated to the high lithotomy position.  (Pt had been positioned awake due to bilateral hip replacements so legs were not flexed at the hips any further then the pt was comfortable when awake.)   A heavy weighted speculum was placed in the posterior vagina. Cervix was injected with 1% Lidocaine mixed 1:1 with Epinephrine (1:100,000U). The posterior cul de sac was entered sharply and a suture was placed across the posterior vaginal mucosa and peritoneum.  Bonano speculum was placed in this incision.  Then Heany clamp was used to clamp across uterosacral ligaments on each side. #0 Vicryl Heaney stitches were placed and the stitches were left long. Then using a knife an incision was made from the 9:00 position on the cervix across to the 3:00 position. This was kept on the cervix because of the size of the cystocele.  This incision was continued with Mayo scissors by cutting a right angle  to the incision and straight down on the cervix. Then, using scissors, the dissection was continue parpallel with the cervix.  An open Ray-Tec was used to advance in the pubovesicocervical plane.  This dissection then entered the anterior cul de sac.  Curved Deaver retractor was placed in the anterior incision.  Then the cardinal ligaments were serially clamped cauterized and incised.  With two clamps on each side, the vaginal dissection med the laparoscopic dissection.  At this point, the specimen was delivered. The cervix uterus and bilateral tubes were delivered together and handed off to be sent to pathology. Pedicles were inspected this point. Small amount of bleeding was noted just above the uterosacral ligament on the left side.  A figure of eight suture of #2.0 Vicryl was used to obtained excellent hemostasis.  The cuff was run from the uterosacral ligament on the left, using a running interlocking suture of #2-0 Vicryl, to the uterosacral ligament on the right.  At this point, a moistened lap tap was placed as there was a small amount of bleeding on the anterior because of the sharp incision.    Dr. Matilde Sprang was present at this time and the case was handed over to him.  Pt was stable throughout entire portion of above procedure.  Sponge, lap, and instrument counts were correct x 2 when surgery was handed over the Dr. Matilde Sprang.  COUNTS:  YES  PLAN OF CARE: Transfer to PACU

## 2015-03-05 NOTE — H&P (Signed)
Tara Fernandez is an 65 y.o. female  G2P2 MWF here for hysterectomy, bilateral salpingectomy, possible BSO, and cystocele repair with vault suspension and possible rectocele repair, cystoscopy, with Dr. Matilde Sprang.  Pt has many questions this morning despite several office visits discussion surgery.  Procedures reviewed.  All questions answered.  Husband is with her today. Risks and benefits have all been discussed with pt and she is ready to proceed.  Pertinent Gynecological History: Menses: post-menopausal Bleeding: none Contraception: post menopausal status DES exposure: denies Blood transfusions: none Sexually transmitted diseases: no past history Previous GYN Procedures: none  Last mammogram: normal Date: 1/17 Last pap: normal Date: 02/23/15 OB History: G2, P2   Menstrual History: Patient's last menstrual period was 01/07/1996 (approximate).    Past Medical History  Diagnosis Date  . Arthritis     osteoarthritis; PAIN RIGHT HIP  . Vaginal pessary present     PT HAS SMALL BLOOD BLISTER  - UP INSIDE VAGINA - PT'S GYN DOCTOR- DR LATHROP- AWARE -TOLD TO USE PREMARIN VAGINAL CREAM.  PT DOES NOT HAVE VAGINAL BLEEDING  . PONV (postoperative nausea and vomiting)     Past Surgical History  Procedure Laterality Date  . Breast mass excision      Hx: of left breast  . Total hip arthroplasty Left 06/17/2012    Procedure: LEFT TOTAL HIP ARTHROPLASTY ANTERIOR APPROACH;  Surgeon: Mcarthur Rossetti, MD;  Location: [facility];  Service: Orthopedics;  Laterality: Left;  . Cartilage surgery  10 th grade    RIGHT KNEE  . Total hip arthroplasty Right 02/11/2013    Procedure: RIGHT TOTAL HIP ARTHROPLASTY ANTERIOR APPROACH;  Surgeon: Mcarthur Rossetti, MD;  Location: [facility];  Service: Orthopedics;  Laterality: Right;  . Breast surgery      Family History  Problem Relation Age of Onset  . Heart attack Father   . Cancer - Lung Sister   . Breast cancer Sister 36  . Cancer - Lung Mother      Social History:  reports that she has never smoked. She has never used smokeless tobacco. She reports that she drinks alcohol. She reports that she does not use illicit drugs.  Allergies:  Allergies  Allergen Reactions  . Tramadol Other (See Comments)    Causes numbness in hands and right side of body  . Chlorhexidine Itching  . Other     PT STATES SHE CAN TAKE HYDROCODONE/ACETAMINOPHEN FOR PAIN  WITH OUT ANY PROBLEMS.  STATES - IN THE PAST - OTHER PAIN MEDS CAUSED NAUSEA OR RASH OR FAST HEART BEAT - BUT SHE DOESN'T KNOW NAMES OF THOSE MEDS   STATES NO PROBLEMS WITH ANY PAIN MEDS GIVEN IN HOSPITAL.  Marland Kitchen Premarin [Conjugated Estrogens] Other (See Comments)    Headache, dizziness, and pelvic cramping.  . Ciprofloxacin Hives, Itching and Rash    "joint pain", patient reports hives, rash, and itching  . Latex Rash    HIVES - AFTER DENTIST USED LATEX GLOVES    Prescriptions prior to admission  Medication Sig Dispense Refill Last Dose  . HYDROcodone-acetaminophen (NORCO/VICODIN) 5-325 MG tablet Take 1-2 tablets by mouth every 6 (six) hours as needed for moderate pain or severe pain. 30 tablet 0   . ibuprofen (ADVIL,MOTRIN) 800 MG tablet Take 1 tablet (800 mg total) by mouth every 8 (eight) hours as needed. 30 tablet 0     Review of Systems  All other systems reviewed and are negative.   Blood pressure 112/75, pulse 75, temperature 97.9 F (  36.6 C), temperature source Oral, resp. rate 16, last menstrual period 01/07/1996, SpO2 100 %. Physical Exam  Constitutional: She is oriented to person, place, and time. She appears well-developed and well-nourished.  Cardiovascular: Normal rate and regular rhythm.   Respiratory: Effort normal.  Neurological: She is alert and oriented to person, place, and time.  Skin: Skin is warm and dry.  Psychiatric: She has a normal mood and affect.    No results found for this or any previous visit (from the past 24 hour(s)).  No results  found.  Assessment/Plan: 65 yo G4P4 MWF here for LAVH/bilateral salpingectomy/possible BSO and prolapse repair with Dr. Matilde Sprang.  Pt ready to proceed.  [name] 03/05/2015, 7:06 AM

## 2015-03-05 NOTE — Transfer of Care (Signed)
Immediate Anesthesia Transfer of Care Note  Patient: Tara Fernandez  Procedure(s) Performed: Procedure(s): LAPAROSCOPIC ASSISTED VAGINAL HYSTERECTOMY WITH SALPINGECTOMY (Bilateral) CYSTOSCOPY (N/A) ANTERIOR (CYSTOCELE) REPAIR, VAULT PROLAPSE AND GRAFT (N/A)  Patient Location: PACU  Anesthesia Type:General  Level of Consciousness: awake, alert , oriented and patient cooperative  Airway & Oxygen Therapy: Patient Spontanous Breathing and Patient connected to nasal cannula oxygen  Post-op Assessment: Report given to RN and Post -op Vital signs reviewed and stable  Post vital signs: Reviewed and stable  Last Vitals:  Filed Vitals:   03/05/15 0613  BP: 112/75  Pulse: 75  Temp: 36.6 C  Resp: 16    Complications: No apparent anesthesia complications

## 2015-03-06 ENCOUNTER — Encounter (HOSPITAL_COMMUNITY): Payer: Self-pay | Admitting: Obstetrics & Gynecology

## 2015-03-06 DIAGNOSIS — N812 Incomplete uterovaginal prolapse: Secondary | ICD-10-CM | POA: Diagnosis not present

## 2015-03-06 LAB — CBC
HEMATOCRIT: 34.5 % — AB (ref 36.0–46.0)
HEMOGLOBIN: 11.7 g/dL — AB (ref 12.0–15.0)
MCH: 30.7 pg (ref 26.0–34.0)
MCHC: 33.9 g/dL (ref 30.0–36.0)
MCV: 90.6 fL (ref 78.0–100.0)
PLATELETS: 247 10*3/uL (ref 150–400)
RBC: 3.81 MIL/uL — ABNORMAL LOW (ref 3.87–5.11)
RDW: 13.1 % (ref 11.5–15.5)
WBC: 11.1 10*3/uL — ABNORMAL HIGH (ref 4.0–10.5)

## 2015-03-06 LAB — BASIC METABOLIC PANEL
ANION GAP: 4 — AB (ref 5–15)
BUN: 9 mg/dL (ref 6–20)
CHLORIDE: 109 mmol/L (ref 101–111)
CO2: 28 mmol/L (ref 22–32)
Calcium: 8.4 mg/dL — ABNORMAL LOW (ref 8.9–10.3)
Creatinine, Ser: 0.63 mg/dL (ref 0.44–1.00)
GFR calc Af Amer: >60 mL/min (ref >60)
GFR calc non Af Amer: >60 mL/min (ref >60)
GLUCOSE: 130 mg/dL — AB (ref 65–99)
POTASSIUM: 3.6 mmol/L (ref 3.5–5.1)
Sodium: 141 mmol/L (ref 135–145)

## 2015-03-06 NOTE — Discharge Summary (Signed)
Physician Discharge Summary  Patient ID: Tara Fernandez MRN: [id number] DOB/AGE: 1950-08-01 65 y.o.  Admit date: 03/05/2015 Discharge date: 03/06/2015  Admission Diagnoses: incomplete uterine prolapse, cystocele, pessary use  Discharge Diagnoses:  Active Problems:   * No active hospital problems. *   Discharged Condition: good  Hospital Course: Patient admitted through same day surgery.  She was taken to OR where LAVH/bilateral salpingectomy/anterior repair/vault suspension with graft and cystoscopy were performed.  Surgical findings included normal pelvix.  Surgery was uneventful.  Patient transferred to PACU where she was stable and then to 3rd floor for the remainder of her hospitalization.  During her post-op recovery, her vitals and stable and she was AF.  In evening of POD#0, she was able to transition to oral pain medications and regular diet.  She was able to ambulate and she had good pain control.  Patient seen both in the evening of POD#0 and AM of POD#1.  In the AM of POD#1, she was without complaint.  Foley removed in am of POD #1 and pt was able to void without difficult.  Post op hb was 11.7.  At this point, patient was voiding, walking, having excellent pain control, had no nausea, and minimal vaginal bleeding.  She was ready for D/C.  Consults: None   gnificant Diagnostic Studies: labs: post op hb 11.7  Treatments: surgery: LAVH/bilateral salpingectomy/anterior repair/vault suspension with graft and cystoscopy   Discharge Exam: Blood pressure 92/53, pulse 69, temperature 98.7 F (37.1 C), temperature source Oral, resp. rate 16, height 5\' 3"  (1.6 m), weight 122 lb (55.339 kg), last menstrual period 01/07/1996, SpO2 97 %. General appearance: alert and appears stated age Resp: clear to auscultation bilaterally Cardio: regular rate and rhythm, S1, S2 normal, no murmur, click, rub or gallop GI: soft, non-tender; bowel sounds normal; no masses,  no  organomegaly Incision/Wound: c/d/i  Gyn: minimal spotting  Disposition: 01-Home or Self Care     Medication List    TAKE these medications        HYDROcodone-acetaminophen 5-325 MG tablet  Commonly known as:  NORCO/VICODIN  Take 1-2 tablets by mouth every 6 (six) hours as needed for moderate pain or severe pain.     ibuprofen 800 MG tablet  Commonly known as:  ADVIL,MOTRIN  Take 1 tablet (800 mg total) by mouth every 8 (eight) hours as needed.           Follow-up Information    Follow up with Lyman Speller, MD On 03/08/2015.   Specialty:  Gynecology   Contact information:   [address] [place] [address] [phone]       Signed: Lyman Speller 03/06/2015, 1:04 PM

## 2015-03-06 NOTE — Progress Notes (Signed)
Pt isdischarged in the care of husband. Spirits  Are good. Abdominal incisions are clean and dry. Scanty amt of vaginal bleeding notedc on Vaginal bleeding. Pt understands all discharged  Instructions well.

## 2015-03-06 NOTE — Anesthesia Postprocedure Evaluation (Signed)
Anesthesia Post Note  Patient: Tara Fernandez  Procedure(s) Performed: Procedure(s) (LRB): LAPAROSCOPIC ASSISTED VAGINAL HYSTERECTOMY WITH SALPINGECTOMY (Bilateral) CYSTOSCOPY (N/A) ANTERIOR (CYSTOCELE) REPAIR, VAULT PROLAPSE AND GRAFT (N/A)  Patient location during evaluation: [facility] Anesthesia Type: General Level of consciousness: awake, awake and alert, oriented and patient cooperative Pain management: pain level controlled Vital Signs Assessment: post-procedure vital signs reviewed and stable Respiratory status: spontaneous breathing, nonlabored ventilation and respiratory function stable Cardiovascular status: stable Postop Assessment: no signs of nausea or vomiting Anesthetic complications: no    Last Vitals:  Filed Vitals:   03/06/15 0144 03/06/15 0511  BP: 90/53 103/57  Pulse: 69 58  Temp: 36.9 C 36.6 C  Resp: 14     Last Pain:  Filed Vitals:   03/06/15 0511  PainSc: 0-No pain                 Britanny Marksberry L

## 2015-03-06 NOTE — Progress Notes (Signed)
1 Day Post-Op Procedure(s) (LRB): LAPAROSCOPIC ASSISTED VAGINAL HYSTERECTOMY WITH SALPINGECTOMY (Bilateral) CYSTOSCOPY (N/A) ANTERIOR (CYSTOCELE) REPAIR, VAULT PROLAPSE AND GRAFT (N/A)  Subjective: Patient reports good pain control.  Passing flatus.  Eating regular diet.  Hasn't taken anything for pain control since morphine and Toradol.  Questions answered.  Walking without assistance.  Objective: I have reviewed patient's vital signs, intake and output, medications and labs. Hb 11.7.  WBC 11.1 BMP nl except glucose 130 and calcium 8.4  General: alert and cooperative Resp: clear to auscultation bilaterally Cardio: regular rate and rhythm, S1, S2 normal, no murmur, click, rub or gallop GI: soft, non-tender; bowel sounds normal; no masses,  no organomegaly and incision: clean, dry and intact Extremities: extremities normal, atraumatic, no cyanosis or edema Vaginal Bleeding: minimal and and vaginal packing removed  Foley removed as well.  Assessment: s/p Procedure(s): LAPAROSCOPIC ASSISTED VAGINAL HYSTERECTOMY WITH SALPINGECTOMY (Bilateral) CYSTOSCOPY (N/A) ANTERIOR (CYSTOCELE) REPAIR, VAULT PROLAPSE AND GRAFT (N/A): stable and progressing well  Plan: Encourage ambulation Advance to PO medication D/C IV  Bladder trials today Hopeful discharge later     [name] [facility] 03/06/2015, 7:28 AM

## 2015-03-06 NOTE — Addendum Note (Signed)
Addendum  created 03/06/15 0743 by Raenette Rover, CRNA   Modules edited: Clinical Notes   Clinical Notes:  File: [id number]

## 2015-03-06 NOTE — Progress Notes (Signed)
Looks great  Vitals and labs normal Detail instructions

## 2015-03-06 NOTE — Discharge Instructions (Signed)

## 2015-03-07 ENCOUNTER — Telehealth: Payer: Self-pay | Admitting: Emergency Medicine

## 2015-03-07 NOTE — Telephone Encounter (Signed)
-----   Message from Megan Salon, MD sent at 03/07/2015 10:35 AM EST ----- Please call pt and let her know her pathology from her surgery was completely negative.  We will review this together at her post op visit next week.  Thanks.

## 2015-03-07 NOTE — Telephone Encounter (Signed)
Call to patient and she is given message from Dr. Sabra Heck. She verbalizes understanding and states, "I am doing great!" She will call back with any concerns prior to appointment for post op. Routing to provider for final review. Patient agreeable to disposition. Will close encounter.

## 2015-03-12 ENCOUNTER — Ambulatory Visit: Payer: BLUE CROSS/BLUE SHIELD | Admitting: Obstetrics & Gynecology

## 2015-03-15 ENCOUNTER — Ambulatory Visit (INDEPENDENT_AMBULATORY_CARE_PROVIDER_SITE_OTHER): Payer: BLUE CROSS/BLUE SHIELD | Admitting: Obstetrics & Gynecology

## 2015-03-15 ENCOUNTER — Encounter: Payer: Self-pay | Admitting: Obstetrics & Gynecology

## 2015-03-15 VITALS — BP 118/68 | HR 76 | Resp 14 | Ht 63.0 in | Wt 124.4 lb

## 2015-03-15 DIAGNOSIS — Z9889 Other specified postprocedural states: Secondary | ICD-10-CM

## 2015-03-15 NOTE — Progress Notes (Signed)
Post Operative Visit  Procedure: LAPAROSCOPIC ASSISTED VAGINAL HYSTERECTOMY WITH SALPINGECTOMY, CYSTOSCOPY, ANTERIOR (CYSTOCELE) AND POSTERIOR REPAIR (RECTOCELE)    Days Post-op: 11  Subjective: Doing very well.  Reports minimal spotting.  Pt was seen by Dr. Matilde Sprang on Tuesday and did have some bleeding.  Voiding without difficulty.  Pt having regular bowel movement.  Not taking any stool softeners.    Reports she feels really good but energy is a little decreased.    Reports her left groin has bothered her and she's done "needling" and massage.  Would like to start yoga stretching back.    Objective: BP 118/68 mmHg  Pulse 76  Resp 14  Ht 5\' 3"  (1.6 m)  Wt 124 lb 6.4 oz (56.427 kg)  BMI 22.04 kg/m2  LMP 01/07/1996 (Approximate)  EXAM General: alert and no distress Resp: clear to auscultation bilaterally Cardio: regular rate and rhythm, S1, S2 normal, no murmur, click, rub or gallop GI: soft, non-tender; bowel sounds normal; no masses,  no organomegaly and incision: clean Extremities: extremities normal, atraumatic, no cyanosis or edema Vaginal Bleeding: minimal  Assessment: s/p LAVH/bilateral salpingectomy/cystoscopy/anterior repair  Plan: Recheck 3-4 weeks

## 2015-03-23 ENCOUNTER — Other Ambulatory Visit: Payer: Self-pay | Admitting: Obstetrics & Gynecology

## 2015-03-23 MED ORDER — IBUPROFEN 800 MG PO TABS: 800.0000 mg | ORAL_TABLET | Freq: Three times a day (TID) | ORAL | Status: DC | PRN

## 2015-03-23 NOTE — Telephone Encounter (Signed)
Medication refill request: Ibuprofen 800 mg  Last AEX:  10/15/2012 with TL Next AEX: No AEX scheduled  Last MMG (if hormonal medication request): n/a Refill authorized: #30, please advise.

## 2015-03-23 NOTE — Telephone Encounter (Signed)
Patient called and requested refills on her 800 mg ibuprofen.

## 2015-03-26 NOTE — Telephone Encounter (Signed)
Patient aware rx has been sent to pharmacy she picked it up Saturday.

## 2015-04-03 ENCOUNTER — Ambulatory Visit: Payer: BLUE CROSS/BLUE SHIELD | Admitting: Obstetrics & Gynecology

## 2015-04-03 ENCOUNTER — Encounter: Payer: Self-pay | Admitting: Obstetrics & Gynecology

## 2015-04-03 ENCOUNTER — Ambulatory Visit (INDEPENDENT_AMBULATORY_CARE_PROVIDER_SITE_OTHER): Payer: BLUE CROSS/BLUE SHIELD | Admitting: Obstetrics & Gynecology

## 2015-04-03 VITALS — BP 110/70 | HR 80 | Resp 16 | Ht 63.0 in | Wt 124.0 lb

## 2015-04-03 DIAGNOSIS — Z9889 Other specified postprocedural states: Secondary | ICD-10-CM

## 2015-04-03 NOTE — Progress Notes (Addendum)
Post Operative Visit  Procedure: LAPAROSCOPIC ASSISTED VAGINAL HYSTERECTOMY WITH SALPINGECTOMY   CYSTOSCOPY   ANTERIOR (CYSTOCELE) AND POSTERIOR REPAIR (RECTOCELE)   Days Post-op: 4 weeks   Subjective: Has questions about whether she needs progesterone.  She tried some OTC product and felt "crazy" so she stopped.  Still having some discharge.  Bladder function is good.  She is having some constipation.  Wants to know  Objective: BP 110/70 mmHg  Pulse 80  Resp 16  Ht 5\' 3"  (1.6 m)  Wt 124 lb (56.246 kg)  BMI 21.97 kg/m2  LMP 01/07/1996 (Approximate)  EXAM General: alert and cooperative Resp: clear to auscultation bilaterally Cardio: regular rate and rhythm, S1, S2 normal, no murmur, click, rub or gallop GI: soft, non-tender; bowel sounds normal; no masses,  no organomegaly Extremities: extremities normal, atraumatic, no cyanosis or edema Vaginal Bleeding: minimal  Gyn: NAEFG, vagina pink and without erythema.  Sutures still intact.  Mild bleeding occurred with examination with speculum.  Assessment: s/p LAVH with salpingectomy, cystoscopy, Anterior repair with vault suspension  Plan: Recheck with Dr. Matilde Sprang in mid May Return in one year with me or prn

## 2015-04-03 NOTE — Patient Instructions (Signed)
Benefiber twice daily OR Colace (docusate sodium)  Take 200mg  twice daily OR Miralax daily

## 2015-04-30 ENCOUNTER — Telehealth: Payer: Self-pay | Admitting: Obstetrics & Gynecology

## 2015-04-30 NOTE — Telephone Encounter (Signed)
Spoke with patient. Patient had a laparoscopic vaginal hysterectomy on 03/05/2015 with Dr.Miller. Patient would like to know if she can have intercourse. Advised it is okay for her to have intercourse at this time. Patient is also requesting a refill of her Ibuprofen 800 mg rx. States when she had surgery on 03/05/2015 her hips were irritated due to the position they were placed in as she has had a hip replacement. Reports discomfort in her hips is intermittent, but when it occurs it is moderate. Ibuprofen helps to relieve the discomfort. Reports she is also working on hip exercises to help with this. Advised I will speak with Dr.Silva regarding medication refill request and return call with further recommendations as Dr.Miller is out of the office this week. She is agreeable.

## 2015-04-30 NOTE — Telephone Encounter (Signed)
1. Patient called and said, "When can I have sex? I'd just like to speak with the nurse about this"  2. Patient requesting a refill on 800 mg ibuprofen. Pharmacy on file is correct.

## 2015-04-30 NOTE — Telephone Encounter (Signed)
Patient had hysterectomy and prolapse repair.  Avoid sexual activity, exercise, and lifting over 10 pounds for 12 week post op.  Patient may call Dr. Matilde Sprang to see if his recommendations are different as he preformed the prolapse repair.  Thank you.

## 2015-04-30 NOTE — Telephone Encounter (Signed)
Ok to refill Ibuprofen 800 mg po q 8 hours prn pain.  #30, RF one.  Eat food prior to taking medication as it can cause gastritis.

## 2015-04-30 NOTE — Telephone Encounter (Signed)
Dr.Silva, is it okay to refill the patient rx for Ibuprofen 800 mg take 1 tablet by mouth every 8 hours as needed at this time?

## 2015-05-01 MED ORDER — IBUPROFEN 800 MG PO TABS: 800.0000 mg | ORAL_TABLET | Freq: Three times a day (TID) | ORAL | Status: DC | PRN

## 2015-05-01 NOTE — Telephone Encounter (Signed)
Spoke with patient. Advised of message as seen below from [place] regarding sexual activity, exercise, and lifting. She will contact Dr.MacDiarmid to discuss. Advised rx for Ibuprofen 800 mg po q 8 hours prn for pain #30 0RF sent to pharmacy on file. Advised of precautions for gastritis. Patient is agreeable.  Routing to provider for final review. Patient agreeable to disposition. Will close encounter.

## 2015-06-19 ENCOUNTER — Telehealth: Payer: Self-pay | Admitting: Obstetrics & Gynecology

## 2015-06-19 NOTE — Telephone Encounter (Signed)
Patient is asking "what antibiotic can I take according to my records"? Patient said you may leave details on her voicemail.

## 2015-06-19 NOTE — Telephone Encounter (Signed)
Spoke with patient. Patient states that she is going to have fillings removed and replaced at the dentist. Asking what antibiotic she had a reaction to in the past. Advised per our records she had a reaction to Cipro. Asking what other antibiotic she has been on. Advised she has taken Macrobid without negative side effects per our records on 07/04/2013 and 10/07/2013. She is agreeable and verbalizes understanding.  Routing to provider for final review. Patient agreeable to disposition. Will close encounter.

## 2015-09-23 IMAGING — CR DG PORTABLE PELVIS
1 series · 1 of 1 positions shown · non-contrast
Comparison: Portable lateral right hip x-ray obtained concurrently.
Portable pelvis x-ray 06/17/2012.

CLINICAL DATA: Postop right total hip arthroplasty via anterior
approach.

EXAM:
PORTABLE PELVIS 1-2 VIEWS

[AP]
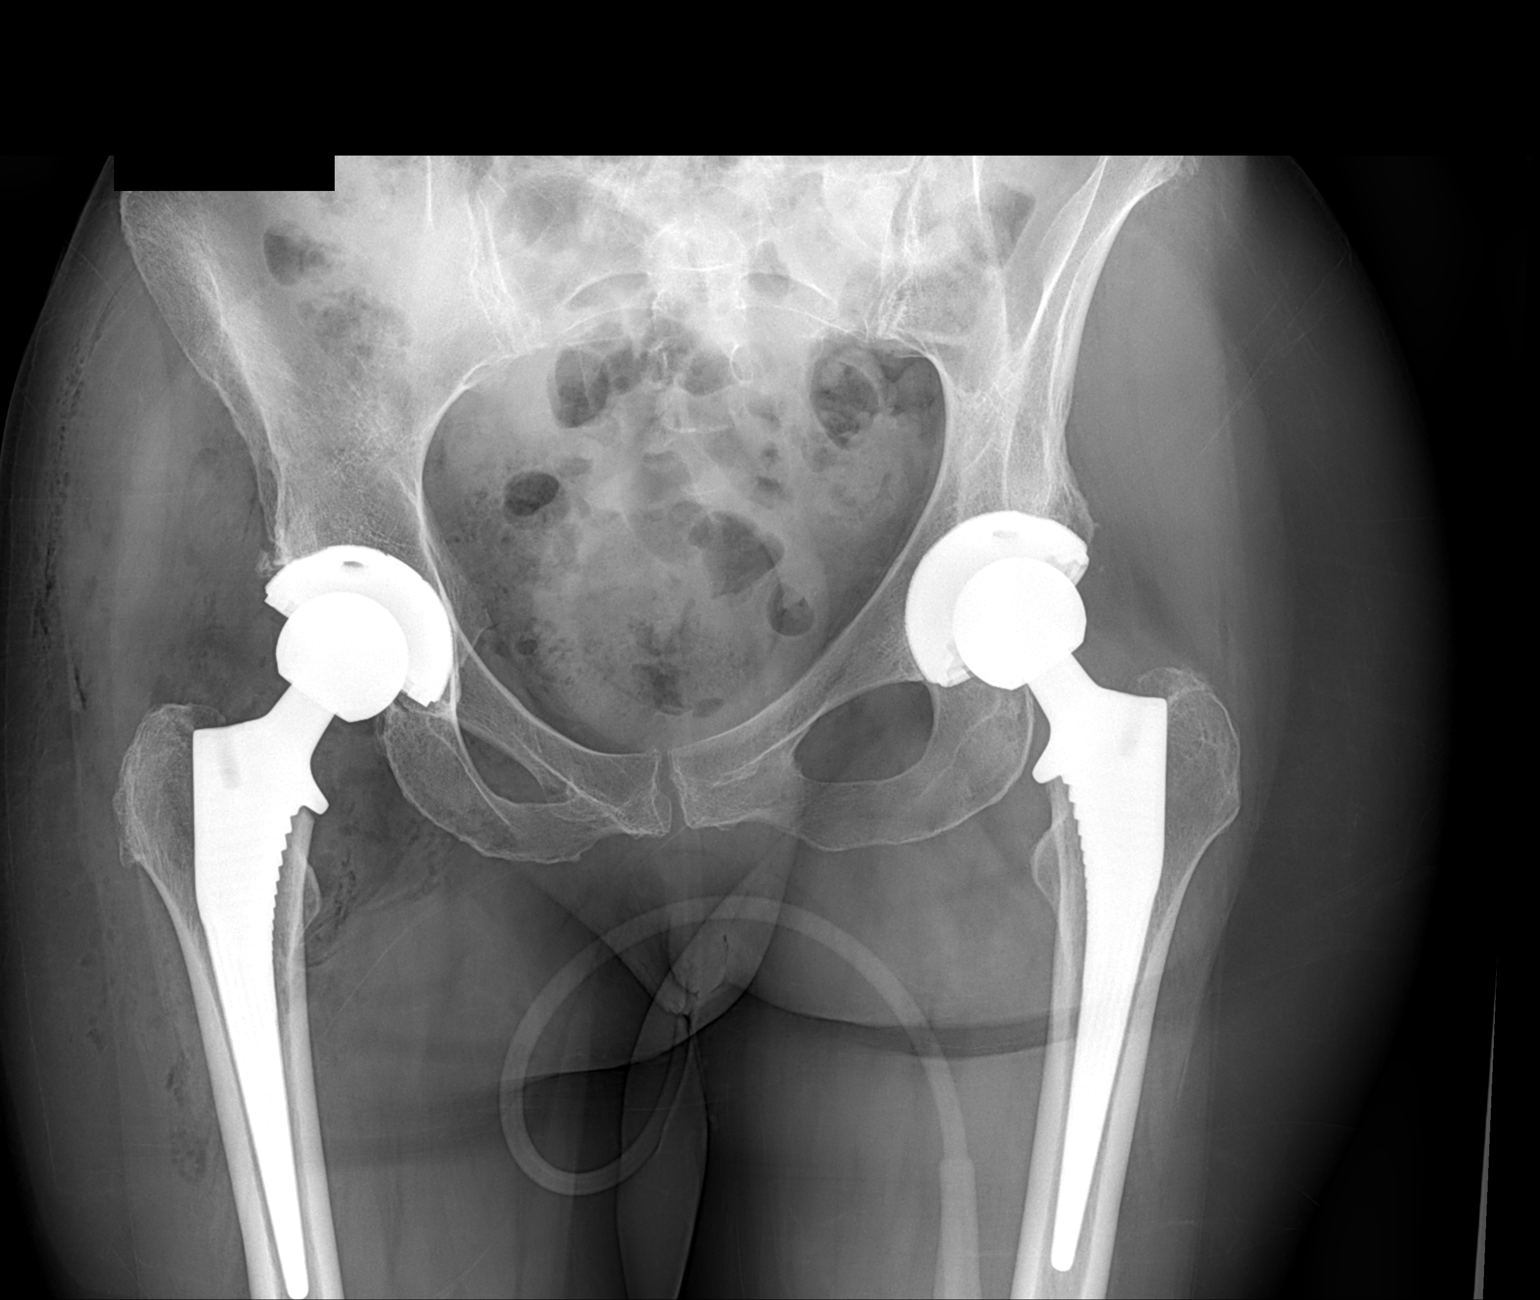

[1 of 1 positions shown; findings below may reference images not displayed]

FINDINGS: Anatomic alignment in the AP projection post right hip arthroplasty.
No acute complicating features. Calcification adjacent to the roof
of the acetabulum again noted and unchanged from the prior
examination.
IMPRESSION: Anatomic alignment post right hip arthroplasty without acute
complicating features.

## 2015-10-10 ENCOUNTER — Telehealth: Payer: Self-pay | Admitting: Obstetrics & Gynecology

## 2015-10-10 NOTE — Telephone Encounter (Signed)
Patient is asking when she is due to return to see Dr.Miller? Patient is in recall for February 2018. Patient is asking if she should be seen any sooner?

## 2015-10-10 NOTE — Telephone Encounter (Signed)
Spoke with patient. Patient calling to clarify AEX appointment with Dr. Sabra Heck. Patient states she was not sure after her hysterectomy if she needed a "Yearly exam". Advised she would still need to keep annual exam, no pap. Patient in 02 recall for 02/25/16. Offered to make appointment at this time. Patient states she does not have planner with her, will call office to schedule appointment.  Patient verbalizes understanding and is agreeable.    Routing to provider for final review. Patient is agreeable to disposition. Will close encounter.

## 2015-10-24 ENCOUNTER — Ambulatory Visit (INDEPENDENT_AMBULATORY_CARE_PROVIDER_SITE_OTHER): Payer: BLUE CROSS/BLUE SHIELD | Admitting: Physician Assistant

## 2015-12-05 ENCOUNTER — Ambulatory Visit (INDEPENDENT_AMBULATORY_CARE_PROVIDER_SITE_OTHER): Payer: BLUE CROSS/BLUE SHIELD | Admitting: Physician Assistant

## 2015-12-13 ENCOUNTER — Ambulatory Visit (INDEPENDENT_AMBULATORY_CARE_PROVIDER_SITE_OTHER): Payer: BLUE CROSS/BLUE SHIELD | Admitting: Physician Assistant

## 2015-12-13 ENCOUNTER — Ambulatory Visit (INDEPENDENT_AMBULATORY_CARE_PROVIDER_SITE_OTHER): Payer: Self-pay

## 2015-12-13 DIAGNOSIS — M25561 Pain in right knee: Secondary | ICD-10-CM

## 2015-12-13 DIAGNOSIS — M25552 Pain in left hip: Secondary | ICD-10-CM

## 2015-12-13 MED ORDER — LIDOCAINE HCL 1 % IJ SOLN
3.0000 mL | INTRAMUSCULAR | Status: AC | PRN
  Administered 2015-12-13: 3 mL

## 2015-12-13 MED ORDER — METHYLPREDNISOLONE ACETATE 40 MG/ML IJ SUSP
40.0000 mg | INTRAMUSCULAR | Status: AC | PRN
  Administered 2015-12-13: 40 mg via INTRA_ARTICULAR

## 2015-12-13 NOTE — Progress Notes (Signed)
Office Visit Note   Patient: Tara Fernandez           Date of Birth: August 10, 1950           MRN: [id number] Visit Date: 12/13/2015              Requested by: No referring provider defined for this encounter. PCP: No PCP Per Patient   Assessment & Plan: Visit Diagnoses:  1. Acute pain of right knee   2. Pain in left hip     Plan: We'll have her follow up 2 weeks to check her response to  both injections. She may benefit for supplemental from a supplemental injection right knee in the future. States she is not interested in any type of knee replacement. Also discussed with her possible left hip bursectomy due to the chronic nature of her left hip trochanteric bursitis. She'll continue IT band stretching and quad exercises.  Follow-Up Instructions: Return in about 2 weeks (around 12/27/2015).   Orders:  Orders Placed This Encounter  Procedures  . Large Joint Injection/Arthrocentesis  . Large Joint Injection/Arthrocentesis  . XR Knee 1-2 Views Right  . XR HIP UNILAT W OR W/O PELVIS 1V LEFT   No orders of the defined types were placed in this encounter.     Procedures: Large Joint Inj Date/Time: 12/13/2015 4:59 PM Performed by: Pete Pelt Authorized by: Pete Pelt   Consent Given by:  Patient Indications:  Pain Location:  Knee Site:  R knee Needle Size:  22 G Approach:  Anterolateral Ultrasound Guidance: No   Fluoroscopic Guidance: No   Medications:  40 mg methylPREDNISolone acetate 40 MG/ML; 3 mL lidocaine 1 % Aspiration Attempted: No   Patient tolerance:  Patient tolerated the procedure well with no immediate complications Large Joint Inj Date/Time: 12/13/2015 5:00 PM Performed by: Pete Pelt Authorized by: Pete Pelt   Consent Given by:  Patient Indications:  Pain Location:  Hip Site:  L greater trochanter Needle Size:  22 G Needle Length:  1.5 inches Approach:  Lateral Ultrasound Guidance: No   Fluoroscopic Guidance: No   Arthrogram: No    Medications:  40 mg methylPREDNISolone acetate 40 MG/ML; 3 mL lidocaine 1 % Aspiration Attempted: No   Patient tolerance:  Patient tolerated the procedure well with no immediate complications     Clinical Data: No additional findings.   Subjective: No chief complaint on file.   HPI  Review of Systems See history of present illness  Objective: Vital Signs: LMP 01/07/1996 (Approximate)   Physical Exam  Constitutional: She is oriented to person, place, and time. She appears well-developed and well-nourished.  Pulmonary/Chest: Effort normal.  Neurological: She is alert and oriented to person, place, and time.  Psychiatric: She has a normal mood and affect.    Ortho Exam Bilateral hips excellent range of motion without significant pain. Extreme external rotation left hip causes some discomfort laterally. She has tenderness over the left trochanteric region. Bilateral knees good range of motion. Significant valgus deformity of the right knee. I can correct her valgus deformity. Tenderness over the lateral joint line and pes anserinus region of the right knee. Bilateral knees without rashes skin lesions ulcerations or effusion.  Well-healed surgical incision over the lateral aspect of her right knee. Specialty Comments:  No specialty comments available.  Imaging: Xr Hip Unilat W Or W/o Pelvis 1v Left  Result Date: 12/13/2015 AP pelvis and lateral view of the left hip: No acute fracture. Lateral hips  well located. Status post bilateral total hip arthroplasties COMPONENTS well-seated. No evidence of eccentric wear of either hip.  Xr Knee 1-2 Views Right  Result Date: 12/13/2015 Right knee 2 views: No acute fractures. Valgus deformity with end-stage lateral compartmental arthritis. Mild to moderate patellofemoral arthritis. Medial compartment well preserved.    PMFS History: Patient Active Problem List   Diagnosis Date Noted  . Chondromalacia of right patellofemoral joint  05/18/2013  . Leg length discrepancy 04/15/2013  . Arthritis of right hip 02/11/2013  . Status post THR (total hip replacement) 02/11/2013  . Degenerative arthritis of hip 06/17/2012   Past Medical History:  Diagnosis Date  . Arthritis    osteoarthritis; PAIN RIGHT HIP  . PONV (postoperative nausea and vomiting)   . Vaginal pessary present    PT HAS SMALL BLOOD BLISTER  - UP INSIDE VAGINA - PT'S GYN DOCTOR- DR LATHROP- AWARE -TOLD TO USE PREMARIN VAGINAL CREAM.  PT DOES NOT HAVE VAGINAL BLEEDING    Family History  Problem Relation Age of Onset  . Heart attack Father   . Cancer - Lung Sister   . Breast cancer Sister 3  . Cancer - Lung Mother     Past Surgical History:  Procedure Laterality Date  . ABDOMINAL HYSTERECTOMY  03/05/15   LAPAROSCOPIC ASSISTED VAGINAL HYSTERECTOMY WITH SALPINGECTOMY  . ANTERIOR AND POSTERIOR REPAIR N/A 03/05/2015   Procedure: ANTERIOR (CYSTOCELE) REPAIR, VAULT PROLAPSE AND GRAFT;  Surgeon: Bjorn Loser, MD;  Location: [facility];  Service: Urology;  Laterality: N/A;  . BREAST MASS EXCISION     Hx: of left breast  . BREAST SURGERY    . CARTILAGE SURGERY  10 th grade   RIGHT KNEE  . CYSTOSCOPY N/A 03/05/2015   Procedure: CYSTOSCOPY;  Surgeon: Bjorn Loser, MD;  Location: [facility];  Service: Urology;  Laterality: N/A;  . LAPAROSCOPIC VAGINAL HYSTERECTOMY WITH SALPINGECTOMY Bilateral 03/05/2015   Procedure: LAPAROSCOPIC ASSISTED VAGINAL HYSTERECTOMY WITH SALPINGECTOMY;  Surgeon: Megan Salon, MD;  Location: [facility];  Service: Gynecology;  Laterality: Bilateral;  . TOTAL HIP ARTHROPLASTY Left 06/17/2012   Procedure: LEFT TOTAL HIP ARTHROPLASTY ANTERIOR APPROACH;  Surgeon: Mcarthur Rossetti, MD;  Location: [facility];  Service: Orthopedics;  Laterality: Left;  . TOTAL HIP ARTHROPLASTY Right 02/11/2013   Procedure: RIGHT TOTAL HIP ARTHROPLASTY ANTERIOR APPROACH;  Surgeon: Mcarthur Rossetti, MD;  Location: [facility];  Service: Orthopedics;  Laterality: Right;    Social History   Occupational History  . Not on file.   Social History Main Topics  . Smoking status: Never Smoker  . Smokeless tobacco: Never Used  . Alcohol use 0.0 oz/week     Comment: occasional WINE  . Drug use: No  . Sexual activity: Yes    Partners: Male    Birth control/ protection: Post-menopausal

## 2015-12-27 ENCOUNTER — Ambulatory Visit (INDEPENDENT_AMBULATORY_CARE_PROVIDER_SITE_OTHER): Payer: BLUE CROSS/BLUE SHIELD | Admitting: Physician Assistant

## 2015-12-27 DIAGNOSIS — M25552 Pain in left hip: Secondary | ICD-10-CM

## 2015-12-27 NOTE — Progress Notes (Signed)
Office Visit Note   Patient: Tara Fernandez           Date of Birth: 1950/05/15           MRN: [id number] Visit Date: 12/27/2015              Requested by: No referring provider defined for this encounter. PCP: No PCP Per Patient   Assessment & Plan: Visit Diagnoses:  1. Pain in left hip     Plan: MRI of the left hip to rule out an iliopsoas tendinitis. We'll have her back 2 weeks after the MRI to go over results and discuss further treatment  Follow-Up Instructions: Return in about 2 weeks (around 01/10/2016) for after MRI.   Orders:  Orders Placed This Encounter  Procedures  . MR Hip Left w/o contrast   No orders of the defined types were placed in this encounter.     Procedures: No procedures performed   Clinical Data: No additional findings.   Subjective: Chief Complaint  Patient presents with  . Right Knee - Follow-up  . Left Hip - Follow-up    HPI Tara Fernandez returns today follow-up status post trochanteric injection left hip and right knee injection. She states the trochanteric injection but she still has pain in the hip with flexion transferring in and out of a car going up or down stairs or up and down hills. Her right knee she states right knee is doing well. Review of Systems   Objective: Vital Signs: LMP 01/07/1996 (Approximate)   Physical Exam  Ortho Exam Left hip overall good range of motion actively. She uses no assistive device to ambulate. Subjective pain in the left hip with hip flexion. Specialty Comments:  No specialty comments available.  Imaging: No results found.   PMFS History: Patient Active Problem List   Diagnosis Date Noted  . Chondromalacia of right patellofemoral joint 05/18/2013  . Leg length discrepancy 04/15/2013  . Arthritis of right hip 02/11/2013  . Status post THR (total hip replacement) 02/11/2013  . Degenerative arthritis of hip 06/17/2012   Past Medical History:  Diagnosis Date  . Arthritis    osteoarthritis; PAIN RIGHT HIP  . PONV (postoperative nausea and vomiting)   . Vaginal pessary present    PT HAS SMALL BLOOD BLISTER  - UP INSIDE VAGINA - PT'S GYN DOCTOR- Tara Fernandez- AWARE -TOLD TO USE PREMARIN VAGINAL CREAM.  PT DOES NOT HAVE VAGINAL BLEEDING    Family History  Problem Relation Age of Onset  . Heart attack Father   . Cancer - Lung Sister   . Breast cancer Sister 8  . Cancer - Lung Mother     Past Surgical History:  Procedure Laterality Date  . ABDOMINAL HYSTERECTOMY  03/05/15   LAPAROSCOPIC ASSISTED VAGINAL HYSTERECTOMY WITH SALPINGECTOMY  . ANTERIOR AND POSTERIOR REPAIR N/A 03/05/2015   Procedure: ANTERIOR (CYSTOCELE) REPAIR, VAULT PROLAPSE AND GRAFT;  Surgeon: Bjorn Loser, MD;  Location: [facility];  Service: Urology;  Laterality: N/A;  . BREAST MASS EXCISION     Hx: of left breast  . BREAST SURGERY    . CARTILAGE SURGERY  10 th grade   RIGHT KNEE  . CYSTOSCOPY N/A 03/05/2015   Procedure: CYSTOSCOPY;  Surgeon: Bjorn Loser, MD;  Location: [facility];  Service: Urology;  Laterality: N/A;  . LAPAROSCOPIC VAGINAL HYSTERECTOMY WITH SALPINGECTOMY Bilateral 03/05/2015   Procedure: LAPAROSCOPIC ASSISTED VAGINAL HYSTERECTOMY WITH SALPINGECTOMY;  Surgeon: Megan Salon, MD;  Location: [facility];  Service: Gynecology;  Laterality: Bilateral;  . TOTAL HIP ARTHROPLASTY Left 06/17/2012   Procedure: LEFT TOTAL HIP ARTHROPLASTY ANTERIOR APPROACH;  Surgeon: Mcarthur Rossetti, MD;  Location: [facility];  Service: Orthopedics;  Laterality: Left;  . TOTAL HIP ARTHROPLASTY Right 02/11/2013   Procedure: RIGHT TOTAL HIP ARTHROPLASTY ANTERIOR APPROACH;  Surgeon: Mcarthur Rossetti, MD;  Location: [facility];  Service: Orthopedics;  Laterality: Right;   Social History   Occupational History  . Not on file.   Social History Main Topics  . Smoking status: Never Smoker  . Smokeless tobacco: Never Used  . Alcohol use 0.0 oz/week     Comment: occasional WINE  . Drug use: No  . Sexual  activity: Yes    Partners: Male    Birth control/ protection: Post-menopausal

## 2015-12-28 ENCOUNTER — Telehealth (INDEPENDENT_AMBULATORY_CARE_PROVIDER_SITE_OTHER): Payer: Self-pay | Admitting: Physician Assistant

## 2015-12-28 NOTE — Telephone Encounter (Signed)
Pt requesting to schedule her mri before the end of December because she met her deductible. Pt number is [phone]

## 2016-01-06 ENCOUNTER — Ambulatory Visit
Admission: RE | Admit: 2016-01-06 | Discharge: 2016-01-06 | Disposition: A | Discharge status: Home / Self Care | Payer: BLUE CROSS/BLUE SHIELD | Source: Ambulatory Visit | Attending: Physician Assistant | Admitting: Physician Assistant

## 2016-01-06 DIAGNOSIS — M25552 Pain in left hip: Secondary | ICD-10-CM

## 2016-01-10 NOTE — Telephone Encounter (Signed)
Done, MRI completed Dec 30

## 2016-01-16 ENCOUNTER — Encounter (INDEPENDENT_AMBULATORY_CARE_PROVIDER_SITE_OTHER): Payer: Self-pay | Admitting: Physician Assistant

## 2016-01-16 ENCOUNTER — Telehealth: Payer: Self-pay | Admitting: Obstetrics & Gynecology

## 2016-01-16 ENCOUNTER — Ambulatory Visit (INDEPENDENT_AMBULATORY_CARE_PROVIDER_SITE_OTHER): Payer: Medicare HMO | Admitting: Physician Assistant

## 2016-01-16 DIAGNOSIS — M7062 Trochanteric bursitis, left hip: Secondary | ICD-10-CM | POA: Diagnosis not present

## 2016-01-16 DIAGNOSIS — N83202 Unspecified ovarian cyst, left side: Secondary | ICD-10-CM

## 2016-01-16 NOTE — Telephone Encounter (Signed)
Dr. Sabra Heck, please see patient message below and advise?

## 2016-01-16 NOTE — Telephone Encounter (Signed)
Patient called to let Dr. Sabra Heck know she recently had an MRI of her hip that also showed 37mm cyst on her left ovary. She said the results are in MyChart if Dr. Sabra Heck wants to review them.

## 2016-01-16 NOTE — Progress Notes (Signed)
Office Visit Note   Patient: Tara Fernandez           Date of Birth: 02-13-1950           MRN: [id number] Visit Date: 01/16/2016              Requested by: No referring provider defined for this encounter. PCP: No PCP Per Patient   Assessment & Plan: Visit Diagnoses:  1. Trochanteric bursitis, left hip     Plan: After reviewing the results of the MRI scan with Tara Fernandez and discussing with her the fact that she does get some relief with trochanteric injections with diminishing results each injection. Would recommend left hip trochanteric bursectomy. Surgery and recovery were discussed with the patient at length by Dr. Ninfa Linden and myself. She understands she could have worsening pain prolonged pain despite surgery and that this may not alleviate all of her pain. She would like to think about this with given her [employer] billing's card she will call if she wants to set up the bursectomy. She would like to have this done under spinal anesthesia for this and have to be done at the hospital versus an outpatient setting she would need to stay overnight.  Follow-Up Instructions: No Follow-up on file.   Orders:  No orders of the defined types were placed in this encounter.  No orders of the defined types were placed in this encounter.     Procedures: No procedures performed   Clinical Data: No additional findings.   Subjective: Chief Complaint  Patient presents with  . Left Hip - Follow-up    HPI Tara Fernandez returns today follow-up of her left hip MRI was performed on 01/06/2016. States she still continues to have pain anterior aspect of the hip. Again she had a left total hip arthroplasty in 2014. He has had chronic hip pain is responded well with decreasing efficacy to trochanteric injections. She's also had formal physical therapy for this and despite the conservative measures continues to have pain. Review of Systems   Objective: Vital Signs: LMP 01/07/1996 (Approximate)    Physical Exam  Ortho Exam  Specialty Comments:  No specialty comments available.  Imaging: MRI left hip dated 01/06/2016 showed slight inflammation of the left greater trochanteric bursa and adjacent distal gluteus minimus tendon. No joint effusion no signs of  iliopsoas insertional tendinitis  PMFS History: Patient Active Problem List   Diagnosis Date Noted  . Chondromalacia of right patellofemoral joint 05/18/2013  . Leg length discrepancy 04/15/2013  . Arthritis of right hip 02/11/2013  . Status post THR (total hip replacement) 02/11/2013  . Degenerative arthritis of hip 06/17/2012   Past Medical History:  Diagnosis Date  . Arthritis    osteoarthritis; PAIN RIGHT HIP  . PONV (postoperative nausea and vomiting)   . Vaginal pessary present    PT HAS SMALL BLOOD BLISTER  - UP INSIDE VAGINA - PT'S GYN DOCTOR- DR LATHROP- AWARE -TOLD TO USE PREMARIN VAGINAL CREAM.  PT DOES NOT HAVE VAGINAL BLEEDING    Family History  Problem Relation Age of Onset  . Heart attack Father   . Cancer - Lung Sister   . Breast cancer Sister 72  . Cancer - Lung Mother     Past Surgical History:  Procedure Laterality Date  . ABDOMINAL HYSTERECTOMY  03/05/15   LAPAROSCOPIC ASSISTED VAGINAL HYSTERECTOMY WITH SALPINGECTOMY  . ANTERIOR AND POSTERIOR REPAIR N/A 03/05/2015   Procedure: ANTERIOR (CYSTOCELE) REPAIR, VAULT PROLAPSE AND GRAFT;  Surgeon: Nicki Reaper  MacDiarmid, MD;  Location: [facility];  Service: Urology;  Laterality: N/A;  . BREAST MASS EXCISION     Hx: of left breast  . BREAST SURGERY    . CARTILAGE SURGERY  10 th grade   RIGHT KNEE  . CYSTOSCOPY N/A 03/05/2015   Procedure: CYSTOSCOPY;  Surgeon: Bjorn Loser, MD;  Location: [facility];  Service: Urology;  Laterality: N/A;  . LAPAROSCOPIC VAGINAL HYSTERECTOMY WITH SALPINGECTOMY Bilateral 03/05/2015   Procedure: LAPAROSCOPIC ASSISTED VAGINAL HYSTERECTOMY WITH SALPINGECTOMY;  Surgeon: Megan Salon, MD;  Location: [facility];  Service: Gynecology;   Laterality: Bilateral;  . TOTAL HIP ARTHROPLASTY Left 06/17/2012   Procedure: LEFT TOTAL HIP ARTHROPLASTY ANTERIOR APPROACH;  Surgeon: Mcarthur Rossetti, MD;  Location: [facility];  Service: Orthopedics;  Laterality: Left;  . TOTAL HIP ARTHROPLASTY Right 02/11/2013   Procedure: RIGHT TOTAL HIP ARTHROPLASTY ANTERIOR APPROACH;  Surgeon: Mcarthur Rossetti, MD;  Location: [facility];  Service: Orthopedics;  Laterality: Right;   Social History   Occupational History  . Not on file.   Social History Main Topics  . Smoking status: Never Smoker  . Smokeless tobacco: Never Used  . Alcohol use 0.0 oz/week     Comment: occasional WINE  . Drug use: No  . Sexual activity: Yes    Partners: Male    Birth control/ protection: Post-menopausal

## 2016-01-18 NOTE — Telephone Encounter (Signed)
I reviewed her pictures from her surgery.  She did not have a cyst on the left ovary.  It is unusual for one to occur.  It does not look worrisome but I would like her to have an ultrasound (PUS).

## 2016-01-21 ENCOUNTER — Telehealth: Payer: Self-pay | Admitting: Obstetrics & Gynecology

## 2016-01-21 NOTE — Telephone Encounter (Signed)
Left message to call Telia Amundson at [phone].  

## 2016-01-21 NOTE — Telephone Encounter (Signed)
Spoke with patient in regards to benefits for a recommended ultrasound. Patient is agreeable to ready to proceed with scheduling.  I advised patient the nurse she spoke with earlier today, has forwarded a message to Dr Sabra Heck to verify time frame for scheduling. Advised patient either myself or the nurse, Glorianne Manchester, will call back to schedule. Patient is agreeable.  Routing to Dr Sabra Heck  cc: Glorianne Manchester

## 2016-01-21 NOTE — Telephone Encounter (Signed)
Spoke with patient, advised as seen below per Dr. Sabra Heck. Patient states she has a new insurance and would like to know how much a PUS will cost? Advised patient at end of call I would connect her to insurance and benefits to review coverage. Patient states she has AEX coming up 02/12/16 with Dr. Sabra Heck, ok to wait and schedule after discussing at AEX? Advised ok to wait to schedule until reviewing with Dr. Sabra Heck at [facility]. Will review with Dr. Sabra Heck and return call for any additional recommendations. Patient is agreeable.   Call forwarded to [facility] and benefits.  Routing to provider for final review. Patient is agreeable to disposition. Will close encounter.

## 2016-01-22 NOTE — Telephone Encounter (Signed)
Patient returning call to schedule PUS with Dr. Sabra Heck. Patient scheduled for 01/31/16 at 3:30pm with 4pm appt with Dr. Sabra Heck to follow. Patient verbalizes understanding and is agreeable to date and time.  Order placed for PUS.  Routing to provider for final review. Patient is agreeable to disposition. Will close encounter.  Cc: Lerry Liner

## 2016-01-22 NOTE — Addendum Note (Signed)
Addended by: Burnice Logan on: 01/22/2016 09:45 AM   Modules accepted: Orders

## 2016-01-28 DIAGNOSIS — Z803 Family history of malignant neoplasm of breast: Secondary | ICD-10-CM | POA: Diagnosis not present

## 2016-01-28 DIAGNOSIS — Z1231 Encounter for screening mammogram for malignant neoplasm of breast: Secondary | ICD-10-CM | POA: Diagnosis not present

## 2016-01-30 DIAGNOSIS — R69 Illness, unspecified: Secondary | ICD-10-CM | POA: Diagnosis not present

## 2016-01-31 ENCOUNTER — Ambulatory Visit (INDEPENDENT_AMBULATORY_CARE_PROVIDER_SITE_OTHER): Payer: Medicare HMO | Admitting: Obstetrics & Gynecology

## 2016-01-31 ENCOUNTER — Ambulatory Visit (INDEPENDENT_AMBULATORY_CARE_PROVIDER_SITE_OTHER): Payer: Medicare HMO

## 2016-01-31 ENCOUNTER — Encounter: Payer: Self-pay | Admitting: Obstetrics & Gynecology

## 2016-01-31 VITALS — BP 122/80 | HR 78 | Resp 14 | Ht 63.0 in | Wt 131.0 lb

## 2016-01-31 DIAGNOSIS — R922 Inconclusive mammogram: Secondary | ICD-10-CM | POA: Diagnosis not present

## 2016-01-31 DIAGNOSIS — N83202 Unspecified ovarian cyst, left side: Secondary | ICD-10-CM | POA: Diagnosis not present

## 2016-01-31 DIAGNOSIS — N6001 Solitary cyst of right breast: Secondary | ICD-10-CM | POA: Diagnosis not present

## 2016-01-31 NOTE — Progress Notes (Signed)
66 y.o. G42P2002 Married Caucasian female here for pelvic ultrasound due to left ovarian cyst that was noted on recent MRI.  Pt denies any pelvic pain.  Pt had hysterectomy and anterior repair with graft placed 2/17.  She is doing very well from this standpoint.  Pt's pictures from surgery were reviewed and no ovarian cysts were noted.  Recommended follow up with ultrasound.     Patient's last menstrual period was 01/07/1996 (approximate).  Contraception: Hysterectomy  Findings:  UTERUS: surgically absent EMS: n/a ADNEXA: Left ovary: cannot fully visualize due to overlying bowel gas       Right ovary: 1.0 x 0.8 x 0.4cm CUL DE SAC: no free fluid  Discussion:  Findings reviewed with pt.  I cannot completely confirm the presence or absence of a left ovarian cyst.  However, there is no significant finding on ultrasound and I do not think additional follow up is necessary.  Pt comfortable with this plan.  Assessment:  Left ovarian cyst noted on recent MRI, not confirmed on ultrasound today  Plan:  Will follow up with pt for AEX in about six weeks.  Plan repeat PUS on changes on physical exam or new symptoms.  Pt very comfortable with plan.  ~15 minutes spent with patient >50% of time was in face to face discussion of above.

## 2016-02-11 NOTE — Progress Notes (Deleted)
66 y.o. [id number] Married{Race/ethnicity:[id number]}F here for annual exam.    Patient's last menstrual period was 01/07/1996 (approximate).          Sexually active: {yes no:314532}  The current method of family planning is {contraception:315051}.    Exercising: {yes no:314532}  {types:19826} Smoker:  {YES P5382123  Health Maintenance: Pap:  02/23/15 negative, 10/15/12 negative, HR HPV negative  History of abnormal Pap:  {YES NO:22349} MMG:  02/05/15 BIRADS 2 benign  Colonoscopy:  *** BMD:   *** TDaP:  *** Pneumonia vaccine(s):  *** Zostavax:   *** Hep C testing: *** Screening Labs: ***, Hb today: ***, Urine today: ***   reports that she has never smoked. She has never used smokeless tobacco. She reports that she drinks alcohol. She reports that she does not use drugs.  Past Medical History:  Diagnosis Date  . Arthritis    osteoarthritis; PAIN RIGHT HIP  . PONV (postoperative nausea and vomiting)   . Vaginal pessary present    PT HAS SMALL BLOOD BLISTER  - UP INSIDE VAGINA - PT'S GYN DOCTOR- DR LATHROP- AWARE -TOLD TO USE PREMARIN VAGINAL CREAM.  PT DOES NOT HAVE VAGINAL BLEEDING    Past Surgical History:  Procedure Laterality Date  . ABDOMINAL HYSTERECTOMY  03/05/15   LAPAROSCOPIC ASSISTED VAGINAL HYSTERECTOMY WITH SALPINGECTOMY  . ANTERIOR AND POSTERIOR REPAIR N/A 03/05/2015   Procedure: ANTERIOR (CYSTOCELE) REPAIR, VAULT PROLAPSE AND GRAFT;  Surgeon: Bjorn Loser, MD;  Location: [facility];  Service: Urology;  Laterality: N/A;  . BREAST MASS EXCISION     Hx: of left breast  . BREAST SURGERY    . CARTILAGE SURGERY  10 th grade   RIGHT KNEE  . CYSTOSCOPY N/A 03/05/2015   Procedure: CYSTOSCOPY;  Surgeon: Bjorn Loser, MD;  Location: [facility];  Service: Urology;  Laterality: N/A;  . LAPAROSCOPIC VAGINAL HYSTERECTOMY WITH SALPINGECTOMY Bilateral 03/05/2015   Procedure: LAPAROSCOPIC ASSISTED VAGINAL HYSTERECTOMY WITH SALPINGECTOMY;  Surgeon: Megan Salon, MD;  Location: [facility];   Service: Gynecology;  Laterality: Bilateral;  . TOTAL HIP ARTHROPLASTY Left 06/17/2012   Procedure: LEFT TOTAL HIP ARTHROPLASTY ANTERIOR APPROACH;  Surgeon: Mcarthur Rossetti, MD;  Location: [facility];  Service: Orthopedics;  Laterality: Left;  . TOTAL HIP ARTHROPLASTY Right 02/11/2013   Procedure: RIGHT TOTAL HIP ARTHROPLASTY ANTERIOR APPROACH;  Surgeon: Mcarthur Rossetti, MD;  Location: [facility];  Service: Orthopedics;  Laterality: Right;    Current Outpatient Prescriptions  Medication Sig Dispense Refill  . ibuprofen (ADVIL,MOTRIN) 800 MG tablet Take 1 tablet (800 mg total) by mouth every 8 (eight) hours as needed. 30 tablet 0   No current facility-administered medications for this visit.     Family History  Problem Relation Age of Onset  . Heart attack Father   . Cancer - Lung Sister   . Breast cancer Sister 72  . Cancer - Lung Mother     ROS:  Pertinent items are noted in HPI.  Otherwise, a comprehensive ROS was negative.  Exam:   LMP 01/07/1996 (Approximate)   Weight change: @WEIGHTCHANGE @ Height:      Ht Readings from Last 3 Encounters:  01/31/16 5\' 3"  (1.6 m)  04/03/15 5\' 3"  (1.6 m)  03/15/15 5\' 3"  (1.6 m)    General appearance: alert, cooperative and appears stated age Head: Normocephalic, without obvious abnormality, atraumatic Neck: no adenopathy, supple, symmetrical, trachea midline and thyroid {EXAM; THYROID:18604} Lungs: clear to auscultation bilaterally Breasts: {Exam; breast:13139::"normal appearance, no masses or tenderness"} Heart: regular rate and rhythm  Abdomen: soft, non-tender; bowel sounds normal; no masses,  no organomegaly Extremities: extremities normal, atraumatic, no cyanosis or edema Skin: Skin color, texture, turgor normal. No rashes or lesions Lymph nodes: Cervical, supraclavicular, and axillary nodes normal. No abnormal inguinal nodes palpated Neurologic: Grossly normal   Pelvic: External genitalia:  no lesions              Urethra:   normal appearing urethra with no masses, tenderness or lesions              Bartholins and Skenes: normal                 Vagina: normal appearing vagina with normal color and discharge, no lesions              Cervix: {exam; cervix:14595}              Pap taken: {yes no:314532} Bimanual Exam:  Uterus:  {exam; uterus:12215}              Adnexa: {exam; adnexa:12223}               Rectovaginal: Confirms               Anus:  normal sphincter tone, no lesions  Chaperone was present for exam.  A:  Well Woman with normal exam  P:   {plan; gyn:5269::"mammogram","pap smear","return annually or prn"}

## 2016-02-12 ENCOUNTER — Ambulatory Visit: Payer: BLUE CROSS/BLUE SHIELD | Admitting: Obstetrics & Gynecology

## 2016-02-14 ENCOUNTER — Encounter: Payer: Self-pay | Admitting: Obstetrics & Gynecology

## 2016-02-19 ENCOUNTER — Encounter: Payer: Self-pay | Admitting: Obstetrics & Gynecology

## 2016-02-19 ENCOUNTER — Ambulatory Visit (INDEPENDENT_AMBULATORY_CARE_PROVIDER_SITE_OTHER): Payer: Medicare HMO | Admitting: Obstetrics & Gynecology

## 2016-02-19 VITALS — BP 122/68 | HR 72 | Resp 14 | Ht 63.0 in | Wt 131.8 lb

## 2016-02-19 DIAGNOSIS — E038 Other specified hypothyroidism: Secondary | ICD-10-CM | POA: Diagnosis not present

## 2016-02-19 DIAGNOSIS — Z01419 Encounter for gynecological examination (general) (routine) without abnormal findings: Secondary | ICD-10-CM

## 2016-02-19 NOTE — Progress Notes (Signed)
66 y.o. [id number] MarriedCaucasianF here for annual exam.  Doing well.  No vaginal bleeding.    PCP:  Dr. Rogelia Mire.  Had appt end of December.  Thyroid was abnormal but she was told to have it tested again   Patient's last menstrual period was 01/07/1996 (approximate).          Sexually active: Yes.    The current method of family planning is post menopausal status.    Exercising: Yes.    walking, yoga Smoker:  no  Health Maintenance: Pap:  02/23/15 negative, 10/15/12 negative, HP HPV negative  History of abnormal Pap:  yes MMG:  01/31/16 [place]- BIRADS 2 benign  Colonoscopy:  Never, PCP office has ordered the Cologuard BMD:   never TDaP:  Unsure  Pneumonia vaccine(s):  never Zostavax:   never Hep C testing: 03/05/15 negative- lab report scanned in [place]: PCP, Hb today: PCP, Urine today: PCP   reports that she has never smoked. She has never used smokeless tobacco. She reports that she drinks alcohol. She reports that she does not use drugs.  Past Medical History:  Diagnosis Date  . Arthritis    osteoarthritis; PAIN RIGHT HIP  . PONV (postoperative nausea and vomiting)   . Vaginal pessary present    PT HAS SMALL BLOOD BLISTER  - UP INSIDE VAGINA - PT'S GYN DOCTOR- DR LATHROP- AWARE -TOLD TO USE PREMARIN VAGINAL CREAM.  PT DOES NOT HAVE VAGINAL BLEEDING    Past Surgical History:  Procedure Laterality Date  . ABDOMINAL HYSTERECTOMY  03/05/15   LAPAROSCOPIC ASSISTED VAGINAL HYSTERECTOMY WITH SALPINGECTOMY  . ANTERIOR AND POSTERIOR REPAIR N/A 03/05/2015   Procedure: ANTERIOR (CYSTOCELE) REPAIR, VAULT PROLAPSE AND GRAFT;  Surgeon: Bjorn Loser, MD;  Location: [facility];  Service: Urology;  Laterality: N/A;  . BREAST MASS EXCISION     Hx: of left breast  . BREAST SURGERY    . CARTILAGE SURGERY  10 th grade   RIGHT KNEE  . CYSTOSCOPY N/A 03/05/2015   Procedure: CYSTOSCOPY;  Surgeon: Bjorn Loser, MD;  Location: [facility];  Service: Urology;  Laterality: N/A;  .  LAPAROSCOPIC VAGINAL HYSTERECTOMY WITH SALPINGECTOMY Bilateral 03/05/2015   Procedure: LAPAROSCOPIC ASSISTED VAGINAL HYSTERECTOMY WITH SALPINGECTOMY;  Surgeon: Megan Salon, MD;  Location: [facility];  Service: Gynecology;  Laterality: Bilateral;  . TOTAL HIP ARTHROPLASTY Left 06/17/2012   Procedure: LEFT TOTAL HIP ARTHROPLASTY ANTERIOR APPROACH;  Surgeon: Mcarthur Rossetti, MD;  Location: [facility];  Service: Orthopedics;  Laterality: Left;  . TOTAL HIP ARTHROPLASTY Right 02/11/2013   Procedure: RIGHT TOTAL HIP ARTHROPLASTY ANTERIOR APPROACH;  Surgeon: Mcarthur Rossetti, MD;  Location: [facility];  Service: Orthopedics;  Laterality: Right;    Current Outpatient Prescriptions  Medication Sig Dispense Refill  . ibuprofen (ADVIL,MOTRIN) 800 MG tablet Take 1 tablet (800 mg total) by mouth every 8 (eight) hours as needed. 30 tablet 0   No current facility-administered medications for this visit.     Family History  Problem Relation Age of Onset  . Heart attack Father   . Cancer - Lung Sister   . Breast cancer Sister 6  . Cancer - Lung Mother     ROS:  Pertinent items are noted in HPI.  Otherwise, a comprehensive ROS was negative.  Exam:   BP 122/68 (BP Location: Right Arm, Patient Position: Sitting, Cuff Size: Normal)   Pulse 72   Resp 14   Ht 5\' 3"  (1.6 m)   Wt 131 lb 12.8 oz (59.8  kg)   LMP 01/07/1996 (Approximate)   BMI 23.35 kg/m   Weight change: +8#   Height: 5\' 3"  (160 cm)  Ht Readings from Last 3 Encounters:  02/19/16 5\' 3"  (1.6 m)  01/31/16 5\' 3"  (1.6 m)  04/03/15 5\' 3"  (1.6 m)   General appearance: alert, cooperative and appears stated age Head: Normocephalic, without obvious abnormality, atraumatic Neck: no adenopathy, supple, symmetrical, trachea midline and thyroid normal to inspection and palpation Lungs: clear to auscultation bilaterally Breasts: normal appearance, no masses or tenderness Heart: regular rate and rhythm Abdomen: soft, non-tender; bowel sounds normal;  no masses,  no organomegaly Extremities: extremities normal, atraumatic, no cyanosis or edema Skin: Skin color, texture, turgor normal. No rashes or lesions Lymph nodes: Cervical, supraclavicular, and axillary nodes normal. No abnormal inguinal nodes palpated Neurologic: Grossly normal   Pelvic: External genitalia:  no lesions              Urethra:  normal appearing urethra with no masses, tenderness or lesions              Bartholins and Skenes: normal                 Vagina: normal appearing vagina with normal color and discharge, no lesions              Cervix: absent              Pap taken: No. Bimanual Exam:  Uterus:  uterus absent              Adnexa: no mass, fullness, tenderness               Rectovaginal: Confirms               Anus:  normal sphincter tone, no lesions  Chaperone was present for exam.  A:  Well Woman with normal exam S/p LAVH, bilateral salpingectomy with cystocele repair with graft and vault suspension with cystoscopy with Dr. Matilde Sprang 2/17 Weight gain Hypothyroidism  P:   Mammogram guidelines reviewed pap smear not indicated TSH will be obtained and release of records for recent labs with Dr. Rogelia Mire will be obtained Lab work and vaccines with Dr. Gae Bon.  She is going to check about her last tetanus shot to be sure UTD return annually or prn

## 2016-02-20 LAB — TSH: TSH: 2.64 mIU/L

## 2016-02-28 ENCOUNTER — Telehealth: Payer: Self-pay | Admitting: *Deleted

## 2016-02-28 NOTE — Telephone Encounter (Signed)
Patient notified that TSH result is now in a normal range compared to 11/2015 blood work with Dr. Maudie Mercury. Patient verbalized understanding. Patient also aware if weight does not improve, will need a recheck with Dr. Sabra Heck in 3 months.    Routing to provider for final review. Patient agreeable to disposition. Will close encounter.

## 2016-03-12 DIAGNOSIS — M5442 Lumbago with sciatica, left side: Secondary | ICD-10-CM | POA: Diagnosis not present

## 2016-03-12 DIAGNOSIS — M5136 Other intervertebral disc degeneration, lumbar region: Secondary | ICD-10-CM | POA: Diagnosis not present

## 2016-03-12 DIAGNOSIS — M47816 Spondylosis without myelopathy or radiculopathy, lumbar region: Secondary | ICD-10-CM | POA: Diagnosis not present

## 2016-03-12 DIAGNOSIS — M9903 Segmental and somatic dysfunction of lumbar region: Secondary | ICD-10-CM | POA: Diagnosis not present

## 2016-03-13 DIAGNOSIS — M47816 Spondylosis without myelopathy or radiculopathy, lumbar region: Secondary | ICD-10-CM | POA: Diagnosis not present

## 2016-03-13 DIAGNOSIS — M9903 Segmental and somatic dysfunction of lumbar region: Secondary | ICD-10-CM | POA: Diagnosis not present

## 2016-03-13 DIAGNOSIS — M5136 Other intervertebral disc degeneration, lumbar region: Secondary | ICD-10-CM | POA: Diagnosis not present

## 2016-03-13 DIAGNOSIS — M5442 Lumbago with sciatica, left side: Secondary | ICD-10-CM | POA: Diagnosis not present

## 2016-03-18 DIAGNOSIS — M47816 Spondylosis without myelopathy or radiculopathy, lumbar region: Secondary | ICD-10-CM | POA: Diagnosis not present

## 2016-03-18 DIAGNOSIS — M9903 Segmental and somatic dysfunction of lumbar region: Secondary | ICD-10-CM | POA: Diagnosis not present

## 2016-03-18 DIAGNOSIS — M5136 Other intervertebral disc degeneration, lumbar region: Secondary | ICD-10-CM | POA: Diagnosis not present

## 2016-03-18 DIAGNOSIS — M5442 Lumbago with sciatica, left side: Secondary | ICD-10-CM | POA: Diagnosis not present

## 2016-03-20 DIAGNOSIS — M47816 Spondylosis without myelopathy or radiculopathy, lumbar region: Secondary | ICD-10-CM | POA: Diagnosis not present

## 2016-03-20 DIAGNOSIS — M9903 Segmental and somatic dysfunction of lumbar region: Secondary | ICD-10-CM | POA: Diagnosis not present

## 2016-03-20 DIAGNOSIS — M5136 Other intervertebral disc degeneration, lumbar region: Secondary | ICD-10-CM | POA: Diagnosis not present

## 2016-03-20 DIAGNOSIS — M5442 Lumbago with sciatica, left side: Secondary | ICD-10-CM | POA: Diagnosis not present

## 2016-03-24 DIAGNOSIS — M5136 Other intervertebral disc degeneration, lumbar region: Secondary | ICD-10-CM | POA: Diagnosis not present

## 2016-03-24 DIAGNOSIS — M47816 Spondylosis without myelopathy or radiculopathy, lumbar region: Secondary | ICD-10-CM | POA: Diagnosis not present

## 2016-03-24 DIAGNOSIS — M9903 Segmental and somatic dysfunction of lumbar region: Secondary | ICD-10-CM | POA: Diagnosis not present

## 2016-03-24 DIAGNOSIS — M5442 Lumbago with sciatica, left side: Secondary | ICD-10-CM | POA: Diagnosis not present

## 2016-03-25 DIAGNOSIS — M5442 Lumbago with sciatica, left side: Secondary | ICD-10-CM | POA: Diagnosis not present

## 2016-03-25 DIAGNOSIS — M9903 Segmental and somatic dysfunction of lumbar region: Secondary | ICD-10-CM | POA: Diagnosis not present

## 2016-03-25 DIAGNOSIS — M5136 Other intervertebral disc degeneration, lumbar region: Secondary | ICD-10-CM | POA: Diagnosis not present

## 2016-03-25 DIAGNOSIS — M47816 Spondylosis without myelopathy or radiculopathy, lumbar region: Secondary | ICD-10-CM | POA: Diagnosis not present

## 2016-03-27 DIAGNOSIS — M5442 Lumbago with sciatica, left side: Secondary | ICD-10-CM | POA: Diagnosis not present

## 2016-03-27 DIAGNOSIS — M5136 Other intervertebral disc degeneration, lumbar region: Secondary | ICD-10-CM | POA: Diagnosis not present

## 2016-03-27 DIAGNOSIS — M9903 Segmental and somatic dysfunction of lumbar region: Secondary | ICD-10-CM | POA: Diagnosis not present

## 2016-03-27 DIAGNOSIS — M47816 Spondylosis without myelopathy or radiculopathy, lumbar region: Secondary | ICD-10-CM | POA: Diagnosis not present

## 2016-04-01 DIAGNOSIS — M47816 Spondylosis without myelopathy or radiculopathy, lumbar region: Secondary | ICD-10-CM | POA: Diagnosis not present

## 2016-04-01 DIAGNOSIS — M5442 Lumbago with sciatica, left side: Secondary | ICD-10-CM | POA: Diagnosis not present

## 2016-04-01 DIAGNOSIS — M9903 Segmental and somatic dysfunction of lumbar region: Secondary | ICD-10-CM | POA: Diagnosis not present

## 2016-04-01 DIAGNOSIS — M5136 Other intervertebral disc degeneration, lumbar region: Secondary | ICD-10-CM | POA: Diagnosis not present

## 2016-04-02 DIAGNOSIS — M9903 Segmental and somatic dysfunction of lumbar region: Secondary | ICD-10-CM | POA: Diagnosis not present

## 2016-04-02 DIAGNOSIS — M47816 Spondylosis without myelopathy or radiculopathy, lumbar region: Secondary | ICD-10-CM | POA: Diagnosis not present

## 2016-04-02 DIAGNOSIS — M5136 Other intervertebral disc degeneration, lumbar region: Secondary | ICD-10-CM | POA: Diagnosis not present

## 2016-04-02 DIAGNOSIS — M5442 Lumbago with sciatica, left side: Secondary | ICD-10-CM | POA: Diagnosis not present

## 2016-04-07 DIAGNOSIS — M9903 Segmental and somatic dysfunction of lumbar region: Secondary | ICD-10-CM | POA: Diagnosis not present

## 2016-04-07 DIAGNOSIS — M47816 Spondylosis without myelopathy or radiculopathy, lumbar region: Secondary | ICD-10-CM | POA: Diagnosis not present

## 2016-04-07 DIAGNOSIS — M5442 Lumbago with sciatica, left side: Secondary | ICD-10-CM | POA: Diagnosis not present

## 2016-04-07 DIAGNOSIS — M5136 Other intervertebral disc degeneration, lumbar region: Secondary | ICD-10-CM | POA: Diagnosis not present

## 2016-04-08 DIAGNOSIS — M5136 Other intervertebral disc degeneration, lumbar region: Secondary | ICD-10-CM | POA: Diagnosis not present

## 2016-04-08 DIAGNOSIS — M5442 Lumbago with sciatica, left side: Secondary | ICD-10-CM | POA: Diagnosis not present

## 2016-04-08 DIAGNOSIS — M9903 Segmental and somatic dysfunction of lumbar region: Secondary | ICD-10-CM | POA: Diagnosis not present

## 2016-04-08 DIAGNOSIS — M47816 Spondylosis without myelopathy or radiculopathy, lumbar region: Secondary | ICD-10-CM | POA: Diagnosis not present

## 2016-04-15 DIAGNOSIS — M9903 Segmental and somatic dysfunction of lumbar region: Secondary | ICD-10-CM | POA: Diagnosis not present

## 2016-04-15 DIAGNOSIS — M5442 Lumbago with sciatica, left side: Secondary | ICD-10-CM | POA: Diagnosis not present

## 2016-04-15 DIAGNOSIS — M47816 Spondylosis without myelopathy or radiculopathy, lumbar region: Secondary | ICD-10-CM | POA: Diagnosis not present

## 2016-04-15 DIAGNOSIS — M5136 Other intervertebral disc degeneration, lumbar region: Secondary | ICD-10-CM | POA: Diagnosis not present

## 2016-04-16 DIAGNOSIS — M5136 Other intervertebral disc degeneration, lumbar region: Secondary | ICD-10-CM | POA: Diagnosis not present

## 2016-04-16 DIAGNOSIS — M9903 Segmental and somatic dysfunction of lumbar region: Secondary | ICD-10-CM | POA: Diagnosis not present

## 2016-04-16 DIAGNOSIS — M5442 Lumbago with sciatica, left side: Secondary | ICD-10-CM | POA: Diagnosis not present

## 2016-04-16 DIAGNOSIS — M47816 Spondylosis without myelopathy or radiculopathy, lumbar region: Secondary | ICD-10-CM | POA: Diagnosis not present

## 2016-04-17 DIAGNOSIS — M5442 Lumbago with sciatica, left side: Secondary | ICD-10-CM | POA: Diagnosis not present

## 2016-04-17 DIAGNOSIS — M9903 Segmental and somatic dysfunction of lumbar region: Secondary | ICD-10-CM | POA: Diagnosis not present

## 2016-04-17 DIAGNOSIS — M47816 Spondylosis without myelopathy or radiculopathy, lumbar region: Secondary | ICD-10-CM | POA: Diagnosis not present

## 2016-04-17 DIAGNOSIS — M5136 Other intervertebral disc degeneration, lumbar region: Secondary | ICD-10-CM | POA: Diagnosis not present

## 2016-04-21 DIAGNOSIS — M47816 Spondylosis without myelopathy or radiculopathy, lumbar region: Secondary | ICD-10-CM | POA: Diagnosis not present

## 2016-04-21 DIAGNOSIS — M5136 Other intervertebral disc degeneration, lumbar region: Secondary | ICD-10-CM | POA: Diagnosis not present

## 2016-04-21 DIAGNOSIS — M5442 Lumbago with sciatica, left side: Secondary | ICD-10-CM | POA: Diagnosis not present

## 2016-04-21 DIAGNOSIS — M9903 Segmental and somatic dysfunction of lumbar region: Secondary | ICD-10-CM | POA: Diagnosis not present

## 2016-04-22 DIAGNOSIS — M5442 Lumbago with sciatica, left side: Secondary | ICD-10-CM | POA: Diagnosis not present

## 2016-04-22 DIAGNOSIS — M47816 Spondylosis without myelopathy or radiculopathy, lumbar region: Secondary | ICD-10-CM | POA: Diagnosis not present

## 2016-04-22 DIAGNOSIS — M5136 Other intervertebral disc degeneration, lumbar region: Secondary | ICD-10-CM | POA: Diagnosis not present

## 2016-04-22 DIAGNOSIS — M9903 Segmental and somatic dysfunction of lumbar region: Secondary | ICD-10-CM | POA: Diagnosis not present

## 2016-04-24 DIAGNOSIS — M5136 Other intervertebral disc degeneration, lumbar region: Secondary | ICD-10-CM | POA: Diagnosis not present

## 2016-04-24 DIAGNOSIS — M47816 Spondylosis without myelopathy or radiculopathy, lumbar region: Secondary | ICD-10-CM | POA: Diagnosis not present

## 2016-04-24 DIAGNOSIS — M5442 Lumbago with sciatica, left side: Secondary | ICD-10-CM | POA: Diagnosis not present

## 2016-04-24 DIAGNOSIS — M9903 Segmental and somatic dysfunction of lumbar region: Secondary | ICD-10-CM | POA: Diagnosis not present

## 2016-04-28 DIAGNOSIS — M5136 Other intervertebral disc degeneration, lumbar region: Secondary | ICD-10-CM | POA: Diagnosis not present

## 2016-04-28 DIAGNOSIS — M9903 Segmental and somatic dysfunction of lumbar region: Secondary | ICD-10-CM | POA: Diagnosis not present

## 2016-04-28 DIAGNOSIS — M47816 Spondylosis without myelopathy or radiculopathy, lumbar region: Secondary | ICD-10-CM | POA: Diagnosis not present

## 2016-04-28 DIAGNOSIS — M5442 Lumbago with sciatica, left side: Secondary | ICD-10-CM | POA: Diagnosis not present

## 2016-04-29 DIAGNOSIS — M9903 Segmental and somatic dysfunction of lumbar region: Secondary | ICD-10-CM | POA: Diagnosis not present

## 2016-04-29 DIAGNOSIS — M5442 Lumbago with sciatica, left side: Secondary | ICD-10-CM | POA: Diagnosis not present

## 2016-04-29 DIAGNOSIS — M5136 Other intervertebral disc degeneration, lumbar region: Secondary | ICD-10-CM | POA: Diagnosis not present

## 2016-04-29 DIAGNOSIS — M47816 Spondylosis without myelopathy or radiculopathy, lumbar region: Secondary | ICD-10-CM | POA: Diagnosis not present

## 2016-05-05 DIAGNOSIS — M5442 Lumbago with sciatica, left side: Secondary | ICD-10-CM | POA: Diagnosis not present

## 2016-05-05 DIAGNOSIS — M9903 Segmental and somatic dysfunction of lumbar region: Secondary | ICD-10-CM | POA: Diagnosis not present

## 2016-05-05 DIAGNOSIS — M5136 Other intervertebral disc degeneration, lumbar region: Secondary | ICD-10-CM | POA: Diagnosis not present

## 2016-05-05 DIAGNOSIS — M47816 Spondylosis without myelopathy or radiculopathy, lumbar region: Secondary | ICD-10-CM | POA: Diagnosis not present

## 2016-05-06 DIAGNOSIS — M9903 Segmental and somatic dysfunction of lumbar region: Secondary | ICD-10-CM | POA: Diagnosis not present

## 2016-05-06 DIAGNOSIS — M5442 Lumbago with sciatica, left side: Secondary | ICD-10-CM | POA: Diagnosis not present

## 2016-05-06 DIAGNOSIS — M5136 Other intervertebral disc degeneration, lumbar region: Secondary | ICD-10-CM | POA: Diagnosis not present

## 2016-05-06 DIAGNOSIS — M47816 Spondylosis without myelopathy or radiculopathy, lumbar region: Secondary | ICD-10-CM | POA: Diagnosis not present

## 2016-05-07 DIAGNOSIS — M5136 Other intervertebral disc degeneration, lumbar region: Secondary | ICD-10-CM | POA: Diagnosis not present

## 2016-05-07 DIAGNOSIS — M9903 Segmental and somatic dysfunction of lumbar region: Secondary | ICD-10-CM | POA: Diagnosis not present

## 2016-05-07 DIAGNOSIS — M5442 Lumbago with sciatica, left side: Secondary | ICD-10-CM | POA: Diagnosis not present

## 2016-05-07 DIAGNOSIS — M47816 Spondylosis without myelopathy or radiculopathy, lumbar region: Secondary | ICD-10-CM | POA: Diagnosis not present

## 2016-05-08 DIAGNOSIS — M5442 Lumbago with sciatica, left side: Secondary | ICD-10-CM | POA: Diagnosis not present

## 2016-05-08 DIAGNOSIS — M47816 Spondylosis without myelopathy or radiculopathy, lumbar region: Secondary | ICD-10-CM | POA: Diagnosis not present

## 2016-05-08 DIAGNOSIS — M9903 Segmental and somatic dysfunction of lumbar region: Secondary | ICD-10-CM | POA: Diagnosis not present

## 2016-05-08 DIAGNOSIS — M5136 Other intervertebral disc degeneration, lumbar region: Secondary | ICD-10-CM | POA: Diagnosis not present

## 2016-05-12 DIAGNOSIS — M5136 Other intervertebral disc degeneration, lumbar region: Secondary | ICD-10-CM | POA: Diagnosis not present

## 2016-05-12 DIAGNOSIS — M5442 Lumbago with sciatica, left side: Secondary | ICD-10-CM | POA: Diagnosis not present

## 2016-05-12 DIAGNOSIS — M47816 Spondylosis without myelopathy or radiculopathy, lumbar region: Secondary | ICD-10-CM | POA: Diagnosis not present

## 2016-05-12 DIAGNOSIS — M9903 Segmental and somatic dysfunction of lumbar region: Secondary | ICD-10-CM | POA: Diagnosis not present

## 2016-05-14 DIAGNOSIS — M5442 Lumbago with sciatica, left side: Secondary | ICD-10-CM | POA: Diagnosis not present

## 2016-05-14 DIAGNOSIS — M9903 Segmental and somatic dysfunction of lumbar region: Secondary | ICD-10-CM | POA: Diagnosis not present

## 2016-05-14 DIAGNOSIS — M47816 Spondylosis without myelopathy or radiculopathy, lumbar region: Secondary | ICD-10-CM | POA: Diagnosis not present

## 2016-05-14 DIAGNOSIS — M5136 Other intervertebral disc degeneration, lumbar region: Secondary | ICD-10-CM | POA: Diagnosis not present

## 2016-06-03 DIAGNOSIS — M5442 Lumbago with sciatica, left side: Secondary | ICD-10-CM | POA: Diagnosis not present

## 2016-06-03 DIAGNOSIS — M9903 Segmental and somatic dysfunction of lumbar region: Secondary | ICD-10-CM | POA: Diagnosis not present

## 2016-06-03 DIAGNOSIS — M5136 Other intervertebral disc degeneration, lumbar region: Secondary | ICD-10-CM | POA: Diagnosis not present

## 2016-06-03 DIAGNOSIS — M47816 Spondylosis without myelopathy or radiculopathy, lumbar region: Secondary | ICD-10-CM | POA: Diagnosis not present

## 2016-06-04 DIAGNOSIS — M9903 Segmental and somatic dysfunction of lumbar region: Secondary | ICD-10-CM | POA: Diagnosis not present

## 2016-06-04 DIAGNOSIS — M47816 Spondylosis without myelopathy or radiculopathy, lumbar region: Secondary | ICD-10-CM | POA: Diagnosis not present

## 2016-06-04 DIAGNOSIS — M5442 Lumbago with sciatica, left side: Secondary | ICD-10-CM | POA: Diagnosis not present

## 2016-06-04 DIAGNOSIS — M5136 Other intervertebral disc degeneration, lumbar region: Secondary | ICD-10-CM | POA: Diagnosis not present

## 2016-06-10 DIAGNOSIS — M5136 Other intervertebral disc degeneration, lumbar region: Secondary | ICD-10-CM | POA: Diagnosis not present

## 2016-06-10 DIAGNOSIS — M5442 Lumbago with sciatica, left side: Secondary | ICD-10-CM | POA: Diagnosis not present

## 2016-06-10 DIAGNOSIS — M47816 Spondylosis without myelopathy or radiculopathy, lumbar region: Secondary | ICD-10-CM | POA: Diagnosis not present

## 2016-06-10 DIAGNOSIS — M9903 Segmental and somatic dysfunction of lumbar region: Secondary | ICD-10-CM | POA: Diagnosis not present

## 2016-06-17 DIAGNOSIS — M5136 Other intervertebral disc degeneration, lumbar region: Secondary | ICD-10-CM | POA: Diagnosis not present

## 2016-06-17 DIAGNOSIS — M9903 Segmental and somatic dysfunction of lumbar region: Secondary | ICD-10-CM | POA: Diagnosis not present

## 2016-06-17 DIAGNOSIS — M5442 Lumbago with sciatica, left side: Secondary | ICD-10-CM | POA: Diagnosis not present

## 2016-06-17 DIAGNOSIS — M47816 Spondylosis without myelopathy or radiculopathy, lumbar region: Secondary | ICD-10-CM | POA: Diagnosis not present

## 2016-06-25 DIAGNOSIS — M5136 Other intervertebral disc degeneration, lumbar region: Secondary | ICD-10-CM | POA: Diagnosis not present

## 2016-06-25 DIAGNOSIS — M5442 Lumbago with sciatica, left side: Secondary | ICD-10-CM | POA: Diagnosis not present

## 2016-06-25 DIAGNOSIS — M9903 Segmental and somatic dysfunction of lumbar region: Secondary | ICD-10-CM | POA: Diagnosis not present

## 2016-06-25 DIAGNOSIS — M47816 Spondylosis without myelopathy or radiculopathy, lumbar region: Secondary | ICD-10-CM | POA: Diagnosis not present

## 2016-07-01 DIAGNOSIS — M47816 Spondylosis without myelopathy or radiculopathy, lumbar region: Secondary | ICD-10-CM | POA: Diagnosis not present

## 2016-07-01 DIAGNOSIS — M5442 Lumbago with sciatica, left side: Secondary | ICD-10-CM | POA: Diagnosis not present

## 2016-07-01 DIAGNOSIS — M9903 Segmental and somatic dysfunction of lumbar region: Secondary | ICD-10-CM | POA: Diagnosis not present

## 2016-07-01 DIAGNOSIS — M5136 Other intervertebral disc degeneration, lumbar region: Secondary | ICD-10-CM | POA: Diagnosis not present

## 2016-07-08 DIAGNOSIS — M5136 Other intervertebral disc degeneration, lumbar region: Secondary | ICD-10-CM | POA: Diagnosis not present

## 2016-07-08 DIAGNOSIS — M47816 Spondylosis without myelopathy or radiculopathy, lumbar region: Secondary | ICD-10-CM | POA: Diagnosis not present

## 2016-07-08 DIAGNOSIS — M9903 Segmental and somatic dysfunction of lumbar region: Secondary | ICD-10-CM | POA: Diagnosis not present

## 2016-07-08 DIAGNOSIS — M5442 Lumbago with sciatica, left side: Secondary | ICD-10-CM | POA: Diagnosis not present

## 2016-07-10 DIAGNOSIS — M9903 Segmental and somatic dysfunction of lumbar region: Secondary | ICD-10-CM | POA: Diagnosis not present

## 2016-07-10 DIAGNOSIS — M5136 Other intervertebral disc degeneration, lumbar region: Secondary | ICD-10-CM | POA: Diagnosis not present

## 2016-07-10 DIAGNOSIS — M47816 Spondylosis without myelopathy or radiculopathy, lumbar region: Secondary | ICD-10-CM | POA: Diagnosis not present

## 2016-07-10 DIAGNOSIS — M5442 Lumbago with sciatica, left side: Secondary | ICD-10-CM | POA: Diagnosis not present

## 2016-07-22 DIAGNOSIS — M5442 Lumbago with sciatica, left side: Secondary | ICD-10-CM | POA: Diagnosis not present

## 2016-07-22 DIAGNOSIS — M5136 Other intervertebral disc degeneration, lumbar region: Secondary | ICD-10-CM | POA: Diagnosis not present

## 2016-07-22 DIAGNOSIS — M47816 Spondylosis without myelopathy or radiculopathy, lumbar region: Secondary | ICD-10-CM | POA: Diagnosis not present

## 2016-07-22 DIAGNOSIS — M9903 Segmental and somatic dysfunction of lumbar region: Secondary | ICD-10-CM | POA: Diagnosis not present

## 2016-07-29 DIAGNOSIS — M5136 Other intervertebral disc degeneration, lumbar region: Secondary | ICD-10-CM | POA: Diagnosis not present

## 2016-07-29 DIAGNOSIS — M47816 Spondylosis without myelopathy or radiculopathy, lumbar region: Secondary | ICD-10-CM | POA: Diagnosis not present

## 2016-07-29 DIAGNOSIS — M5442 Lumbago with sciatica, left side: Secondary | ICD-10-CM | POA: Diagnosis not present

## 2016-07-29 DIAGNOSIS — M9903 Segmental and somatic dysfunction of lumbar region: Secondary | ICD-10-CM | POA: Diagnosis not present

## 2016-07-30 DIAGNOSIS — R69 Illness, unspecified: Secondary | ICD-10-CM | POA: Diagnosis not present

## 2016-08-04 DIAGNOSIS — M9903 Segmental and somatic dysfunction of lumbar region: Secondary | ICD-10-CM | POA: Diagnosis not present

## 2016-08-04 DIAGNOSIS — M5442 Lumbago with sciatica, left side: Secondary | ICD-10-CM | POA: Diagnosis not present

## 2016-08-04 DIAGNOSIS — M47816 Spondylosis without myelopathy or radiculopathy, lumbar region: Secondary | ICD-10-CM | POA: Diagnosis not present

## 2016-08-04 DIAGNOSIS — M5136 Other intervertebral disc degeneration, lumbar region: Secondary | ICD-10-CM | POA: Diagnosis not present

## 2016-08-20 DIAGNOSIS — M5442 Lumbago with sciatica, left side: Secondary | ICD-10-CM | POA: Diagnosis not present

## 2016-08-20 DIAGNOSIS — M47816 Spondylosis without myelopathy or radiculopathy, lumbar region: Secondary | ICD-10-CM | POA: Diagnosis not present

## 2016-08-20 DIAGNOSIS — M9903 Segmental and somatic dysfunction of lumbar region: Secondary | ICD-10-CM | POA: Diagnosis not present

## 2016-08-20 DIAGNOSIS — M5136 Other intervertebral disc degeneration, lumbar region: Secondary | ICD-10-CM | POA: Diagnosis not present

## 2016-09-02 DIAGNOSIS — M5136 Other intervertebral disc degeneration, lumbar region: Secondary | ICD-10-CM | POA: Diagnosis not present

## 2016-09-02 DIAGNOSIS — M5442 Lumbago with sciatica, left side: Secondary | ICD-10-CM | POA: Diagnosis not present

## 2016-09-02 DIAGNOSIS — M47816 Spondylosis without myelopathy or radiculopathy, lumbar region: Secondary | ICD-10-CM | POA: Diagnosis not present

## 2016-09-02 DIAGNOSIS — M9903 Segmental and somatic dysfunction of lumbar region: Secondary | ICD-10-CM | POA: Diagnosis not present

## 2016-09-16 DIAGNOSIS — M9903 Segmental and somatic dysfunction of lumbar region: Secondary | ICD-10-CM | POA: Diagnosis not present

## 2016-09-16 DIAGNOSIS — M47816 Spondylosis without myelopathy or radiculopathy, lumbar region: Secondary | ICD-10-CM | POA: Diagnosis not present

## 2016-09-16 DIAGNOSIS — M5442 Lumbago with sciatica, left side: Secondary | ICD-10-CM | POA: Diagnosis not present

## 2016-09-16 DIAGNOSIS — M5136 Other intervertebral disc degeneration, lumbar region: Secondary | ICD-10-CM | POA: Diagnosis not present

## 2016-09-30 DIAGNOSIS — M9903 Segmental and somatic dysfunction of lumbar region: Secondary | ICD-10-CM | POA: Diagnosis not present

## 2016-09-30 DIAGNOSIS — M5136 Other intervertebral disc degeneration, lumbar region: Secondary | ICD-10-CM | POA: Diagnosis not present

## 2016-09-30 DIAGNOSIS — M5442 Lumbago with sciatica, left side: Secondary | ICD-10-CM | POA: Diagnosis not present

## 2016-09-30 DIAGNOSIS — M47816 Spondylosis without myelopathy or radiculopathy, lumbar region: Secondary | ICD-10-CM | POA: Diagnosis not present

## 2016-10-13 DIAGNOSIS — M5442 Lumbago with sciatica, left side: Secondary | ICD-10-CM | POA: Diagnosis not present

## 2016-10-13 DIAGNOSIS — M5136 Other intervertebral disc degeneration, lumbar region: Secondary | ICD-10-CM | POA: Diagnosis not present

## 2016-10-13 DIAGNOSIS — M9903 Segmental and somatic dysfunction of lumbar region: Secondary | ICD-10-CM | POA: Diagnosis not present

## 2016-10-13 DIAGNOSIS — M47816 Spondylosis without myelopathy or radiculopathy, lumbar region: Secondary | ICD-10-CM | POA: Diagnosis not present

## 2016-10-22 DIAGNOSIS — M9903 Segmental and somatic dysfunction of lumbar region: Secondary | ICD-10-CM | POA: Diagnosis not present

## 2016-10-22 DIAGNOSIS — M5442 Lumbago with sciatica, left side: Secondary | ICD-10-CM | POA: Diagnosis not present

## 2016-10-22 DIAGNOSIS — M47816 Spondylosis without myelopathy or radiculopathy, lumbar region: Secondary | ICD-10-CM | POA: Diagnosis not present

## 2016-10-22 DIAGNOSIS — M5136 Other intervertebral disc degeneration, lumbar region: Secondary | ICD-10-CM | POA: Diagnosis not present

## 2016-10-27 DIAGNOSIS — M5136 Other intervertebral disc degeneration, lumbar region: Secondary | ICD-10-CM | POA: Diagnosis not present

## 2016-10-27 DIAGNOSIS — M9903 Segmental and somatic dysfunction of lumbar region: Secondary | ICD-10-CM | POA: Diagnosis not present

## 2016-10-27 DIAGNOSIS — M47816 Spondylosis without myelopathy or radiculopathy, lumbar region: Secondary | ICD-10-CM | POA: Diagnosis not present

## 2016-10-27 DIAGNOSIS — M5442 Lumbago with sciatica, left side: Secondary | ICD-10-CM | POA: Diagnosis not present

## 2016-11-10 DIAGNOSIS — M47816 Spondylosis without myelopathy or radiculopathy, lumbar region: Secondary | ICD-10-CM | POA: Diagnosis not present

## 2016-11-10 DIAGNOSIS — M9903 Segmental and somatic dysfunction of lumbar region: Secondary | ICD-10-CM | POA: Diagnosis not present

## 2016-11-10 DIAGNOSIS — M5136 Other intervertebral disc degeneration, lumbar region: Secondary | ICD-10-CM | POA: Diagnosis not present

## 2016-11-10 DIAGNOSIS — M5442 Lumbago with sciatica, left side: Secondary | ICD-10-CM | POA: Diagnosis not present

## 2016-11-19 DIAGNOSIS — M5442 Lumbago with sciatica, left side: Secondary | ICD-10-CM | POA: Diagnosis not present

## 2016-11-19 DIAGNOSIS — M9903 Segmental and somatic dysfunction of lumbar region: Secondary | ICD-10-CM | POA: Diagnosis not present

## 2016-11-19 DIAGNOSIS — M47816 Spondylosis without myelopathy or radiculopathy, lumbar region: Secondary | ICD-10-CM | POA: Diagnosis not present

## 2016-11-19 DIAGNOSIS — M5136 Other intervertebral disc degeneration, lumbar region: Secondary | ICD-10-CM | POA: Diagnosis not present

## 2016-11-20 DIAGNOSIS — M47816 Spondylosis without myelopathy or radiculopathy, lumbar region: Secondary | ICD-10-CM | POA: Diagnosis not present

## 2016-11-20 DIAGNOSIS — M5442 Lumbago with sciatica, left side: Secondary | ICD-10-CM | POA: Diagnosis not present

## 2016-11-20 DIAGNOSIS — M9903 Segmental and somatic dysfunction of lumbar region: Secondary | ICD-10-CM | POA: Diagnosis not present

## 2016-11-20 DIAGNOSIS — M5136 Other intervertebral disc degeneration, lumbar region: Secondary | ICD-10-CM | POA: Diagnosis not present

## 2016-11-24 DIAGNOSIS — M9903 Segmental and somatic dysfunction of lumbar region: Secondary | ICD-10-CM | POA: Diagnosis not present

## 2016-11-24 DIAGNOSIS — M5136 Other intervertebral disc degeneration, lumbar region: Secondary | ICD-10-CM | POA: Diagnosis not present

## 2016-11-24 DIAGNOSIS — M47816 Spondylosis without myelopathy or radiculopathy, lumbar region: Secondary | ICD-10-CM | POA: Diagnosis not present

## 2016-11-24 DIAGNOSIS — M5442 Lumbago with sciatica, left side: Secondary | ICD-10-CM | POA: Diagnosis not present

## 2016-12-08 DIAGNOSIS — Z0000 Encounter for general adult medical examination without abnormal findings: Secondary | ICD-10-CM | POA: Diagnosis not present

## 2016-12-08 DIAGNOSIS — R5383 Other fatigue: Secondary | ICD-10-CM | POA: Diagnosis not present

## 2016-12-08 DIAGNOSIS — M159 Polyosteoarthritis, unspecified: Secondary | ICD-10-CM | POA: Diagnosis not present

## 2016-12-08 DIAGNOSIS — Z780 Asymptomatic menopausal state: Secondary | ICD-10-CM | POA: Diagnosis not present

## 2016-12-08 DIAGNOSIS — Z13220 Encounter for screening for lipoid disorders: Secondary | ICD-10-CM | POA: Diagnosis not present

## 2016-12-09 DIAGNOSIS — M47816 Spondylosis without myelopathy or radiculopathy, lumbar region: Secondary | ICD-10-CM | POA: Diagnosis not present

## 2016-12-09 DIAGNOSIS — M5442 Lumbago with sciatica, left side: Secondary | ICD-10-CM | POA: Diagnosis not present

## 2016-12-09 DIAGNOSIS — M9903 Segmental and somatic dysfunction of lumbar region: Secondary | ICD-10-CM | POA: Diagnosis not present

## 2016-12-09 DIAGNOSIS — M5136 Other intervertebral disc degeneration, lumbar region: Secondary | ICD-10-CM | POA: Diagnosis not present

## 2016-12-16 DIAGNOSIS — R319 Hematuria, unspecified: Secondary | ICD-10-CM | POA: Diagnosis not present

## 2016-12-16 DIAGNOSIS — M159 Polyosteoarthritis, unspecified: Secondary | ICD-10-CM | POA: Diagnosis not present

## 2016-12-16 DIAGNOSIS — R5383 Other fatigue: Secondary | ICD-10-CM | POA: Diagnosis not present

## 2016-12-18 ENCOUNTER — Encounter: Payer: Self-pay | Admitting: Obstetrics & Gynecology

## 2016-12-18 DIAGNOSIS — Z1211 Encounter for screening for malignant neoplasm of colon: Secondary | ICD-10-CM | POA: Diagnosis not present

## 2016-12-18 DIAGNOSIS — Z1212 Encounter for screening for malignant neoplasm of rectum: Secondary | ICD-10-CM | POA: Diagnosis not present

## 2016-12-18 LAB — COLOGUARD

## 2016-12-23 DIAGNOSIS — M5136 Other intervertebral disc degeneration, lumbar region: Secondary | ICD-10-CM | POA: Diagnosis not present

## 2016-12-23 DIAGNOSIS — M47816 Spondylosis without myelopathy or radiculopathy, lumbar region: Secondary | ICD-10-CM | POA: Diagnosis not present

## 2016-12-23 DIAGNOSIS — M9903 Segmental and somatic dysfunction of lumbar region: Secondary | ICD-10-CM | POA: Diagnosis not present

## 2016-12-23 DIAGNOSIS — M5442 Lumbago with sciatica, left side: Secondary | ICD-10-CM | POA: Diagnosis not present

## 2017-01-05 DIAGNOSIS — M5442 Lumbago with sciatica, left side: Secondary | ICD-10-CM | POA: Diagnosis not present

## 2017-01-05 DIAGNOSIS — M9903 Segmental and somatic dysfunction of lumbar region: Secondary | ICD-10-CM | POA: Diagnosis not present

## 2017-01-05 DIAGNOSIS — M5136 Other intervertebral disc degeneration, lumbar region: Secondary | ICD-10-CM | POA: Diagnosis not present

## 2017-01-05 DIAGNOSIS — M47816 Spondylosis without myelopathy or radiculopathy, lumbar region: Secondary | ICD-10-CM | POA: Diagnosis not present

## 2017-01-19 DIAGNOSIS — M5136 Other intervertebral disc degeneration, lumbar region: Secondary | ICD-10-CM | POA: Diagnosis not present

## 2017-01-19 DIAGNOSIS — M5442 Lumbago with sciatica, left side: Secondary | ICD-10-CM | POA: Diagnosis not present

## 2017-01-19 DIAGNOSIS — M9903 Segmental and somatic dysfunction of lumbar region: Secondary | ICD-10-CM | POA: Diagnosis not present

## 2017-01-19 DIAGNOSIS — M47816 Spondylosis without myelopathy or radiculopathy, lumbar region: Secondary | ICD-10-CM | POA: Diagnosis not present

## 2017-01-19 DIAGNOSIS — R319 Hematuria, unspecified: Secondary | ICD-10-CM | POA: Diagnosis not present

## 2017-01-20 DIAGNOSIS — M47816 Spondylosis without myelopathy or radiculopathy, lumbar region: Secondary | ICD-10-CM | POA: Diagnosis not present

## 2017-01-20 DIAGNOSIS — M5442 Lumbago with sciatica, left side: Secondary | ICD-10-CM | POA: Diagnosis not present

## 2017-01-20 DIAGNOSIS — M9903 Segmental and somatic dysfunction of lumbar region: Secondary | ICD-10-CM | POA: Diagnosis not present

## 2017-01-20 DIAGNOSIS — M5136 Other intervertebral disc degeneration, lumbar region: Secondary | ICD-10-CM | POA: Diagnosis not present

## 2017-02-02 DIAGNOSIS — M5136 Other intervertebral disc degeneration, lumbar region: Secondary | ICD-10-CM | POA: Diagnosis not present

## 2017-02-02 DIAGNOSIS — M47816 Spondylosis without myelopathy or radiculopathy, lumbar region: Secondary | ICD-10-CM | POA: Diagnosis not present

## 2017-02-02 DIAGNOSIS — M5442 Lumbago with sciatica, left side: Secondary | ICD-10-CM | POA: Diagnosis not present

## 2017-02-02 DIAGNOSIS — M9903 Segmental and somatic dysfunction of lumbar region: Secondary | ICD-10-CM | POA: Diagnosis not present

## 2017-02-05 DIAGNOSIS — R69 Illness, unspecified: Secondary | ICD-10-CM | POA: Diagnosis not present

## 2017-02-09 DIAGNOSIS — Z1231 Encounter for screening mammogram for malignant neoplasm of breast: Secondary | ICD-10-CM | POA: Diagnosis not present

## 2017-02-09 DIAGNOSIS — Z803 Family history of malignant neoplasm of breast: Secondary | ICD-10-CM | POA: Diagnosis not present

## 2017-02-16 ENCOUNTER — Encounter: Payer: Self-pay | Admitting: Obstetrics & Gynecology

## 2017-02-16 DIAGNOSIS — M47816 Spondylosis without myelopathy or radiculopathy, lumbar region: Secondary | ICD-10-CM | POA: Diagnosis not present

## 2017-02-16 DIAGNOSIS — M5136 Other intervertebral disc degeneration, lumbar region: Secondary | ICD-10-CM | POA: Diagnosis not present

## 2017-02-16 DIAGNOSIS — M9903 Segmental and somatic dysfunction of lumbar region: Secondary | ICD-10-CM | POA: Diagnosis not present

## 2017-02-16 DIAGNOSIS — M5442 Lumbago with sciatica, left side: Secondary | ICD-10-CM | POA: Diagnosis not present

## 2017-02-26 DIAGNOSIS — M9903 Segmental and somatic dysfunction of lumbar region: Secondary | ICD-10-CM | POA: Diagnosis not present

## 2017-02-26 DIAGNOSIS — M5442 Lumbago with sciatica, left side: Secondary | ICD-10-CM | POA: Diagnosis not present

## 2017-02-26 DIAGNOSIS — M5136 Other intervertebral disc degeneration, lumbar region: Secondary | ICD-10-CM | POA: Diagnosis not present

## 2017-02-26 DIAGNOSIS — M47816 Spondylosis without myelopathy or radiculopathy, lumbar region: Secondary | ICD-10-CM | POA: Diagnosis not present

## 2017-03-03 DIAGNOSIS — M5442 Lumbago with sciatica, left side: Secondary | ICD-10-CM | POA: Diagnosis not present

## 2017-03-03 DIAGNOSIS — M5136 Other intervertebral disc degeneration, lumbar region: Secondary | ICD-10-CM | POA: Diagnosis not present

## 2017-03-03 DIAGNOSIS — M47816 Spondylosis without myelopathy or radiculopathy, lumbar region: Secondary | ICD-10-CM | POA: Diagnosis not present

## 2017-03-03 DIAGNOSIS — M9903 Segmental and somatic dysfunction of lumbar region: Secondary | ICD-10-CM | POA: Diagnosis not present

## 2017-03-09 DIAGNOSIS — R319 Hematuria, unspecified: Secondary | ICD-10-CM | POA: Diagnosis not present

## 2017-03-12 DIAGNOSIS — M5442 Lumbago with sciatica, left side: Secondary | ICD-10-CM | POA: Diagnosis not present

## 2017-03-12 DIAGNOSIS — M47816 Spondylosis without myelopathy or radiculopathy, lumbar region: Secondary | ICD-10-CM | POA: Diagnosis not present

## 2017-03-12 DIAGNOSIS — M9903 Segmental and somatic dysfunction of lumbar region: Secondary | ICD-10-CM | POA: Diagnosis not present

## 2017-03-12 DIAGNOSIS — M5136 Other intervertebral disc degeneration, lumbar region: Secondary | ICD-10-CM | POA: Diagnosis not present

## 2017-03-26 DIAGNOSIS — M5442 Lumbago with sciatica, left side: Secondary | ICD-10-CM | POA: Diagnosis not present

## 2017-03-26 DIAGNOSIS — M47816 Spondylosis without myelopathy or radiculopathy, lumbar region: Secondary | ICD-10-CM | POA: Diagnosis not present

## 2017-03-26 DIAGNOSIS — M5136 Other intervertebral disc degeneration, lumbar region: Secondary | ICD-10-CM | POA: Diagnosis not present

## 2017-03-26 DIAGNOSIS — M9903 Segmental and somatic dysfunction of lumbar region: Secondary | ICD-10-CM | POA: Diagnosis not present

## 2017-04-06 DIAGNOSIS — R319 Hematuria, unspecified: Secondary | ICD-10-CM | POA: Diagnosis not present

## 2017-04-09 DIAGNOSIS — M9903 Segmental and somatic dysfunction of lumbar region: Secondary | ICD-10-CM | POA: Diagnosis not present

## 2017-04-09 DIAGNOSIS — M5442 Lumbago with sciatica, left side: Secondary | ICD-10-CM | POA: Diagnosis not present

## 2017-04-09 DIAGNOSIS — M5136 Other intervertebral disc degeneration, lumbar region: Secondary | ICD-10-CM | POA: Diagnosis not present

## 2017-04-09 DIAGNOSIS — M47816 Spondylosis without myelopathy or radiculopathy, lumbar region: Secondary | ICD-10-CM | POA: Diagnosis not present

## 2017-04-14 DIAGNOSIS — R319 Hematuria, unspecified: Secondary | ICD-10-CM | POA: Diagnosis not present

## 2017-04-14 DIAGNOSIS — M25511 Pain in right shoulder: Secondary | ICD-10-CM | POA: Diagnosis not present

## 2017-04-14 DIAGNOSIS — R5383 Other fatigue: Secondary | ICD-10-CM | POA: Diagnosis not present

## 2017-04-14 DIAGNOSIS — Z780 Asymptomatic menopausal state: Secondary | ICD-10-CM | POA: Diagnosis not present

## 2017-04-14 DIAGNOSIS — M159 Polyosteoarthritis, unspecified: Secondary | ICD-10-CM | POA: Diagnosis not present

## 2017-04-20 DIAGNOSIS — M9903 Segmental and somatic dysfunction of lumbar region: Secondary | ICD-10-CM | POA: Diagnosis not present

## 2017-04-20 DIAGNOSIS — M5136 Other intervertebral disc degeneration, lumbar region: Secondary | ICD-10-CM | POA: Diagnosis not present

## 2017-04-20 DIAGNOSIS — M47816 Spondylosis without myelopathy or radiculopathy, lumbar region: Secondary | ICD-10-CM | POA: Diagnosis not present

## 2017-04-20 DIAGNOSIS — M5442 Lumbago with sciatica, left side: Secondary | ICD-10-CM | POA: Diagnosis not present

## 2017-04-28 DIAGNOSIS — M47816 Spondylosis without myelopathy or radiculopathy, lumbar region: Secondary | ICD-10-CM | POA: Diagnosis not present

## 2017-04-28 DIAGNOSIS — M9903 Segmental and somatic dysfunction of lumbar region: Secondary | ICD-10-CM | POA: Diagnosis not present

## 2017-04-28 DIAGNOSIS — M5136 Other intervertebral disc degeneration, lumbar region: Secondary | ICD-10-CM | POA: Diagnosis not present

## 2017-04-28 DIAGNOSIS — M5442 Lumbago with sciatica, left side: Secondary | ICD-10-CM | POA: Diagnosis not present

## 2017-05-01 DIAGNOSIS — M5136 Other intervertebral disc degeneration, lumbar region: Secondary | ICD-10-CM | POA: Diagnosis not present

## 2017-05-01 DIAGNOSIS — M47816 Spondylosis without myelopathy or radiculopathy, lumbar region: Secondary | ICD-10-CM | POA: Diagnosis not present

## 2017-05-01 DIAGNOSIS — M9903 Segmental and somatic dysfunction of lumbar region: Secondary | ICD-10-CM | POA: Diagnosis not present

## 2017-05-01 DIAGNOSIS — M5442 Lumbago with sciatica, left side: Secondary | ICD-10-CM | POA: Diagnosis not present

## 2017-05-08 DIAGNOSIS — M5442 Lumbago with sciatica, left side: Secondary | ICD-10-CM | POA: Diagnosis not present

## 2017-05-08 DIAGNOSIS — M9903 Segmental and somatic dysfunction of lumbar region: Secondary | ICD-10-CM | POA: Diagnosis not present

## 2017-05-08 DIAGNOSIS — M5136 Other intervertebral disc degeneration, lumbar region: Secondary | ICD-10-CM | POA: Diagnosis not present

## 2017-05-08 DIAGNOSIS — M47816 Spondylosis without myelopathy or radiculopathy, lumbar region: Secondary | ICD-10-CM | POA: Diagnosis not present

## 2017-05-12 DIAGNOSIS — M47816 Spondylosis without myelopathy or radiculopathy, lumbar region: Secondary | ICD-10-CM | POA: Diagnosis not present

## 2017-05-12 DIAGNOSIS — M5136 Other intervertebral disc degeneration, lumbar region: Secondary | ICD-10-CM | POA: Diagnosis not present

## 2017-05-12 DIAGNOSIS — M9903 Segmental and somatic dysfunction of lumbar region: Secondary | ICD-10-CM | POA: Diagnosis not present

## 2017-05-12 DIAGNOSIS — M5442 Lumbago with sciatica, left side: Secondary | ICD-10-CM | POA: Diagnosis not present

## 2017-05-25 DIAGNOSIS — M9903 Segmental and somatic dysfunction of lumbar region: Secondary | ICD-10-CM | POA: Diagnosis not present

## 2017-05-25 DIAGNOSIS — M47816 Spondylosis without myelopathy or radiculopathy, lumbar region: Secondary | ICD-10-CM | POA: Diagnosis not present

## 2017-05-25 DIAGNOSIS — M5136 Other intervertebral disc degeneration, lumbar region: Secondary | ICD-10-CM | POA: Diagnosis not present

## 2017-05-25 DIAGNOSIS — M5442 Lumbago with sciatica, left side: Secondary | ICD-10-CM | POA: Diagnosis not present

## 2017-05-28 DIAGNOSIS — M5442 Lumbago with sciatica, left side: Secondary | ICD-10-CM | POA: Diagnosis not present

## 2017-05-28 DIAGNOSIS — M9903 Segmental and somatic dysfunction of lumbar region: Secondary | ICD-10-CM | POA: Diagnosis not present

## 2017-05-28 DIAGNOSIS — M5136 Other intervertebral disc degeneration, lumbar region: Secondary | ICD-10-CM | POA: Diagnosis not present

## 2017-05-28 DIAGNOSIS — M47816 Spondylosis without myelopathy or radiculopathy, lumbar region: Secondary | ICD-10-CM | POA: Diagnosis not present

## 2017-06-15 DIAGNOSIS — M5442 Lumbago with sciatica, left side: Secondary | ICD-10-CM | POA: Diagnosis not present

## 2017-06-15 DIAGNOSIS — M5136 Other intervertebral disc degeneration, lumbar region: Secondary | ICD-10-CM | POA: Diagnosis not present

## 2017-06-15 DIAGNOSIS — M47816 Spondylosis without myelopathy or radiculopathy, lumbar region: Secondary | ICD-10-CM | POA: Diagnosis not present

## 2017-06-15 DIAGNOSIS — M9903 Segmental and somatic dysfunction of lumbar region: Secondary | ICD-10-CM | POA: Diagnosis not present

## 2017-07-02 DIAGNOSIS — M5442 Lumbago with sciatica, left side: Secondary | ICD-10-CM | POA: Diagnosis not present

## 2017-07-02 DIAGNOSIS — M47816 Spondylosis without myelopathy or radiculopathy, lumbar region: Secondary | ICD-10-CM | POA: Diagnosis not present

## 2017-07-02 DIAGNOSIS — M9903 Segmental and somatic dysfunction of lumbar region: Secondary | ICD-10-CM | POA: Diagnosis not present

## 2017-07-02 DIAGNOSIS — M5136 Other intervertebral disc degeneration, lumbar region: Secondary | ICD-10-CM | POA: Diagnosis not present

## 2017-07-16 DIAGNOSIS — M5136 Other intervertebral disc degeneration, lumbar region: Secondary | ICD-10-CM | POA: Diagnosis not present

## 2017-07-16 DIAGNOSIS — M9903 Segmental and somatic dysfunction of lumbar region: Secondary | ICD-10-CM | POA: Diagnosis not present

## 2017-07-16 DIAGNOSIS — M5442 Lumbago with sciatica, left side: Secondary | ICD-10-CM | POA: Diagnosis not present

## 2017-07-16 DIAGNOSIS — M47816 Spondylosis without myelopathy or radiculopathy, lumbar region: Secondary | ICD-10-CM | POA: Diagnosis not present

## 2017-07-27 DIAGNOSIS — M5136 Other intervertebral disc degeneration, lumbar region: Secondary | ICD-10-CM | POA: Diagnosis not present

## 2017-07-27 DIAGNOSIS — M47816 Spondylosis without myelopathy or radiculopathy, lumbar region: Secondary | ICD-10-CM | POA: Diagnosis not present

## 2017-07-27 DIAGNOSIS — M9903 Segmental and somatic dysfunction of lumbar region: Secondary | ICD-10-CM | POA: Diagnosis not present

## 2017-07-27 DIAGNOSIS — M5442 Lumbago with sciatica, left side: Secondary | ICD-10-CM | POA: Diagnosis not present

## 2017-08-04 DIAGNOSIS — M5136 Other intervertebral disc degeneration, lumbar region: Secondary | ICD-10-CM | POA: Diagnosis not present

## 2017-08-04 DIAGNOSIS — M9903 Segmental and somatic dysfunction of lumbar region: Secondary | ICD-10-CM | POA: Diagnosis not present

## 2017-08-04 DIAGNOSIS — M5442 Lumbago with sciatica, left side: Secondary | ICD-10-CM | POA: Diagnosis not present

## 2017-08-04 DIAGNOSIS — M47816 Spondylosis without myelopathy or radiculopathy, lumbar region: Secondary | ICD-10-CM | POA: Diagnosis not present

## 2017-08-10 DIAGNOSIS — M5442 Lumbago with sciatica, left side: Secondary | ICD-10-CM | POA: Diagnosis not present

## 2017-08-10 DIAGNOSIS — M5136 Other intervertebral disc degeneration, lumbar region: Secondary | ICD-10-CM | POA: Diagnosis not present

## 2017-08-10 DIAGNOSIS — M47816 Spondylosis without myelopathy or radiculopathy, lumbar region: Secondary | ICD-10-CM | POA: Diagnosis not present

## 2017-08-10 DIAGNOSIS — M9903 Segmental and somatic dysfunction of lumbar region: Secondary | ICD-10-CM | POA: Diagnosis not present

## 2017-08-11 DIAGNOSIS — R69 Illness, unspecified: Secondary | ICD-10-CM | POA: Diagnosis not present

## 2017-08-17 DIAGNOSIS — M47816 Spondylosis without myelopathy or radiculopathy, lumbar region: Secondary | ICD-10-CM | POA: Diagnosis not present

## 2017-08-17 DIAGNOSIS — M5442 Lumbago with sciatica, left side: Secondary | ICD-10-CM | POA: Diagnosis not present

## 2017-08-17 DIAGNOSIS — M9903 Segmental and somatic dysfunction of lumbar region: Secondary | ICD-10-CM | POA: Diagnosis not present

## 2017-08-17 DIAGNOSIS — M5136 Other intervertebral disc degeneration, lumbar region: Secondary | ICD-10-CM | POA: Diagnosis not present

## 2017-08-31 DIAGNOSIS — M47816 Spondylosis without myelopathy or radiculopathy, lumbar region: Secondary | ICD-10-CM | POA: Diagnosis not present

## 2017-08-31 DIAGNOSIS — M5136 Other intervertebral disc degeneration, lumbar region: Secondary | ICD-10-CM | POA: Diagnosis not present

## 2017-08-31 DIAGNOSIS — M5442 Lumbago with sciatica, left side: Secondary | ICD-10-CM | POA: Diagnosis not present

## 2017-08-31 DIAGNOSIS — M9903 Segmental and somatic dysfunction of lumbar region: Secondary | ICD-10-CM | POA: Diagnosis not present

## 2017-09-14 DIAGNOSIS — M5442 Lumbago with sciatica, left side: Secondary | ICD-10-CM | POA: Diagnosis not present

## 2017-09-14 DIAGNOSIS — M47816 Spondylosis without myelopathy or radiculopathy, lumbar region: Secondary | ICD-10-CM | POA: Diagnosis not present

## 2017-09-14 DIAGNOSIS — M5136 Other intervertebral disc degeneration, lumbar region: Secondary | ICD-10-CM | POA: Diagnosis not present

## 2017-09-14 DIAGNOSIS — M9903 Segmental and somatic dysfunction of lumbar region: Secondary | ICD-10-CM | POA: Diagnosis not present

## 2017-09-24 DIAGNOSIS — M5442 Lumbago with sciatica, left side: Secondary | ICD-10-CM | POA: Diagnosis not present

## 2017-09-24 DIAGNOSIS — M47816 Spondylosis without myelopathy or radiculopathy, lumbar region: Secondary | ICD-10-CM | POA: Diagnosis not present

## 2017-09-24 DIAGNOSIS — M5136 Other intervertebral disc degeneration, lumbar region: Secondary | ICD-10-CM | POA: Diagnosis not present

## 2017-09-24 DIAGNOSIS — M9903 Segmental and somatic dysfunction of lumbar region: Secondary | ICD-10-CM | POA: Diagnosis not present

## 2017-10-05 DIAGNOSIS — M47816 Spondylosis without myelopathy or radiculopathy, lumbar region: Secondary | ICD-10-CM | POA: Diagnosis not present

## 2017-10-05 DIAGNOSIS — M5442 Lumbago with sciatica, left side: Secondary | ICD-10-CM | POA: Diagnosis not present

## 2017-10-05 DIAGNOSIS — M9903 Segmental and somatic dysfunction of lumbar region: Secondary | ICD-10-CM | POA: Diagnosis not present

## 2017-10-05 DIAGNOSIS — M5136 Other intervertebral disc degeneration, lumbar region: Secondary | ICD-10-CM | POA: Diagnosis not present

## 2017-10-19 DIAGNOSIS — M47816 Spondylosis without myelopathy or radiculopathy, lumbar region: Secondary | ICD-10-CM | POA: Diagnosis not present

## 2017-10-19 DIAGNOSIS — M5442 Lumbago with sciatica, left side: Secondary | ICD-10-CM | POA: Diagnosis not present

## 2017-10-19 DIAGNOSIS — M9903 Segmental and somatic dysfunction of lumbar region: Secondary | ICD-10-CM | POA: Diagnosis not present

## 2017-10-19 DIAGNOSIS — M5136 Other intervertebral disc degeneration, lumbar region: Secondary | ICD-10-CM | POA: Diagnosis not present

## 2017-10-29 DIAGNOSIS — M47816 Spondylosis without myelopathy or radiculopathy, lumbar region: Secondary | ICD-10-CM | POA: Diagnosis not present

## 2017-10-29 DIAGNOSIS — M9903 Segmental and somatic dysfunction of lumbar region: Secondary | ICD-10-CM | POA: Diagnosis not present

## 2017-10-29 DIAGNOSIS — M5442 Lumbago with sciatica, left side: Secondary | ICD-10-CM | POA: Diagnosis not present

## 2017-10-29 DIAGNOSIS — M5136 Other intervertebral disc degeneration, lumbar region: Secondary | ICD-10-CM | POA: Diagnosis not present

## 2017-11-09 DIAGNOSIS — M47816 Spondylosis without myelopathy or radiculopathy, lumbar region: Secondary | ICD-10-CM | POA: Diagnosis not present

## 2017-11-09 DIAGNOSIS — M9903 Segmental and somatic dysfunction of lumbar region: Secondary | ICD-10-CM | POA: Diagnosis not present

## 2017-11-09 DIAGNOSIS — M5136 Other intervertebral disc degeneration, lumbar region: Secondary | ICD-10-CM | POA: Diagnosis not present

## 2017-11-09 DIAGNOSIS — M5442 Lumbago with sciatica, left side: Secondary | ICD-10-CM | POA: Diagnosis not present

## 2017-11-19 DIAGNOSIS — M47816 Spondylosis without myelopathy or radiculopathy, lumbar region: Secondary | ICD-10-CM | POA: Diagnosis not present

## 2017-11-19 DIAGNOSIS — M5136 Other intervertebral disc degeneration, lumbar region: Secondary | ICD-10-CM | POA: Diagnosis not present

## 2017-11-19 DIAGNOSIS — M9903 Segmental and somatic dysfunction of lumbar region: Secondary | ICD-10-CM | POA: Diagnosis not present

## 2017-11-19 DIAGNOSIS — M5442 Lumbago with sciatica, left side: Secondary | ICD-10-CM | POA: Diagnosis not present

## 2017-11-24 DIAGNOSIS — M47816 Spondylosis without myelopathy or radiculopathy, lumbar region: Secondary | ICD-10-CM | POA: Diagnosis not present

## 2017-11-24 DIAGNOSIS — M5442 Lumbago with sciatica, left side: Secondary | ICD-10-CM | POA: Diagnosis not present

## 2017-11-24 DIAGNOSIS — M5136 Other intervertebral disc degeneration, lumbar region: Secondary | ICD-10-CM | POA: Diagnosis not present

## 2017-11-24 DIAGNOSIS — M9903 Segmental and somatic dysfunction of lumbar region: Secondary | ICD-10-CM | POA: Diagnosis not present

## 2017-12-09 DIAGNOSIS — M47816 Spondylosis without myelopathy or radiculopathy, lumbar region: Secondary | ICD-10-CM | POA: Diagnosis not present

## 2017-12-09 DIAGNOSIS — M5442 Lumbago with sciatica, left side: Secondary | ICD-10-CM | POA: Diagnosis not present

## 2017-12-09 DIAGNOSIS — M9903 Segmental and somatic dysfunction of lumbar region: Secondary | ICD-10-CM | POA: Diagnosis not present

## 2017-12-09 DIAGNOSIS — M5136 Other intervertebral disc degeneration, lumbar region: Secondary | ICD-10-CM | POA: Diagnosis not present

## 2017-12-10 DIAGNOSIS — M9903 Segmental and somatic dysfunction of lumbar region: Secondary | ICD-10-CM | POA: Diagnosis not present

## 2017-12-10 DIAGNOSIS — M5136 Other intervertebral disc degeneration, lumbar region: Secondary | ICD-10-CM | POA: Diagnosis not present

## 2017-12-10 DIAGNOSIS — M47816 Spondylosis without myelopathy or radiculopathy, lumbar region: Secondary | ICD-10-CM | POA: Diagnosis not present

## 2017-12-10 DIAGNOSIS — M5442 Lumbago with sciatica, left side: Secondary | ICD-10-CM | POA: Diagnosis not present

## 2017-12-14 DIAGNOSIS — R5383 Other fatigue: Secondary | ICD-10-CM | POA: Diagnosis not present

## 2017-12-14 DIAGNOSIS — Z780 Asymptomatic menopausal state: Secondary | ICD-10-CM | POA: Diagnosis not present

## 2017-12-14 DIAGNOSIS — Z13220 Encounter for screening for lipoid disorders: Secondary | ICD-10-CM | POA: Diagnosis not present

## 2017-12-17 DIAGNOSIS — M9903 Segmental and somatic dysfunction of lumbar region: Secondary | ICD-10-CM | POA: Diagnosis not present

## 2017-12-17 DIAGNOSIS — M5136 Other intervertebral disc degeneration, lumbar region: Secondary | ICD-10-CM | POA: Diagnosis not present

## 2017-12-17 DIAGNOSIS — M47816 Spondylosis without myelopathy or radiculopathy, lumbar region: Secondary | ICD-10-CM | POA: Diagnosis not present

## 2017-12-17 DIAGNOSIS — M5442 Lumbago with sciatica, left side: Secondary | ICD-10-CM | POA: Diagnosis not present

## 2017-12-22 DIAGNOSIS — Z0000 Encounter for general adult medical examination without abnormal findings: Secondary | ICD-10-CM | POA: Diagnosis not present

## 2017-12-22 DIAGNOSIS — R5383 Other fatigue: Secondary | ICD-10-CM | POA: Diagnosis not present

## 2017-12-22 DIAGNOSIS — Z780 Asymptomatic menopausal state: Secondary | ICD-10-CM | POA: Diagnosis not present

## 2017-12-22 DIAGNOSIS — M159 Polyosteoarthritis, unspecified: Secondary | ICD-10-CM | POA: Diagnosis not present

## 2017-12-23 DIAGNOSIS — M5442 Lumbago with sciatica, left side: Secondary | ICD-10-CM | POA: Diagnosis not present

## 2017-12-23 DIAGNOSIS — M9903 Segmental and somatic dysfunction of lumbar region: Secondary | ICD-10-CM | POA: Diagnosis not present

## 2017-12-23 DIAGNOSIS — M47816 Spondylosis without myelopathy or radiculopathy, lumbar region: Secondary | ICD-10-CM | POA: Diagnosis not present

## 2017-12-23 DIAGNOSIS — M5136 Other intervertebral disc degeneration, lumbar region: Secondary | ICD-10-CM | POA: Diagnosis not present

## 2017-12-24 DIAGNOSIS — M47816 Spondylosis without myelopathy or radiculopathy, lumbar region: Secondary | ICD-10-CM | POA: Diagnosis not present

## 2017-12-24 DIAGNOSIS — M5136 Other intervertebral disc degeneration, lumbar region: Secondary | ICD-10-CM | POA: Diagnosis not present

## 2017-12-24 DIAGNOSIS — M5442 Lumbago with sciatica, left side: Secondary | ICD-10-CM | POA: Diagnosis not present

## 2017-12-24 DIAGNOSIS — M9903 Segmental and somatic dysfunction of lumbar region: Secondary | ICD-10-CM | POA: Diagnosis not present

## 2018-01-12 DIAGNOSIS — R69 Illness, unspecified: Secondary | ICD-10-CM | POA: Diagnosis not present

## 2018-01-12 DIAGNOSIS — L821 Other seborrheic keratosis: Secondary | ICD-10-CM | POA: Diagnosis not present

## 2018-01-12 DIAGNOSIS — Z23 Encounter for immunization: Secondary | ICD-10-CM | POA: Diagnosis not present

## 2018-01-12 DIAGNOSIS — H0259 Other disorders affecting eyelid function: Secondary | ICD-10-CM | POA: Diagnosis not present

## 2018-01-14 DIAGNOSIS — M9903 Segmental and somatic dysfunction of lumbar region: Secondary | ICD-10-CM | POA: Diagnosis not present

## 2018-01-14 DIAGNOSIS — M5442 Lumbago with sciatica, left side: Secondary | ICD-10-CM | POA: Diagnosis not present

## 2018-01-14 DIAGNOSIS — M47816 Spondylosis without myelopathy or radiculopathy, lumbar region: Secondary | ICD-10-CM | POA: Diagnosis not present

## 2018-01-14 DIAGNOSIS — M5136 Other intervertebral disc degeneration, lumbar region: Secondary | ICD-10-CM | POA: Diagnosis not present

## 2018-01-18 DIAGNOSIS — M5442 Lumbago with sciatica, left side: Secondary | ICD-10-CM | POA: Diagnosis not present

## 2018-01-18 DIAGNOSIS — M47816 Spondylosis without myelopathy or radiculopathy, lumbar region: Secondary | ICD-10-CM | POA: Diagnosis not present

## 2018-01-18 DIAGNOSIS — M9903 Segmental and somatic dysfunction of lumbar region: Secondary | ICD-10-CM | POA: Diagnosis not present

## 2018-01-18 DIAGNOSIS — M5136 Other intervertebral disc degeneration, lumbar region: Secondary | ICD-10-CM | POA: Diagnosis not present

## 2018-01-26 DIAGNOSIS — R69 Illness, unspecified: Secondary | ICD-10-CM | POA: Diagnosis not present

## 2018-01-26 DIAGNOSIS — R7989 Other specified abnormal findings of blood chemistry: Secondary | ICD-10-CM | POA: Diagnosis not present

## 2018-01-26 DIAGNOSIS — L299 Pruritus, unspecified: Secondary | ICD-10-CM | POA: Diagnosis not present

## 2018-01-26 DIAGNOSIS — R635 Abnormal weight gain: Secondary | ICD-10-CM | POA: Diagnosis not present

## 2018-01-28 DIAGNOSIS — M9903 Segmental and somatic dysfunction of lumbar region: Secondary | ICD-10-CM | POA: Diagnosis not present

## 2018-01-28 DIAGNOSIS — M47816 Spondylosis without myelopathy or radiculopathy, lumbar region: Secondary | ICD-10-CM | POA: Diagnosis not present

## 2018-01-28 DIAGNOSIS — M5136 Other intervertebral disc degeneration, lumbar region: Secondary | ICD-10-CM | POA: Diagnosis not present

## 2018-01-28 DIAGNOSIS — M5442 Lumbago with sciatica, left side: Secondary | ICD-10-CM | POA: Diagnosis not present

## 2018-02-09 DIAGNOSIS — M9903 Segmental and somatic dysfunction of lumbar region: Secondary | ICD-10-CM | POA: Diagnosis not present

## 2018-02-09 DIAGNOSIS — M47816 Spondylosis without myelopathy or radiculopathy, lumbar region: Secondary | ICD-10-CM | POA: Diagnosis not present

## 2018-02-09 DIAGNOSIS — M5442 Lumbago with sciatica, left side: Secondary | ICD-10-CM | POA: Diagnosis not present

## 2018-02-09 DIAGNOSIS — M5136 Other intervertebral disc degeneration, lumbar region: Secondary | ICD-10-CM | POA: Diagnosis not present

## 2018-02-12 DIAGNOSIS — H02834 Dermatochalasis of left upper eyelid: Secondary | ICD-10-CM | POA: Diagnosis not present

## 2018-02-12 DIAGNOSIS — H02831 Dermatochalasis of right upper eyelid: Secondary | ICD-10-CM | POA: Diagnosis not present

## 2018-02-12 DIAGNOSIS — H25813 Combined forms of age-related cataract, bilateral: Secondary | ICD-10-CM | POA: Diagnosis not present

## 2018-02-18 ENCOUNTER — Ambulatory Visit (INDEPENDENT_AMBULATORY_CARE_PROVIDER_SITE_OTHER): Payer: Medicare HMO | Admitting: Physician Assistant

## 2018-02-23 DIAGNOSIS — M5442 Lumbago with sciatica, left side: Secondary | ICD-10-CM | POA: Diagnosis not present

## 2018-02-23 DIAGNOSIS — M47816 Spondylosis without myelopathy or radiculopathy, lumbar region: Secondary | ICD-10-CM | POA: Diagnosis not present

## 2018-02-23 DIAGNOSIS — M9903 Segmental and somatic dysfunction of lumbar region: Secondary | ICD-10-CM | POA: Diagnosis not present

## 2018-02-23 DIAGNOSIS — M5136 Other intervertebral disc degeneration, lumbar region: Secondary | ICD-10-CM | POA: Diagnosis not present

## 2018-02-24 DIAGNOSIS — H57813 Brow ptosis, bilateral: Secondary | ICD-10-CM | POA: Diagnosis not present

## 2018-02-24 DIAGNOSIS — H53483 Generalized contraction of visual field, bilateral: Secondary | ICD-10-CM | POA: Diagnosis not present

## 2018-02-24 DIAGNOSIS — H02831 Dermatochalasis of right upper eyelid: Secondary | ICD-10-CM | POA: Diagnosis not present

## 2018-02-24 DIAGNOSIS — D23112 Other benign neoplasm of skin of right lower eyelid, including canthus: Secondary | ICD-10-CM | POA: Diagnosis not present

## 2018-02-24 DIAGNOSIS — H53482 Generalized contraction of visual field, left eye: Secondary | ICD-10-CM | POA: Diagnosis not present

## 2018-02-24 DIAGNOSIS — H02834 Dermatochalasis of left upper eyelid: Secondary | ICD-10-CM | POA: Diagnosis not present

## 2018-02-24 DIAGNOSIS — H02413 Mechanical ptosis of bilateral eyelids: Secondary | ICD-10-CM | POA: Diagnosis not present

## 2018-02-24 DIAGNOSIS — H0279 Other degenerative disorders of eyelid and periocular area: Secondary | ICD-10-CM | POA: Diagnosis not present

## 2018-02-24 DIAGNOSIS — D485 Neoplasm of uncertain behavior of skin: Secondary | ICD-10-CM | POA: Diagnosis not present

## 2018-02-24 DIAGNOSIS — H53481 Generalized contraction of visual field, right eye: Secondary | ICD-10-CM | POA: Diagnosis not present

## 2018-02-25 ENCOUNTER — Encounter (INDEPENDENT_AMBULATORY_CARE_PROVIDER_SITE_OTHER): Payer: Self-pay | Admitting: Physician Assistant

## 2018-02-25 ENCOUNTER — Ambulatory Visit (INDEPENDENT_AMBULATORY_CARE_PROVIDER_SITE_OTHER): Payer: Medicare HMO

## 2018-02-25 ENCOUNTER — Ambulatory Visit (INDEPENDENT_AMBULATORY_CARE_PROVIDER_SITE_OTHER): Payer: Medicare HMO | Admitting: Physician Assistant

## 2018-02-25 DIAGNOSIS — M25511 Pain in right shoulder: Secondary | ICD-10-CM | POA: Diagnosis not present

## 2018-02-25 MED ORDER — LIDOCAINE HCL 1 % IJ SOLN
3.0000 mL | INTRAMUSCULAR | Status: AC | PRN
  Administered 2018-02-25: 3 mL

## 2018-02-25 MED ORDER — METHYLPREDNISOLONE ACETATE 40 MG/ML IJ SUSP
40.0000 mg | INTRAMUSCULAR | Status: AC | PRN
  Administered 2018-02-25: 40 mg via INTRA_ARTICULAR

## 2018-02-25 NOTE — Progress Notes (Signed)
Office Visit Note   Patient: Tara Fernandez           Date of Birth: May 07, 1950           MRN: [id number] Visit Date: 02/25/2018              Requested by: Merrilee Seashore, [address], [place] [address] PCP: Merrilee Seashore, MD   Assessment & Plan: Visit Diagnoses:  1. Acute pain of right shoulder     Plan: We will have her doing well crawls, and pendulum exercises, and forward flexion exercises to the right shoulder.  See her back in 2 weeks see her response to the cortisone injection.  Did speak with her about the arthritic changes seen on the x-rays and the right knee injection today may not take away all of her pain.  She may require an intra-articular injection in the future.  Follow-Up Instructions: Return in about 2 weeks (around 03/11/2018).   Orders:  Orders Placed This Encounter  Procedures  . Large Joint Inj  . XR Shoulder Right   No orders of the defined types were placed in this encounter.     Procedures: Large Joint Inj: R subacromial bursa on 02/25/2018 1:48 PM Indications: pain Details: 22 G 1.5 in needle, superior approach  Arthrogram: No  Medications: 3 mL lidocaine 1 %; 40 mg methylPREDNISolone acetate 40 MG/ML Outcome: tolerated well, no immediate complications Procedure, treatment alternatives, risks and benefits explained, specific risks discussed. Consent was given by the patient. Immediately prior to procedure a time out was called to verify the correct patient, procedure, equipment, support staff and site/side marked as required. Patient was prepped and draped in the usual sterile fashion.       Clinical Data: No additional findings.   Subjective: Chief Complaint  Patient presents with  . Right Shoulder - Pain    HPI Tara Fernandez is well-known to our department service comes in today with new complaint of right shoulder pain is been ongoing for couple of months.  No known injury.  She is asking about a  possible injection in the shoulder today.  Said no numbness tingling down the arm.  Pains mostly anterior aspect of the shoulder. Review of Systems See HPI  Objective: Vital Signs: LMP 01/07/1996 (Approximate)   Physical Exam Constitutional:      Appearance: She is not ill-appearing or diaphoretic.  Pulmonary:     Effort: Pulmonary effort is normal.  Neurological:     Mental Status: She is alert and oriented to person, place, and time.  Psychiatric:        Mood and Affect: Mood normal.        Behavior: Behavior normal.     Ortho Exam Right shoulder positive impingement sign.  Full forward flexion.  5 out of 5 strength both shoulders with external/internal rotation against resistance.  Limited internal rotation of the right shoulder compared to full on the left.  Passive range of motion of the right shoulder reveals slight limitation in internal and external rotation passively with mild crepitus. Specialty Comments:  No specialty comments available.  Imaging: Xr Shoulder Right  Result Date: 02/25/2018 Right shoulder: 3 views show moderate arthritic changes with osteophytes of the humeral head and narrowing of right shoulder glenohumeral joint.  Axillary view shows moderate narrowing of the glenohumeral joint.  No acute fractures.  Shoulders well located.    PMFS History: Patient Active Problem List   Diagnosis Date Noted  . Chondromalacia  of right patellofemoral joint 05/18/2013  . Leg length discrepancy 04/15/2013  . Arthritis of right hip 02/11/2013  . Status post THR (total hip replacement) 02/11/2013  . Degenerative arthritis of hip 06/17/2012   Past Medical History:  Diagnosis Date  . Arthritis    osteoarthritis; PAIN RIGHT HIP  . PONV (postoperative nausea and vomiting)   . Vaginal pessary present    PT HAS SMALL BLOOD BLISTER  - UP INSIDE VAGINA - PT'S GYN DOCTOR- DR LATHROP- AWARE -TOLD TO USE PREMARIN VAGINAL CREAM.  PT DOES NOT HAVE VAGINAL BLEEDING    Family  History  Problem Relation Age of Onset  . Heart attack Father   . Cancer - Lung Sister   . Breast cancer Sister 37  . Cancer - Lung Mother     Past Surgical History:  Procedure Laterality Date  . ABDOMINAL HYSTERECTOMY  03/05/15   LAPAROSCOPIC ASSISTED VAGINAL HYSTERECTOMY WITH SALPINGECTOMY  . ANTERIOR AND POSTERIOR REPAIR N/A 03/05/2015   Procedure: ANTERIOR (CYSTOCELE) REPAIR, VAULT PROLAPSE AND GRAFT;  Surgeon: Bjorn Loser, MD;  Location: [facility];  Service: Urology;  Laterality: N/A;  . BREAST MASS EXCISION     Hx: of left breast  . BREAST SURGERY    . CARTILAGE SURGERY  10 th grade   RIGHT KNEE  . CYSTOSCOPY N/A 03/05/2015   Procedure: CYSTOSCOPY;  Surgeon: Bjorn Loser, MD;  Location: [facility];  Service: Urology;  Laterality: N/A;  . LAPAROSCOPIC VAGINAL HYSTERECTOMY WITH SALPINGECTOMY Bilateral 03/05/2015   Procedure: LAPAROSCOPIC ASSISTED VAGINAL HYSTERECTOMY WITH SALPINGECTOMY;  Surgeon: Megan Salon, MD;  Location: [facility];  Service: Gynecology;  Laterality: Bilateral;  . TOTAL HIP ARTHROPLASTY Left 06/17/2012   Procedure: LEFT TOTAL HIP ARTHROPLASTY ANTERIOR APPROACH;  Surgeon: Mcarthur Rossetti, MD;  Location: [facility];  Service: Orthopedics;  Laterality: Left;  . TOTAL HIP ARTHROPLASTY Right 02/11/2013   Procedure: RIGHT TOTAL HIP ARTHROPLASTY ANTERIOR APPROACH;  Surgeon: Mcarthur Rossetti, MD;  Location: [facility];  Service: Orthopedics;  Laterality: Right;   Social History   Occupational History  . Not on file  Tobacco Use  . Smoking status: Never Smoker  . Smokeless tobacco: Never Used  Substance and Sexual Activity  . Alcohol use: Yes    Alcohol/week: 0.0 standard drinks    Comment: occasional WINE  . Drug use: No  . Sexual activity: Yes    Partners: Male    Birth control/protection: Post-menopausal

## 2018-02-26 ENCOUNTER — Telehealth: Payer: Self-pay | Admitting: Obstetrics & Gynecology

## 2018-02-26 ENCOUNTER — Ambulatory Visit: Payer: Medicare HMO | Admitting: Obstetrics & Gynecology

## 2018-02-26 ENCOUNTER — Ambulatory Visit (INDEPENDENT_AMBULATORY_CARE_PROVIDER_SITE_OTHER): Payer: Medicare HMO | Admitting: Obstetrics & Gynecology

## 2018-02-26 ENCOUNTER — Encounter: Payer: Self-pay | Admitting: Obstetrics & Gynecology

## 2018-02-26 VITALS — BP 122/76 | HR 66 | Resp 14 | Ht 62.0 in | Wt 129.0 lb

## 2018-02-26 DIAGNOSIS — Z01419 Encounter for gynecological examination (general) (routine) without abnormal findings: Secondary | ICD-10-CM

## 2018-02-26 MED ORDER — FLUOXETINE HCL 10 MG PO TABS: 10.0000 mg | ORAL_TABLET | Freq: Every day | ORAL | 1 refills | Status: DC

## 2018-02-26 NOTE — Progress Notes (Signed)
68 y.o. G11P2002 Married White or Caucasian female here for annual exam.  Doing well.  Denies vaginal bleeding.  Has decreased libido that seem.  She does have some sensation of feeling "tighter".    Mother is local and still lives alone.  Mother does not drive.  Patient takes care of all of her mother's need.  Also, helps her brother with care.  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  She feels she needs some treatment for this.    Did see a provider about her eye lids.  She is hopefully going to have eye lift done in April.    Patient's last menstrual period was 01/07/1996 (approximate).          Sexually active: Yes.    The current method of family planning is status post hysterectomy.    Exercising: Yes.    Gym/ health club routine includes yoga. Smoker:  no  Health Maintenance: Pap:  02/23/15 neg   10/15/12 Neg. HR HPV:neg  History of abnormal Pap:  yes MMG:  02/09/17 BIRADS1:neg  Colonoscopy:  Negative cologuard 2019 with Dr. Rogelia Mire  BMD:  Never TDaP:  Not sure  Pneumonia vaccine(s):  no Shingrix:   no Hep C testing: no Screening Labs: PCP   reports that she has never smoked. She has never used smokeless tobacco. She reports current alcohol use. She reports that she does not use drugs.  Past Medical History:  Diagnosis Date  . Arthritis    osteoarthritis; PAIN RIGHT HIP  . PONV (postoperative nausea and vomiting)   . Vaginal pessary present    PT HAS SMALL BLOOD BLISTER  - UP INSIDE VAGINA - PT'S GYN DOCTOR- DR LATHROP- AWARE -TOLD TO USE PREMARIN VAGINAL CREAM.  PT DOES NOT HAVE VAGINAL BLEEDING    Past Surgical History:  Procedure Laterality Date  . ABDOMINAL HYSTERECTOMY  03/05/15   LAPAROSCOPIC ASSISTED VAGINAL HYSTERECTOMY WITH SALPINGECTOMY  . ANTERIOR AND POSTERIOR REPAIR N/A 03/05/2015   Procedure: ANTERIOR (CYSTOCELE) REPAIR, VAULT PROLAPSE AND GRAFT;  Surgeon: Bjorn Loser, MD;  Location: [facility];  Service: Urology;  Laterality: N/A;  . BREAST MASS  EXCISION     Hx: of left breast  . BREAST SURGERY    . CARTILAGE SURGERY  10 th grade   RIGHT KNEE  . CYSTOSCOPY N/A 03/05/2015   Procedure: CYSTOSCOPY;  Surgeon: Bjorn Loser, MD;  Location: [facility];  Service: Urology;  Laterality: N/A;  . LAPAROSCOPIC VAGINAL HYSTERECTOMY WITH SALPINGECTOMY Bilateral 03/05/2015   Procedure: LAPAROSCOPIC ASSISTED VAGINAL HYSTERECTOMY WITH SALPINGECTOMY;  Surgeon: Megan Salon, MD;  Location: [facility];  Service: Gynecology;  Laterality: Bilateral;  . TOTAL HIP ARTHROPLASTY Left 06/17/2012   Procedure: LEFT TOTAL HIP ARTHROPLASTY ANTERIOR APPROACH;  Surgeon: Mcarthur Rossetti, MD;  Location: [facility];  Service: Orthopedics;  Laterality: Left;  . TOTAL HIP ARTHROPLASTY Right 02/11/2013   Procedure: RIGHT TOTAL HIP ARTHROPLASTY ANTERIOR APPROACH;  Surgeon: Mcarthur Rossetti, MD;  Location: [facility];  Service: Orthopedics;  Laterality: Right;    Current Outpatient Medications  Medication Sig Dispense Refill  . ibuprofen (ADVIL,MOTRIN) 200 MG tablet Take 200 mg by mouth every 6 (six) hours as needed.     No current facility-administered medications for this visit.     Family History  Problem Relation Age of Onset  . Heart attack Father   . Cancer - Lung Sister   . Breast cancer Sister 27  . Cancer - Lung Mother     Review of  Systems  All other systems reviewed and are negative.   Exam:   BP 122/76   Pulse 66   Resp 14   Ht 5\' 2"  (1.575 m)   Wt 129 lb (58.5 kg)   LMP 01/07/1996 (Approximate)   BMI 23.59 kg/m     Height: 5\' 2"  (157.5 cm)  Ht Readings from Last 3 Encounters:  02/26/18 5\' 2"  (1.575 m)  02/19/16 5\' 3"  (1.6 m)  01/31/16 5\' 3"  (1.6 m)    General appearance: alert, cooperative and appears stated age Head: Normocephalic, without obvious abnormality, atraumatic Neck: no adenopathy, supple, symmetrical, trachea midline and thyroid normal to inspection and palpation Lungs: clear to auscultation bilaterally Breasts: normal  appearance, no masses or tenderness Heart: regular rate and rhythm Abdomen: soft, non-tender; bowel sounds normal; no masses,  no organomegaly Extremities: extremities normal, atraumatic, no cyanosis or edema Skin: Skin color, texture, turgor normal. No rashes or lesions Lymph nodes: Cervical, supraclavicular, and axillary nodes normal. No abnormal inguinal nodes palpated Neurologic: Grossly normal   Pelvic: External genitalia:  no lesions              Urethra:  normal appearing urethra with no masses, tenderness or lesions              Bartholins and Skenes: normal                 Vagina: normal appearing vagina with normal color and discharge, no lesions              Cervix: absent              Pap taken: No. Bimanual Exam:  Uterus:  uterus absent              Adnexa: no mass, fullness, tenderness               Rectovaginal: Confirms               Anus:  normal sphincter tone, no lesions  Chaperone was present for exam.  A:  Well Woman with normal exam PMP, no HRT H/O LAVH/Bilateral salpingectomy with cystoclee repair and vula suspension with cystoscopy with Dr. Lunette Stands Hypothyroidism Stressors, social  P:   Mammogram guidelines reviewed.  Doing yearly. pap smear not indicated Trial of prozac 10mg  daily.  She will start with 1/2 tab daily for four days and increase to full dosage.  Will give update in 2-4 weeks.  May need 20mg  dosage eventually. Release for cologuard and vaccine records so I can update Tdap signed today.  Cologuard was done 2019 per her hx. Lab work done with PCP last year. Shingrix vaccination reviewed.  Considering. Return annually or prn

## 2018-02-26 NOTE — Telephone Encounter (Signed)
Routing to Dr. Sabra Heck to review and advise for patient next annual exam.

## 2018-02-26 NOTE — Telephone Encounter (Signed)
Patient was seen today and is asking if she really needs to return next year for her aex?

## 2018-02-26 NOTE — Telephone Encounter (Signed)
She will need an appt in a year if needs RF for any medication.

## 2018-03-01 ENCOUNTER — Encounter: Payer: Self-pay | Admitting: Obstetrics & Gynecology

## 2018-03-01 DIAGNOSIS — Z1231 Encounter for screening mammogram for malignant neoplasm of breast: Secondary | ICD-10-CM | POA: Diagnosis not present

## 2018-03-02 ENCOUNTER — Telehealth: Payer: Self-pay | Admitting: Obstetrics & Gynecology

## 2018-03-02 NOTE — Telephone Encounter (Signed)
Patient would like to speak with nurse about her prescription.

## 2018-03-02 NOTE — Telephone Encounter (Signed)
Spoke with patient, calling to review prozac instructions discussed at 02/26/18 AEX.   Advised Per review of OV dated 02/26/18: Trial of prozac 10mg  daily.  She will start with 1/2 tab daily for four days and increase to full dosage. Will give update in 2-4 weeks.  May need 20mg  dosage eventually  Patient states she is doing well with 1/2 tab, 5mg , will plan to increase to full dosage and will call with update. Questions answered. Patient thankful for return call.   Routing to provider for final review. Patient is agreeable to disposition. Will close encounter.

## 2018-03-02 NOTE — Telephone Encounter (Signed)
Left detailed message, ok per dpr, name identified on voicemail. Advised as seen below per Dr. Sabra Heck, return call to office to schedule AEX, if needed, or with any additional questions.   Encounter closed.

## 2018-03-05 ENCOUNTER — Telehealth: Payer: Self-pay | Admitting: *Deleted

## 2018-03-05 NOTE — Telephone Encounter (Signed)
-----   Message from Megan Salon, MD sent at 03/04/2018  6:18 PM EST ----- Regarding: vaccines records Can you please make a phone note and let pt know Dr. Rogelia Mire had no vaccines on file for her.  Therefore, she needs the pneumonia vaccinations and tdap updated.  Does she want my help with this or when she sees new PCP?  Thanks.  [name]

## 2018-03-05 NOTE — Telephone Encounter (Signed)
Called patient and notified. States she does not get the pneumonia injections and will think about getting Tdap but does not think it is necessary.  Dr. Lestine Box Encounter closed.

## 2018-03-08 DIAGNOSIS — M5442 Lumbago with sciatica, left side: Secondary | ICD-10-CM | POA: Diagnosis not present

## 2018-03-08 DIAGNOSIS — M47816 Spondylosis without myelopathy or radiculopathy, lumbar region: Secondary | ICD-10-CM | POA: Diagnosis not present

## 2018-03-08 DIAGNOSIS — M9903 Segmental and somatic dysfunction of lumbar region: Secondary | ICD-10-CM | POA: Diagnosis not present

## 2018-03-08 DIAGNOSIS — M5136 Other intervertebral disc degeneration, lumbar region: Secondary | ICD-10-CM | POA: Diagnosis not present

## 2018-03-10 ENCOUNTER — Ambulatory Visit (INDEPENDENT_AMBULATORY_CARE_PROVIDER_SITE_OTHER): Payer: Medicare HMO | Admitting: Physician Assistant

## 2018-03-13 ENCOUNTER — Encounter: Payer: Self-pay | Admitting: Obstetrics & Gynecology

## 2018-03-15 ENCOUNTER — Telehealth: Payer: Self-pay | Admitting: *Deleted

## 2018-03-15 MED ORDER — PAROXETINE HCL 10 MG PO TABS: 10.0000 mg | ORAL_TABLET | Freq: Every day | ORAL | 2 refills | Status: DC

## 2018-03-15 NOTE — Telephone Encounter (Signed)
Spoke with patient, advised as seen below per Dr. Sabra Heck. Rx to confirmed pharmacy, Prozac Rx discontinued.  Patient read back instructions, verbalizes understanding and is agreeable.  Encounter closed.

## 2018-03-15 NOTE — Telephone Encounter (Signed)
Subject:  Fluoxextine 10 mg tablet  Dear Dr. Sabra Heck:  Started my new Med's on Saturday 2/22. 5mg  till 2/28  Started on 10mg . 2/29 - 3/6  Felt pretty good for awhile  Side effects:  Dry Mouth  Yawning all the time  Hot Flashs  Eatting all the time this was driving me nuts  no energy at the end of the day.  Headaches. That lasted from Thursday 3/5th-til Saturday   So, I have stop taking them as of 3/7  Could we try the Paxil since I have been on them in the pass?  What are your thoughts.  Thank you   Tara Fernandez

## 2018-03-15 NOTE — Telephone Encounter (Signed)
Yes, I'm happy to change her prescription to paxil 10mg , 1/2 tab daily x 6 days and then increase to 10mg  daily.  30 day supply with 2RFs.  Thanks.

## 2018-03-15 NOTE — Telephone Encounter (Signed)
Routing to Dr. Miller to review and advise.  

## 2018-03-18 DIAGNOSIS — M47816 Spondylosis without myelopathy or radiculopathy, lumbar region: Secondary | ICD-10-CM | POA: Diagnosis not present

## 2018-03-18 DIAGNOSIS — M5136 Other intervertebral disc degeneration, lumbar region: Secondary | ICD-10-CM | POA: Diagnosis not present

## 2018-03-18 DIAGNOSIS — M5442 Lumbago with sciatica, left side: Secondary | ICD-10-CM | POA: Diagnosis not present

## 2018-03-18 DIAGNOSIS — M9903 Segmental and somatic dysfunction of lumbar region: Secondary | ICD-10-CM | POA: Diagnosis not present

## 2018-03-22 DIAGNOSIS — M47816 Spondylosis without myelopathy or radiculopathy, lumbar region: Secondary | ICD-10-CM | POA: Diagnosis not present

## 2018-03-22 DIAGNOSIS — M5136 Other intervertebral disc degeneration, lumbar region: Secondary | ICD-10-CM | POA: Diagnosis not present

## 2018-03-22 DIAGNOSIS — M5442 Lumbago with sciatica, left side: Secondary | ICD-10-CM | POA: Diagnosis not present

## 2018-03-22 DIAGNOSIS — M9903 Segmental and somatic dysfunction of lumbar region: Secondary | ICD-10-CM | POA: Diagnosis not present

## 2018-04-05 DIAGNOSIS — M9903 Segmental and somatic dysfunction of lumbar region: Secondary | ICD-10-CM | POA: Diagnosis not present

## 2018-04-05 DIAGNOSIS — M47816 Spondylosis without myelopathy or radiculopathy, lumbar region: Secondary | ICD-10-CM | POA: Diagnosis not present

## 2018-04-05 DIAGNOSIS — M5136 Other intervertebral disc degeneration, lumbar region: Secondary | ICD-10-CM | POA: Diagnosis not present

## 2018-04-05 DIAGNOSIS — M5442 Lumbago with sciatica, left side: Secondary | ICD-10-CM | POA: Diagnosis not present

## 2018-04-06 ENCOUNTER — Other Ambulatory Visit: Payer: Self-pay | Admitting: Obstetrics & Gynecology

## 2018-04-06 NOTE — Telephone Encounter (Signed)
Medication refill request: paxil Last AEX:  02/26/18 SM Next AEX: 08/06/19 SM Last MMG (if hormonal medication request): 02/09/17 BIRADS1:neg  Refill authorized: 03/15/18 #[id number]/[facility]R [facility] [address].   Pharmacy requesting 90 days supply. Please advise.

## 2018-04-06 NOTE — Telephone Encounter (Signed)
The patient just started on the paxil earlier this month. Can you please see how she is doing on the current dose. She is on a low dose, this can be increased if needed.

## 2018-04-07 MED ORDER — PAROXETINE HCL 20 MG PO TABS: 20.0000 mg | ORAL_TABLET | Freq: Every day | ORAL | 1 refills | Status: DC

## 2018-04-07 NOTE — Telephone Encounter (Signed)
Patient states she if can take 15mg ? States she is doing well with it but would like to increase it a little.

## 2018-04-07 NOTE — Telephone Encounter (Signed)
Typically paxil is increased by 10 mg. I would recommend increasing to 20 mg a day. 1 month with one refill was sent. She should f/u in one month.

## 2018-04-19 DIAGNOSIS — M47816 Spondylosis without myelopathy or radiculopathy, lumbar region: Secondary | ICD-10-CM | POA: Diagnosis not present

## 2018-04-19 DIAGNOSIS — M9903 Segmental and somatic dysfunction of lumbar region: Secondary | ICD-10-CM | POA: Diagnosis not present

## 2018-04-19 DIAGNOSIS — M5136 Other intervertebral disc degeneration, lumbar region: Secondary | ICD-10-CM | POA: Diagnosis not present

## 2018-04-19 DIAGNOSIS — M5442 Lumbago with sciatica, left side: Secondary | ICD-10-CM | POA: Diagnosis not present

## 2018-04-26 DIAGNOSIS — M9903 Segmental and somatic dysfunction of lumbar region: Secondary | ICD-10-CM | POA: Diagnosis not present

## 2018-04-26 DIAGNOSIS — M5442 Lumbago with sciatica, left side: Secondary | ICD-10-CM | POA: Diagnosis not present

## 2018-04-26 DIAGNOSIS — M5136 Other intervertebral disc degeneration, lumbar region: Secondary | ICD-10-CM | POA: Diagnosis not present

## 2018-04-26 DIAGNOSIS — M47816 Spondylosis without myelopathy or radiculopathy, lumbar region: Secondary | ICD-10-CM | POA: Diagnosis not present

## 2018-04-29 ENCOUNTER — Other Ambulatory Visit: Payer: Self-pay | Admitting: Obstetrics and Gynecology

## 2018-04-29 NOTE — Telephone Encounter (Signed)
Please check how the patient is doing on the Paxil, this was just recently prescribed for her (took it in the past). If she is doing great, then refill until her next visit. If she is having any questions or feels she needs a different dose then please set up a WebEx visit with Dr Sabra Heck

## 2018-04-29 NOTE — Telephone Encounter (Signed)
Medication refill request: paxil 20mg  Last AEX:  02-26-2018 Next AEX: 07-19-2019 Last MMG (if hormonal medication request): 03-01-2018 category b density birads 1:neg Refill authorized: pharmacy is requesting 90 day supply. Please approve or deny as appropriate

## 2018-04-29 NOTE — Telephone Encounter (Signed)
Spoke with patient. Patient states she is doing well on Paxil 20 mg daily. Is experiencing some insomnia, does not want to make any changes at this time. Rx to pharmacy for Paxil 20 mg po daily, #90/2RF. Patient aware to call with any concerns.   Routing to provider for final review. Patient is agreeable to disposition. Will close encounter.

## 2018-05-03 DIAGNOSIS — M47816 Spondylosis without myelopathy or radiculopathy, lumbar region: Secondary | ICD-10-CM | POA: Diagnosis not present

## 2018-05-03 DIAGNOSIS — M9903 Segmental and somatic dysfunction of lumbar region: Secondary | ICD-10-CM | POA: Diagnosis not present

## 2018-05-03 DIAGNOSIS — M5442 Lumbago with sciatica, left side: Secondary | ICD-10-CM | POA: Diagnosis not present

## 2018-05-03 DIAGNOSIS — M5136 Other intervertebral disc degeneration, lumbar region: Secondary | ICD-10-CM | POA: Diagnosis not present

## 2018-05-25 ENCOUNTER — Telehealth: Payer: Self-pay | Admitting: Obstetrics & Gynecology

## 2018-05-25 DIAGNOSIS — M5442 Lumbago with sciatica, left side: Secondary | ICD-10-CM | POA: Diagnosis not present

## 2018-05-25 DIAGNOSIS — M47816 Spondylosis without myelopathy or radiculopathy, lumbar region: Secondary | ICD-10-CM | POA: Diagnosis not present

## 2018-05-25 DIAGNOSIS — M5136 Other intervertebral disc degeneration, lumbar region: Secondary | ICD-10-CM | POA: Diagnosis not present

## 2018-05-25 DIAGNOSIS — M9903 Segmental and somatic dysfunction of lumbar region: Secondary | ICD-10-CM | POA: Diagnosis not present

## 2018-05-25 NOTE — Telephone Encounter (Signed)
She does not need to stop the Paxil, just FYI.  Ok to call and let her know.  Thanks.

## 2018-05-25 NOTE — Telephone Encounter (Signed)
Spoke with patient. Patient states she is having surgery on 5/20 to have "eye lids fixed'. Patient was prescribed Lorazepam 1mg  and Diazepam 5mg  to take 1.5 hrs before procedure. Patient asking if she should stop Paxil?   Instructed patient to contact surgeons office to confirm preop instructions. Patient states she has contacted their office and was advised she would be contacted if there were any problems. Advised patient to f/u with surgeon to confirm and clarify pre-op instructions. Advised Dr. Sabra Heck will review, our office will return call if any additional recommendations.  Patient disconnected call.   Routing to provider for final review. Patient is agreeable to disposition. Will close encounter.

## 2018-05-25 NOTE — Telephone Encounter (Signed)
Left message to call Ascher Schroepfer, RN at [facility] [phone].   

## 2018-05-25 NOTE — Telephone Encounter (Signed)
Patient is having a procedure and wondering if she should continue taking paxil.

## 2018-05-25 NOTE — Telephone Encounter (Signed)
Call return to patient, left detailed message, ok per dpr. Advised as seen below per Dr. Sabra Heck. Advised to return call to office if any additional questions.   Encounter closed.

## 2018-05-26 DIAGNOSIS — H02831 Dermatochalasis of right upper eyelid: Secondary | ICD-10-CM | POA: Diagnosis not present

## 2018-05-26 DIAGNOSIS — H02834 Dermatochalasis of left upper eyelid: Secondary | ICD-10-CM | POA: Diagnosis not present

## 2018-06-03 DIAGNOSIS — M47816 Spondylosis without myelopathy or radiculopathy, lumbar region: Secondary | ICD-10-CM | POA: Diagnosis not present

## 2018-06-03 DIAGNOSIS — M9903 Segmental and somatic dysfunction of lumbar region: Secondary | ICD-10-CM | POA: Diagnosis not present

## 2018-06-03 DIAGNOSIS — M5136 Other intervertebral disc degeneration, lumbar region: Secondary | ICD-10-CM | POA: Diagnosis not present

## 2018-06-03 DIAGNOSIS — M5442 Lumbago with sciatica, left side: Secondary | ICD-10-CM | POA: Diagnosis not present

## 2018-06-21 DIAGNOSIS — M47816 Spondylosis without myelopathy or radiculopathy, lumbar region: Secondary | ICD-10-CM | POA: Diagnosis not present

## 2018-06-21 DIAGNOSIS — M9903 Segmental and somatic dysfunction of lumbar region: Secondary | ICD-10-CM | POA: Diagnosis not present

## 2018-06-21 DIAGNOSIS — M5442 Lumbago with sciatica, left side: Secondary | ICD-10-CM | POA: Diagnosis not present

## 2018-06-21 DIAGNOSIS — M5136 Other intervertebral disc degeneration, lumbar region: Secondary | ICD-10-CM | POA: Diagnosis not present

## 2018-06-24 DIAGNOSIS — H25811 Combined forms of age-related cataract, right eye: Secondary | ICD-10-CM | POA: Diagnosis not present

## 2018-06-28 DIAGNOSIS — M5442 Lumbago with sciatica, left side: Secondary | ICD-10-CM | POA: Diagnosis not present

## 2018-06-28 DIAGNOSIS — M5136 Other intervertebral disc degeneration, lumbar region: Secondary | ICD-10-CM | POA: Diagnosis not present

## 2018-06-28 DIAGNOSIS — M47816 Spondylosis without myelopathy or radiculopathy, lumbar region: Secondary | ICD-10-CM | POA: Diagnosis not present

## 2018-06-28 DIAGNOSIS — M9903 Segmental and somatic dysfunction of lumbar region: Secondary | ICD-10-CM | POA: Diagnosis not present

## 2018-06-29 DIAGNOSIS — R69 Illness, unspecified: Secondary | ICD-10-CM | POA: Diagnosis not present

## 2018-07-01 DIAGNOSIS — M5136 Other intervertebral disc degeneration, lumbar region: Secondary | ICD-10-CM | POA: Diagnosis not present

## 2018-07-01 DIAGNOSIS — M9903 Segmental and somatic dysfunction of lumbar region: Secondary | ICD-10-CM | POA: Diagnosis not present

## 2018-07-01 DIAGNOSIS — M47816 Spondylosis without myelopathy or radiculopathy, lumbar region: Secondary | ICD-10-CM | POA: Diagnosis not present

## 2018-07-01 DIAGNOSIS — M5442 Lumbago with sciatica, left side: Secondary | ICD-10-CM | POA: Diagnosis not present

## 2018-07-05 DIAGNOSIS — H25811 Combined forms of age-related cataract, right eye: Secondary | ICD-10-CM | POA: Diagnosis not present

## 2018-07-05 DIAGNOSIS — H2511 Age-related nuclear cataract, right eye: Secondary | ICD-10-CM | POA: Diagnosis not present

## 2018-07-12 DIAGNOSIS — M5136 Other intervertebral disc degeneration, lumbar region: Secondary | ICD-10-CM | POA: Diagnosis not present

## 2018-07-12 DIAGNOSIS — M5442 Lumbago with sciatica, left side: Secondary | ICD-10-CM | POA: Diagnosis not present

## 2018-07-12 DIAGNOSIS — M9903 Segmental and somatic dysfunction of lumbar region: Secondary | ICD-10-CM | POA: Diagnosis not present

## 2018-07-12 DIAGNOSIS — M47816 Spondylosis without myelopathy or radiculopathy, lumbar region: Secondary | ICD-10-CM | POA: Diagnosis not present

## 2018-07-13 DIAGNOSIS — M5136 Other intervertebral disc degeneration, lumbar region: Secondary | ICD-10-CM | POA: Diagnosis not present

## 2018-07-13 DIAGNOSIS — M9903 Segmental and somatic dysfunction of lumbar region: Secondary | ICD-10-CM | POA: Diagnosis not present

## 2018-07-13 DIAGNOSIS — M5442 Lumbago with sciatica, left side: Secondary | ICD-10-CM | POA: Diagnosis not present

## 2018-07-13 DIAGNOSIS — M47816 Spondylosis without myelopathy or radiculopathy, lumbar region: Secondary | ICD-10-CM | POA: Diagnosis not present

## 2018-07-15 DIAGNOSIS — M47816 Spondylosis without myelopathy or radiculopathy, lumbar region: Secondary | ICD-10-CM | POA: Diagnosis not present

## 2018-07-15 DIAGNOSIS — M5442 Lumbago with sciatica, left side: Secondary | ICD-10-CM | POA: Diagnosis not present

## 2018-07-15 DIAGNOSIS — M5136 Other intervertebral disc degeneration, lumbar region: Secondary | ICD-10-CM | POA: Diagnosis not present

## 2018-07-15 DIAGNOSIS — M9903 Segmental and somatic dysfunction of lumbar region: Secondary | ICD-10-CM | POA: Diagnosis not present

## 2018-07-19 DIAGNOSIS — H2512 Age-related nuclear cataract, left eye: Secondary | ICD-10-CM | POA: Diagnosis not present

## 2018-07-19 DIAGNOSIS — H25812 Combined forms of age-related cataract, left eye: Secondary | ICD-10-CM | POA: Diagnosis not present

## 2018-07-29 DIAGNOSIS — M5442 Lumbago with sciatica, left side: Secondary | ICD-10-CM | POA: Diagnosis not present

## 2018-07-29 DIAGNOSIS — M9903 Segmental and somatic dysfunction of lumbar region: Secondary | ICD-10-CM | POA: Diagnosis not present

## 2018-07-29 DIAGNOSIS — M5136 Other intervertebral disc degeneration, lumbar region: Secondary | ICD-10-CM | POA: Diagnosis not present

## 2018-07-29 DIAGNOSIS — M47816 Spondylosis without myelopathy or radiculopathy, lumbar region: Secondary | ICD-10-CM | POA: Diagnosis not present

## 2018-08-12 DIAGNOSIS — M9903 Segmental and somatic dysfunction of lumbar region: Secondary | ICD-10-CM | POA: Diagnosis not present

## 2018-08-12 DIAGNOSIS — M5136 Other intervertebral disc degeneration, lumbar region: Secondary | ICD-10-CM | POA: Diagnosis not present

## 2018-08-12 DIAGNOSIS — M5442 Lumbago with sciatica, left side: Secondary | ICD-10-CM | POA: Diagnosis not present

## 2018-08-12 DIAGNOSIS — M47816 Spondylosis without myelopathy or radiculopathy, lumbar region: Secondary | ICD-10-CM | POA: Diagnosis not present

## 2018-08-19 DIAGNOSIS — M9903 Segmental and somatic dysfunction of lumbar region: Secondary | ICD-10-CM | POA: Diagnosis not present

## 2018-08-19 DIAGNOSIS — M47816 Spondylosis without myelopathy or radiculopathy, lumbar region: Secondary | ICD-10-CM | POA: Diagnosis not present

## 2018-08-19 DIAGNOSIS — M5442 Lumbago with sciatica, left side: Secondary | ICD-10-CM | POA: Diagnosis not present

## 2018-08-19 DIAGNOSIS — M5136 Other intervertebral disc degeneration, lumbar region: Secondary | ICD-10-CM | POA: Diagnosis not present

## 2018-09-14 DIAGNOSIS — M5442 Lumbago with sciatica, left side: Secondary | ICD-10-CM | POA: Diagnosis not present

## 2018-09-14 DIAGNOSIS — M5136 Other intervertebral disc degeneration, lumbar region: Secondary | ICD-10-CM | POA: Diagnosis not present

## 2018-09-14 DIAGNOSIS — M47816 Spondylosis without myelopathy or radiculopathy, lumbar region: Secondary | ICD-10-CM | POA: Diagnosis not present

## 2018-09-14 DIAGNOSIS — M9903 Segmental and somatic dysfunction of lumbar region: Secondary | ICD-10-CM | POA: Diagnosis not present

## 2018-09-21 DIAGNOSIS — M5442 Lumbago with sciatica, left side: Secondary | ICD-10-CM | POA: Diagnosis not present

## 2018-09-21 DIAGNOSIS — M47816 Spondylosis without myelopathy or radiculopathy, lumbar region: Secondary | ICD-10-CM | POA: Diagnosis not present

## 2018-09-21 DIAGNOSIS — M5136 Other intervertebral disc degeneration, lumbar region: Secondary | ICD-10-CM | POA: Diagnosis not present

## 2018-09-21 DIAGNOSIS — M9903 Segmental and somatic dysfunction of lumbar region: Secondary | ICD-10-CM | POA: Diagnosis not present

## 2018-09-23 DIAGNOSIS — H02834 Dermatochalasis of left upper eyelid: Secondary | ICD-10-CM | POA: Diagnosis not present

## 2018-09-23 DIAGNOSIS — H57813 Brow ptosis, bilateral: Secondary | ICD-10-CM | POA: Diagnosis not present

## 2018-09-23 DIAGNOSIS — H02832 Dermatochalasis of right lower eyelid: Secondary | ICD-10-CM | POA: Diagnosis not present

## 2018-09-23 DIAGNOSIS — H02831 Dermatochalasis of right upper eyelid: Secondary | ICD-10-CM | POA: Diagnosis not present

## 2018-09-23 DIAGNOSIS — H02835 Dermatochalasis of left lower eyelid: Secondary | ICD-10-CM | POA: Diagnosis not present

## 2018-10-12 DIAGNOSIS — M9903 Segmental and somatic dysfunction of lumbar region: Secondary | ICD-10-CM | POA: Diagnosis not present

## 2018-10-12 DIAGNOSIS — M5136 Other intervertebral disc degeneration, lumbar region: Secondary | ICD-10-CM | POA: Diagnosis not present

## 2018-10-12 DIAGNOSIS — M5442 Lumbago with sciatica, left side: Secondary | ICD-10-CM | POA: Diagnosis not present

## 2018-10-12 DIAGNOSIS — M47816 Spondylosis without myelopathy or radiculopathy, lumbar region: Secondary | ICD-10-CM | POA: Diagnosis not present

## 2018-10-21 DIAGNOSIS — M5442 Lumbago with sciatica, left side: Secondary | ICD-10-CM | POA: Diagnosis not present

## 2018-10-21 DIAGNOSIS — M9903 Segmental and somatic dysfunction of lumbar region: Secondary | ICD-10-CM | POA: Diagnosis not present

## 2018-10-21 DIAGNOSIS — M47816 Spondylosis without myelopathy or radiculopathy, lumbar region: Secondary | ICD-10-CM | POA: Diagnosis not present

## 2018-10-21 DIAGNOSIS — M5136 Other intervertebral disc degeneration, lumbar region: Secondary | ICD-10-CM | POA: Diagnosis not present

## 2018-10-27 ENCOUNTER — Telehealth: Payer: Self-pay | Admitting: Obstetrics & Gynecology

## 2018-10-27 ENCOUNTER — Other Ambulatory Visit: Payer: Self-pay | Admitting: Obstetrics & Gynecology

## 2018-10-27 MED ORDER — PAROXETINE HCL 10 MG PO TABS: 10.0000 mg | ORAL_TABLET | Freq: Every day | ORAL | 4 refills | Status: DC

## 2018-10-27 NOTE — Telephone Encounter (Signed)
Call to patient. Patient states the 20 mg dose of Paxil made her feel "foggy" and unable to sleep at night. Patient states she was having to take melatonin at night to sleep "and I don't want to do that anymore." Since the beginning of October, patient states she has been cutting pills in half and taking 10 mg to see if that helped and she states she feels more alert. Patient asking for new prescription for 10 mg [facility] [address] RN advised would send request to Dr. Sabra Heck and return call with any additional recommendations. Patient agreeable.   Routing to provider for review.

## 2018-10-27 NOTE — Telephone Encounter (Signed)
Rx sent to pharmacy on file for 10mg  dosage.  #90/4RF.  Ok to close encounter.

## 2018-10-27 NOTE — Telephone Encounter (Signed)
Message left to return call to Triage Nurse at [phone].    

## 2018-10-27 NOTE — Telephone Encounter (Signed)
Call to patient. Advised patient prescription sent to pharmacy. Patient verbalized understanding and appreciative of phone call.   Will close encounter.

## 2018-10-27 NOTE — Telephone Encounter (Signed)
Patient is calling regarding changing dosage of Paxil. Patient would like to start taking 10MG  instead of 20MG .

## 2018-11-05 DIAGNOSIS — M5442 Lumbago with sciatica, left side: Secondary | ICD-10-CM | POA: Diagnosis not present

## 2018-11-05 DIAGNOSIS — M47816 Spondylosis without myelopathy or radiculopathy, lumbar region: Secondary | ICD-10-CM | POA: Diagnosis not present

## 2018-11-05 DIAGNOSIS — M5136 Other intervertebral disc degeneration, lumbar region: Secondary | ICD-10-CM | POA: Diagnosis not present

## 2018-11-05 DIAGNOSIS — M9903 Segmental and somatic dysfunction of lumbar region: Secondary | ICD-10-CM | POA: Diagnosis not present

## 2018-11-18 DIAGNOSIS — M5136 Other intervertebral disc degeneration, lumbar region: Secondary | ICD-10-CM | POA: Diagnosis not present

## 2018-11-18 DIAGNOSIS — M9903 Segmental and somatic dysfunction of lumbar region: Secondary | ICD-10-CM | POA: Diagnosis not present

## 2018-11-18 DIAGNOSIS — M47816 Spondylosis without myelopathy or radiculopathy, lumbar region: Secondary | ICD-10-CM | POA: Diagnosis not present

## 2018-11-18 DIAGNOSIS — M5442 Lumbago with sciatica, left side: Secondary | ICD-10-CM | POA: Diagnosis not present

## 2018-12-07 DIAGNOSIS — R69 Illness, unspecified: Secondary | ICD-10-CM | POA: Diagnosis not present

## 2018-12-07 DIAGNOSIS — M150 Primary generalized (osteo)arthritis: Secondary | ICD-10-CM | POA: Diagnosis not present

## 2018-12-07 DIAGNOSIS — M790 Rheumatism, unspecified: Secondary | ICD-10-CM | POA: Diagnosis not present

## 2018-12-09 DIAGNOSIS — M9903 Segmental and somatic dysfunction of lumbar region: Secondary | ICD-10-CM | POA: Diagnosis not present

## 2018-12-09 DIAGNOSIS — M5442 Lumbago with sciatica, left side: Secondary | ICD-10-CM | POA: Diagnosis not present

## 2018-12-09 DIAGNOSIS — M47816 Spondylosis without myelopathy or radiculopathy, lumbar region: Secondary | ICD-10-CM | POA: Diagnosis not present

## 2018-12-09 DIAGNOSIS — M5136 Other intervertebral disc degeneration, lumbar region: Secondary | ICD-10-CM | POA: Diagnosis not present

## 2018-12-27 DIAGNOSIS — M9903 Segmental and somatic dysfunction of lumbar region: Secondary | ICD-10-CM | POA: Diagnosis not present

## 2018-12-27 DIAGNOSIS — M47816 Spondylosis without myelopathy or radiculopathy, lumbar region: Secondary | ICD-10-CM | POA: Diagnosis not present

## 2018-12-27 DIAGNOSIS — M5136 Other intervertebral disc degeneration, lumbar region: Secondary | ICD-10-CM | POA: Diagnosis not present

## 2018-12-27 DIAGNOSIS — M5442 Lumbago with sciatica, left side: Secondary | ICD-10-CM | POA: Diagnosis not present

## 2019-01-18 DIAGNOSIS — R635 Abnormal weight gain: Secondary | ICD-10-CM | POA: Diagnosis not present

## 2019-01-18 DIAGNOSIS — R5383 Other fatigue: Secondary | ICD-10-CM | POA: Diagnosis not present

## 2019-01-18 DIAGNOSIS — E7800 Pure hypercholesterolemia, unspecified: Secondary | ICD-10-CM | POA: Diagnosis not present

## 2019-01-18 DIAGNOSIS — Z79899 Other long term (current) drug therapy: Secondary | ICD-10-CM | POA: Diagnosis not present

## 2019-01-18 DIAGNOSIS — Z0000 Encounter for general adult medical examination without abnormal findings: Secondary | ICD-10-CM | POA: Diagnosis not present

## 2019-01-19 LAB — CBC AND DIFFERENTIAL
HCT: 43 (ref 36–46)
Hemoglobin: 14.3 (ref 12.0–16.0)
Platelets: 224 (ref 150–399)
WBC: 5.6

## 2019-01-19 LAB — BASIC METABOLIC PANEL
BUN: 13 (ref 4–21)
CO2: 25 — AB (ref 13–22)
Chloride: 102 (ref 99–108)
Creatinine: 0.7 (ref 0.5–1.1)
Glucose: 80
Potassium: 4 (ref 3.4–5.3)
Sodium: 140 (ref 137–147)

## 2019-01-19 LAB — LIPID PANEL
Cholesterol: 216 — AB (ref 0–200)
HDL: 86 — AB (ref 35–70)
LDL Cholesterol: 120
LDl/HDL Ratio: 1.4
Triglycerides: 54 (ref 40–160)

## 2019-01-19 LAB — TSH: TSH: 5.61 (ref 0.41–5.90)

## 2019-01-19 LAB — HEMOGLOBIN A1C: Hemoglobin A1C: 5.3

## 2019-01-19 LAB — CBC: RBC: 4.64 (ref 3.87–5.11)

## 2019-01-19 LAB — HEPATIC FUNCTION PANEL
ALT: 22 (ref 7–35)
AST: 19 (ref 13–35)
Alkaline Phosphatase: 65 (ref 25–125)

## 2019-01-19 LAB — COMPREHENSIVE METABOLIC PANEL
Albumin: 4.2 (ref 3.5–5.0)
Calcium: 8.8 (ref 8.7–10.7)
Globulin: 2

## 2019-01-25 ENCOUNTER — Encounter: Payer: Self-pay | Admitting: Family Medicine

## 2019-01-25 DIAGNOSIS — E039 Hypothyroidism, unspecified: Secondary | ICD-10-CM | POA: Diagnosis not present

## 2019-01-25 DIAGNOSIS — M159 Polyosteoarthritis, unspecified: Secondary | ICD-10-CM | POA: Diagnosis not present

## 2019-01-25 DIAGNOSIS — Z0000 Encounter for general adult medical examination without abnormal findings: Secondary | ICD-10-CM | POA: Diagnosis not present

## 2019-01-25 DIAGNOSIS — M67441 Ganglion, right hand: Secondary | ICD-10-CM | POA: Diagnosis not present

## 2019-01-25 DIAGNOSIS — R635 Abnormal weight gain: Secondary | ICD-10-CM | POA: Diagnosis not present

## 2019-01-25 DIAGNOSIS — M8580 Other specified disorders of bone density and structure, unspecified site: Secondary | ICD-10-CM | POA: Diagnosis not present

## 2019-01-25 DIAGNOSIS — R5383 Other fatigue: Secondary | ICD-10-CM | POA: Diagnosis not present

## 2019-01-25 DIAGNOSIS — Z780 Asymptomatic menopausal state: Secondary | ICD-10-CM | POA: Diagnosis not present

## 2019-01-27 DIAGNOSIS — M5442 Lumbago with sciatica, left side: Secondary | ICD-10-CM | POA: Diagnosis not present

## 2019-01-27 DIAGNOSIS — M47816 Spondylosis without myelopathy or radiculopathy, lumbar region: Secondary | ICD-10-CM | POA: Diagnosis not present

## 2019-01-27 DIAGNOSIS — M5136 Other intervertebral disc degeneration, lumbar region: Secondary | ICD-10-CM | POA: Diagnosis not present

## 2019-01-27 DIAGNOSIS — M9903 Segmental and somatic dysfunction of lumbar region: Secondary | ICD-10-CM | POA: Diagnosis not present

## 2019-02-02 ENCOUNTER — Telehealth: Payer: Self-pay | Admitting: Obstetrics & Gynecology

## 2019-02-02 NOTE — Telephone Encounter (Signed)
Left message to call Glendene Wyer, RN at [facility] [phone].   

## 2019-02-02 NOTE — Telephone Encounter (Signed)
Patient has some questions for Dr.Miller's nurse about her antidepressant medication.

## 2019-02-03 NOTE — Telephone Encounter (Signed)
Spoke with patient. Patient wants to update medications. Patient reports she is no longer taking Paxil, stopped 1 month ago, is doing well. Patient states she will call if any concerns. Will see Dr. Sabra Heck for AEX 07/19/19. Questions answered.   Routing to provider for final review. Patient is agreeable to disposition. Will close encounter.

## 2019-02-10 ENCOUNTER — Telehealth: Payer: Self-pay | Admitting: Obstetrics & Gynecology

## 2019-02-10 DIAGNOSIS — M79621 Pain in right upper arm: Secondary | ICD-10-CM | POA: Diagnosis not present

## 2019-02-10 NOTE — Telephone Encounter (Signed)
Patient returned a call to Jill.   

## 2019-02-10 NOTE — Telephone Encounter (Signed)
Tara Fernandez  Patient Appointment Schedule Request Pool 4 minutes ago (7:51 AM)   Appointment Request From: Tara Fernandez  With Provider: Megan Salon, MD [facility]]  Preferred Date Range: Any  Preferred Times: Any Time  Reason for visit: Office Visit  Comments: Good Morning [name] Could you please give me the name of the women doctor you go to in [place]. You did at one point but can't find it. Hope you are doing well Thank you Tara Fernandez

## 2019-02-10 NOTE — Telephone Encounter (Signed)
Spoke with patient. Reviewed PCP options again with patient.  Questions answered.   Encounter closed.

## 2019-02-10 NOTE — Telephone Encounter (Signed)
Call to patient, left detailed message, ok per dpr. Left recommendations for  [facility] and their contact information. Advised patient to return call to office if any additional questions or assistance needed.   Routing to provider for final review. Patient is agreeable to disposition. Will close encounter.

## 2019-02-14 DIAGNOSIS — M79621 Pain in right upper arm: Secondary | ICD-10-CM | POA: Diagnosis not present

## 2019-02-15 ENCOUNTER — Other Ambulatory Visit: Payer: Self-pay

## 2019-02-16 ENCOUNTER — Encounter: Payer: Self-pay | Admitting: Family Medicine

## 2019-02-16 ENCOUNTER — Ambulatory Visit (INDEPENDENT_AMBULATORY_CARE_PROVIDER_SITE_OTHER): Payer: Medicare HMO | Admitting: Family Medicine

## 2019-02-16 VITALS — BP 110/70 | HR 86 | Resp 12 | Ht 62.0 in | Wt 135.0 lb

## 2019-02-16 DIAGNOSIS — F419 Anxiety disorder, unspecified: Secondary | ICD-10-CM

## 2019-02-16 DIAGNOSIS — R739 Hyperglycemia, unspecified: Secondary | ICD-10-CM

## 2019-02-16 DIAGNOSIS — R69 Illness, unspecified: Secondary | ICD-10-CM | POA: Diagnosis not present

## 2019-02-16 MED ORDER — FLUOXETINE HCL 10 MG PO CAPS: 10.0000 mg | ORAL_CAPSULE | Freq: Every day | ORAL | 1 refills | Status: DC

## 2019-02-16 NOTE — Patient Instructions (Signed)
A few things to remember from today's visit:   No diagnosis found.  Today we started Fluoxetine, this type of medications can increase suicidal risk. This is more prevalent among children,adolecents, and young adults with major depression or other psychiatric disorders. It can also make depression worse. Most common side effects are gastrointestinal, self limited after a few weeks: diarrhea, nausea, constipation  Or diarrhea among some.  In general it is well tolerated. We will follow closely.

## 2019-02-16 NOTE — Progress Notes (Signed)
HPI:   [name] is a 69 y.o. female, who is here today to establish care.  Former PCP: Dr Ashby Dawes. Last preventive routine visit: 01/2019.  Chronic medical problems: Depression,anxiety,and OA among some.  Concerns today: Anxiety.  Years hx of anxiety and depression. Currently she is more concerned about anxiety,worse for the past 10 months, when she started taking care of her mother. Her siblings are not supporting her. If she is not around her mother,she feels better.She can be "mean" sometimes.  She was on Paxil years ago and asked to start again in 12/2018. She discontinued medication because she was sleepy during the day,taking naps, so she could not sleep at night. Also caused wt gain and constipation. She tried "natural treatments but did not help.  She tried Sertraline and it caused side effects.  Denies depressed mood or suicidal thoughts.  She lives with her husband. Works full time. She exercises regularly, yoga and pilates. She follows a healthful diet.  No hx of HLD,diabetes,CAD,or HTN.  Reviewing labs her glucose has been elevated, 130,182 in 2017 and 2015 respectively. Denies polydipsia,polyuria, or polyphagia.   Review of Systems  Constitutional: Positive for fatigue. Negative for activity change, appetite change and fever.  HENT: Negative for mouth sores, nosebleeds and sore throat.   Respiratory: Negative for cough, shortness of breath and wheezing.   Cardiovascular: Negative for chest pain, palpitations and leg swelling.  Gastrointestinal: Negative for abdominal pain, nausea and vomiting.       Negative for changes in bowel habits.  Genitourinary: Negative for decreased urine volume and hematuria.  Musculoskeletal: Positive for arthralgias. Negative for gait problem and myalgias.  Neurological: Negative for syncope, weakness and headaches.  Psychiatric/Behavioral: Negative for confusion and hallucinations.  Rest see pertinent  positives and negatives per HPI.   Current Outpatient Medications on File Prior to Visit  Medication Sig Dispense Refill  . ibuprofen (ADVIL,MOTRIN) 200 MG tablet Take 200 mg by mouth every 6 (six) hours as needed.     No current facility-administered medications on file prior to visit.    Past Medical History:  Diagnosis Date  . Arthritis    osteoarthritis; PAIN RIGHT HIP  . PONV (postoperative nausea and vomiting)    Allergies  Allergen Reactions  . Tramadol Other (See Comments)    Causes numbness in hands and right side of body  . Chlorhexidine Itching  . Other     PT STATES SHE CAN TAKE HYDROCODONE/ACETAMINOPHEN FOR PAIN  WITH OUT ANY PROBLEMS.  STATES - IN THE PAST - OTHER PAIN MEDS CAUSED NAUSEA OR RASH OR FAST HEART BEAT - BUT SHE DOESN'T KNOW NAMES OF THOSE MEDS   STATES NO PROBLEMS WITH ANY PAIN MEDS GIVEN IN HOSPITAL.  Marland Kitchen Premarin [Conjugated Estrogens] Other (See Comments)    Headache, dizziness, and pelvic cramping.  . Ciprofloxacin Hives, Itching and Rash    "joint pain", patient reports hives, rash, and itching  . Latex Rash    HIVES - AFTER DENTIST USED LATEX GLOVES    Family History  Problem Relation Age of Onset  . Heart attack Father   . Breast cancer Sister 30  . Lung cancer Sister   . Lung cancer Mother     Social History   Socioeconomic History  . Marital status: Married    Spouse name: Not on file  . Number of children: Not on file  . Years of education: Not on file  . Highest education level: Not on  file  Occupational History  . Not on file  Tobacco Use  . Smoking status: Never Smoker  . Smokeless tobacco: Never Used  Substance and Sexual Activity  . Alcohol use: Yes    Alcohol/week: 0.0 standard drinks    Comment: occasional WINE  . Drug use: No  . Sexual activity: Yes    Partners: Male    Birth control/protection: Post-menopausal  Other Topics Concern  . Not on file  Social History Narrative  . Not on file   Social Determinants  of Health   Financial Resource Strain:   . Difficulty of Paying Living Expenses: Not on file  Food Insecurity:   . Worried About Charity fundraiser in the Last Year: Not on file  . Ran Out of Food in the Last Year: Not on file  Transportation Needs:   . Lack of Transportation (Medical): Not on file  . Lack of Transportation (Non-Medical): Not on file  Physical Activity:   . Days of Exercise per Week: Not on file  . Minutes of Exercise per Session: Not on file  Stress:   . Feeling of Stress : Not on file  Social Connections:   . Frequency of Communication with Friends and Family: Not on file  . Frequency of Social Gatherings with Friends and Family: Not on file  . Attends Religious Services: Not on file  . Active Member of Clubs or Organizations: Not on file  . Attends Archivist Meetings: Not on file  . Marital Status: Not on file    Vitals:   02/16/19 1356  BP: 110/70  Pulse: 86  Resp: 12  SpO2: 98%    Body mass index is 24.69 kg/m.  Physical Exam  Nursing note and vitals reviewed. Constitutional: She is oriented to person, place, and time. She appears well-developed. No distress.  HENT:  Head: Normocephalic and atraumatic.  Mouth/Throat: Oropharynx is clear and moist and mucous membranes are normal.  Eyes: Pupils are equal, round, and reactive to light. Conjunctivae are normal.  Cardiovascular: Normal rate and regular rhythm.  No murmur heard. Pulses:      Dorsalis pedis pulses are 2+ on the right side and 2+ on the left side.  Respiratory: Effort normal and breath sounds normal. No respiratory distress.  GI: Soft. She exhibits no mass. There is no hepatomegaly. There is no abdominal tenderness.  Musculoskeletal:        General: No edema.  Lymphadenopathy:    She has no cervical adenopathy.  Neurological: She is alert and oriented to person, place, and time. She has normal strength. No cranial nerve deficit. Gait normal.  Skin: Skin is warm. No rash  noted. No erythema.  Psychiatric: Her mood appears anxious.  Well groomed, good eye contact.   ASSESSMENT AND PLAN:  Ms. Tara Fernandez was seen today for establish care.  Diagnoses and all orders for this visit:  Anxiety disorder, unspecified type After discussing pharmacologic treatment options,side effects,and affectivity she agrees with trying Fluoxetine low dose. Psychotherapy may also help,she is not interested at this time. Instructed about warning signs.  -     FLUoxetine (PROZAC) 10 MG capsule; Take 1 capsule (10 mg total) by mouth daily.  Hyperglycemia Will plan on A1C next visit. Continue a healthy life style for diabetes prevention.  At this time I do not have records from former PCP. So we will hold on blood work for now.   Return in about 6 weeks (around 03/30/2019) for anxiety.  Jsean Taussig G. [place], MD  [facility]. [place] office.

## 2019-02-17 ENCOUNTER — Telehealth: Payer: Self-pay | Admitting: Family Medicine

## 2019-02-17 DIAGNOSIS — M79621 Pain in right upper arm: Secondary | ICD-10-CM | POA: Diagnosis not present

## 2019-02-17 NOTE — Telephone Encounter (Signed)
Pt was prescribed antidepressants. Pt decided she will not take it.  Pt stated she has been on antidepressants before and her side effects were can't sleep, decrease in libido, and can't have a glass of wine.  She said once [place] receives her medical history they can discuss further on what they can do b/c the pt says she is not depressed but has anxiety.   Pt can be reached at [phone] if needed.

## 2019-02-18 ENCOUNTER — Other Ambulatory Visit: Payer: Self-pay

## 2019-02-18 ENCOUNTER — Ambulatory Visit: Payer: Medicare HMO | Admitting: Podiatry

## 2019-02-18 VITALS — Temp 97.3°F

## 2019-02-18 DIAGNOSIS — M79676 Pain in unspecified toe(s): Secondary | ICD-10-CM

## 2019-02-18 DIAGNOSIS — L603 Nail dystrophy: Secondary | ICD-10-CM

## 2019-02-18 DIAGNOSIS — L600 Ingrowing nail: Secondary | ICD-10-CM

## 2019-02-18 MED ORDER — FLUCONAZOLE 150 MG PO TABS: 150.0000 mg | ORAL_TABLET | ORAL | 1 refills | Status: DC

## 2019-02-18 MED ORDER — NEOMYCIN-POLYMYXIN-HC 3.5-10000-1 OT SOLN: OTIC | 0 refills | Status: DC

## 2019-02-18 NOTE — Patient Instructions (Signed)

## 2019-02-18 NOTE — Telephone Encounter (Signed)
fyi

## 2019-02-25 ENCOUNTER — Ambulatory Visit: Payer: Medicare HMO | Admitting: Podiatry

## 2019-02-28 DIAGNOSIS — M79621 Pain in right upper arm: Secondary | ICD-10-CM | POA: Diagnosis not present

## 2019-03-01 ENCOUNTER — Telehealth: Payer: Self-pay | Admitting: Family Medicine

## 2019-03-01 NOTE — Telephone Encounter (Signed)
ROI fax to [facility] for patient records

## 2019-03-02 ENCOUNTER — Other Ambulatory Visit: Payer: Self-pay

## 2019-03-02 ENCOUNTER — Encounter: Payer: Self-pay | Admitting: Physician Assistant

## 2019-03-02 ENCOUNTER — Ambulatory Visit: Payer: Medicare HMO | Admitting: Physician Assistant

## 2019-03-02 DIAGNOSIS — G8929 Other chronic pain: Secondary | ICD-10-CM | POA: Diagnosis not present

## 2019-03-02 DIAGNOSIS — M25511 Pain in right shoulder: Secondary | ICD-10-CM

## 2019-03-02 MED ORDER — LIDOCAINE HCL 1 % IJ SOLN
3.0000 mL | INTRAMUSCULAR | Status: AC | PRN
  Administered 2019-03-02: 3 mL

## 2019-03-02 MED ORDER — METHYLPREDNISOLONE ACETATE 40 MG/ML IJ SUSP
40.0000 mg | INTRAMUSCULAR | Status: AC | PRN
  Administered 2019-03-02: 40 mg via INTRA_ARTICULAR

## 2019-03-02 NOTE — Progress Notes (Signed)
Office Visit Note   Patient: Tara Fernandez           Date of Birth: 1950-06-05           MRN: [id number] Visit Date: 03/02/2019              Requested by: Fernandez, Tara G, MD [address] [place],  [place] [address] PCP: Fernandez, Tara G, MD   Assessment & Plan: Visit Diagnoses:  1. Chronic right shoulder pain     Plan: She will work on range of motion of the shoulder.  Follow-up with [place] pain persist becomes worse.  She may benefit from intra-articular injections in the shoulder.  Questions were encouraged and answered.  Follow-Up Instructions: Return if symptoms worsen or fail to improve.   Orders:  No orders of the defined types were placed in this encounter.  No orders of the defined types were placed in this encounter.     Procedures: Large Joint Inj: R subacromial bursa on 03/02/2019 4:56 PM Indications: pain Details: 22 G 1.5 in needle, superior approach  Arthrogram: No  Medications: 3 mL lidocaine 1 %; 40 mg methylPREDNISolone acetate 40 MG/ML Outcome: tolerated well, no immediate complications Procedure, treatment alternatives, risks and benefits explained, specific risks discussed. Consent was given by the patient. Immediately prior to procedure a time out was called to verify the correct patient, procedure, equipment, support staff and site/side marked as required. Patient was prepped and draped in the usual sterile fashion.       Clinical Data: No additional findings.   Subjective: Chief Complaint  Patient presents with  . Right Shoulder - Pain    HPI Tara Fernandez comes in today with right shoulder pain.  She was last seen 03/05/2018 and given an injection.  She states that the very good relief.  She had noted an injury to the shoulder.  She feels that she has decreased range of motion especially with reaching behind her back.  She is tried physical therapy massage and ibuprofen without significant relief.  Radiographs at last office visit did  show moderate arthritic changes with osteophytes of the humeral head and narrowing of the glenohumeral joint.  She denies any numbness tingling down the arm.  Review of Systems  Constitutional: Negative for chills and fever.  Respiratory: Negative for shortness of breath.      Objective: Vital Signs: LMP 01/07/1996 (Approximate)   Physical Exam Constitutional:      Appearance: She is normal weight. She is not ill-appearing or diaphoretic.  Pulmonary:     Effort: Pulmonary effort is normal.  Neurological:     Mental Status: She is alert and oriented to person, place, and time.  Psychiatric:        Mood and Affect: Mood normal.     Ortho Exam Full overhead activity both shoulders.  5 and 5 strength with external and internal rotation against resistance.  Empty can test is negative bilaterally.  She does have limited external rotation of the right shoulder.  Negative impingement sign.  No significant crepitus with range of motion of the shoulder. Specialty Comments:  No specialty comments available.  Imaging: No results found.   PMFS History: Patient Active Problem List   Diagnosis Date Noted  . Chronic right shoulder pain 03/02/2019  . Chondromalacia of right patellofemoral joint 05/18/2013  . Leg length discrepancy 04/15/2013  . Arthritis of right hip 02/11/2013  . Status post THR (total hip replacement) 02/11/2013  . Degenerative arthritis of  hip 06/17/2012   Past Medical History:  Diagnosis Date  . Arthritis    osteoarthritis; PAIN RIGHT HIP  . PONV (postoperative nausea and vomiting)     Family History  Problem Relation Age of Onset  . Heart attack Father   . Breast cancer Sister 26  . Lung cancer Sister   . Lung cancer Mother     Past Surgical History:  Procedure Laterality Date  . ANTERIOR AND POSTERIOR REPAIR N/A 03/05/2015   Procedure: ANTERIOR (CYSTOCELE) REPAIR, VAULT PROLAPSE AND GRAFT;  Surgeon: Bjorn Loser, MD;  Location: [facility];  Service:  Urology;  Laterality: N/A;  . BREAST MASS EXCISION     Hx: of left breast  . CARTILAGE SURGERY  10 th grade   RIGHT KNEE  . CYSTOSCOPY N/A 03/05/2015   Procedure: CYSTOSCOPY;  Surgeon: Bjorn Loser, MD;  Location: [facility];  Service: Urology;  Laterality: N/A;  . LAPAROSCOPIC VAGINAL HYSTERECTOMY WITH SALPINGECTOMY Bilateral 03/05/2015   Procedure: LAPAROSCOPIC ASSISTED VAGINAL HYSTERECTOMY WITH SALPINGECTOMY;  Surgeon: Megan Salon, MD;  Location: [facility];  Service: Gynecology;  Laterality: Bilateral;  . TOTAL HIP ARTHROPLASTY Left 06/17/2012   Procedure: LEFT TOTAL HIP ARTHROPLASTY ANTERIOR APPROACH;  Surgeon: Mcarthur Rossetti, MD;  Location: [facility];  Service: Orthopedics;  Laterality: Left;  . TOTAL HIP ARTHROPLASTY Right 02/11/2013   Procedure: RIGHT TOTAL HIP ARTHROPLASTY ANTERIOR APPROACH;  Surgeon: Mcarthur Rossetti, MD;  Location: [facility];  Service: Orthopedics;  Laterality: Right;   Social History   Occupational History  . Not on file  Tobacco Use  . Smoking status: Never Smoker  . Smokeless tobacco: Never Used  Substance and Sexual Activity  . Alcohol use: Yes    Alcohol/week: 0.0 standard drinks    Comment: occasional WINE  . Drug use: No  . Sexual activity: Yes    Partners: Male    Birth control/protection: Post-menopausal

## 2019-03-03 ENCOUNTER — Ambulatory Visit (INDEPENDENT_AMBULATORY_CARE_PROVIDER_SITE_OTHER): Payer: Medicare HMO | Admitting: Podiatry

## 2019-03-03 DIAGNOSIS — L600 Ingrowing nail: Secondary | ICD-10-CM

## 2019-03-03 DIAGNOSIS — M79676 Pain in unspecified toe(s): Secondary | ICD-10-CM | POA: Diagnosis not present

## 2019-03-03 NOTE — Progress Notes (Signed)
  Subjective:  Patient ID: Tara Fernandez, female    DOB: [birth year],  MRN: [id number]  Chief Complaint  Patient presents with  . Nail Problem    Left 1st nail check. Pt states no concerns and is healing well.  . Nail Problem    Pt is taking Diflucan as prescribed and has no concerns.    69 y.o. female presents for follow up of nail procedure. History confirmed with patient. Taking the fluconazole without issue  Objective:  Physical Exam: Ingrown nail avulsion site: overlying soft crust, no warmth, no drainage and no erythema Assessment:   1. Ingrown nail   2. Pain around toenail     Plan:  Patient was evaluated and treated and all questions answered.  S/p Ingrown Toenail Excision, left -Healing well without issue. -Discussed return precautions. -F/u PRN

## 2019-03-03 NOTE — Patient Instructions (Signed)
Check out the GoodRx application  https://www.goodrx.com/

## 2019-03-04 DIAGNOSIS — M79621 Pain in right upper arm: Secondary | ICD-10-CM | POA: Diagnosis not present

## 2019-03-08 ENCOUNTER — Encounter: Payer: Self-pay | Admitting: Family Medicine

## 2019-03-09 DIAGNOSIS — M5442 Lumbago with sciatica, left side: Secondary | ICD-10-CM | POA: Diagnosis not present

## 2019-03-09 DIAGNOSIS — M9903 Segmental and somatic dysfunction of lumbar region: Secondary | ICD-10-CM | POA: Diagnosis not present

## 2019-03-09 DIAGNOSIS — M47816 Spondylosis without myelopathy or radiculopathy, lumbar region: Secondary | ICD-10-CM | POA: Diagnosis not present

## 2019-03-09 DIAGNOSIS — M5136 Other intervertebral disc degeneration, lumbar region: Secondary | ICD-10-CM | POA: Diagnosis not present

## 2019-03-10 ENCOUNTER — Encounter: Payer: Self-pay | Admitting: Obstetrics & Gynecology

## 2019-03-10 DIAGNOSIS — Z1231 Encounter for screening mammogram for malignant neoplasm of breast: Secondary | ICD-10-CM | POA: Diagnosis not present

## 2019-03-21 DIAGNOSIS — M9903 Segmental and somatic dysfunction of lumbar region: Secondary | ICD-10-CM | POA: Diagnosis not present

## 2019-03-21 DIAGNOSIS — M5136 Other intervertebral disc degeneration, lumbar region: Secondary | ICD-10-CM | POA: Diagnosis not present

## 2019-03-21 DIAGNOSIS — M5442 Lumbago with sciatica, left side: Secondary | ICD-10-CM | POA: Diagnosis not present

## 2019-03-21 DIAGNOSIS — M47816 Spondylosis without myelopathy or radiculopathy, lumbar region: Secondary | ICD-10-CM | POA: Diagnosis not present

## 2019-03-23 DIAGNOSIS — M5442 Lumbago with sciatica, left side: Secondary | ICD-10-CM | POA: Diagnosis not present

## 2019-03-23 DIAGNOSIS — M5136 Other intervertebral disc degeneration, lumbar region: Secondary | ICD-10-CM | POA: Diagnosis not present

## 2019-03-23 DIAGNOSIS — M9903 Segmental and somatic dysfunction of lumbar region: Secondary | ICD-10-CM | POA: Diagnosis not present

## 2019-03-23 DIAGNOSIS — M47816 Spondylosis without myelopathy or radiculopathy, lumbar region: Secondary | ICD-10-CM | POA: Diagnosis not present

## 2019-03-30 DIAGNOSIS — M47816 Spondylosis without myelopathy or radiculopathy, lumbar region: Secondary | ICD-10-CM | POA: Diagnosis not present

## 2019-03-30 DIAGNOSIS — M5136 Other intervertebral disc degeneration, lumbar region: Secondary | ICD-10-CM | POA: Diagnosis not present

## 2019-03-30 DIAGNOSIS — M9903 Segmental and somatic dysfunction of lumbar region: Secondary | ICD-10-CM | POA: Diagnosis not present

## 2019-03-30 DIAGNOSIS — M5442 Lumbago with sciatica, left side: Secondary | ICD-10-CM | POA: Diagnosis not present

## 2019-04-01 ENCOUNTER — Other Ambulatory Visit: Payer: Self-pay

## 2019-04-04 ENCOUNTER — Encounter: Payer: Self-pay | Admitting: Family Medicine

## 2019-04-04 ENCOUNTER — Ambulatory Visit (INDEPENDENT_AMBULATORY_CARE_PROVIDER_SITE_OTHER): Payer: Medicare HMO | Admitting: Family Medicine

## 2019-04-04 ENCOUNTER — Other Ambulatory Visit: Payer: Self-pay

## 2019-04-04 VITALS — BP 110/66 | HR 74 | Temp 98.1°F | Resp 12 | Ht 62.0 in | Wt 138.0 lb

## 2019-04-04 DIAGNOSIS — E663 Overweight: Secondary | ICD-10-CM | POA: Diagnosis not present

## 2019-04-04 DIAGNOSIS — F419 Anxiety disorder, unspecified: Secondary | ICD-10-CM | POA: Diagnosis not present

## 2019-04-04 DIAGNOSIS — R69 Illness, unspecified: Secondary | ICD-10-CM | POA: Diagnosis not present

## 2019-04-04 NOTE — Patient Instructions (Signed)
A few things to remember from today's visit:  Try 1800 cal daily. Continue regular physical activity. Fall precautions.. Continue SAMe supplementation.

## 2019-04-04 NOTE — Progress Notes (Signed)
HPI:   Tara Fernandez is a 69 y.o. female, who is here today to follow on recent OV. She was last seen on 02/16/19, when she was c/o worsening anxiety. She was on Paxil before, stopped because was causing drowsiness.  Last visit Fluoxetine was recommended.  She discontinued fluoxetine because medications caused constipation and insomnia. She started taking OTC supplement, SAMe 400 mg daily, and she has noticed great improvement in mood. She is sleeping better, about 8 hours.  She is also frustrated about weight gain for the past 2 to 3 years, after having hysterectomy. She exercises regularly and follows a healthful diet.  Negative for fever,chills,myalgias,unusual fatigue,abdominal pain,N/V,tremor,or cold/heat intolerance.  Review of Systems  Constitutional: Positive for fatigue. Negative for activity change and appetite change.  Respiratory: Negative for shortness of breath and wheezing.   Cardiovascular: Negative for chest pain, palpitations and leg swelling.  Endocrine: Negative for polydipsia, polyphagia and polyuria.  Musculoskeletal: Negative for gait problem and myalgias.  Skin: Negative for pallor and rash.  Neurological: Negative for syncope and headaches.  Rest see pertinent positives and negatives per HPI.  Current Outpatient Medications on File Prior to Visit  Medication Sig Dispense Refill  . ibuprofen (ADVIL,MOTRIN) 200 MG tablet Take 200 mg by mouth every 6 (six) hours as needed.     No current facility-administered medications on file prior to visit.     Past Medical History:  Diagnosis Date  . Arthritis    osteoarthritis; PAIN RIGHT HIP  . PONV (postoperative nausea and vomiting)    Allergies  Allergen Reactions  . Tramadol Other (See Comments)    Causes numbness in hands and right side of body  . Chlorhexidine Itching  . Other      PT STATES SHE CAN TAKE HYDROCODONE/ACETAMINOPHEN FOR PAIN  WITH OUT ANY PROBLEMS.  STATES - IN THE PAST - OTHER PAIN MEDS CAUSED NAUSEA OR RASH OR FAST HEART BEAT - BUT SHE DOESN'T KNOW NAMES OF THOSE MEDS   STATES NO PROBLEMS WITH ANY PAIN MEDS GIVEN IN HOSPITAL.  Marland Kitchen Premarin [Conjugated Estrogens] Other (See Comments)    Headache, dizziness, and pelvic cramping.  . Ciprofloxacin Hives, Itching and Rash    "joint pain", patient reports hives, rash, and itching  . Latex Rash    HIVES - AFTER DENTIST USED LATEX GLOVES    Social History   Socioeconomic History  . Marital status: Married    Spouse name: Not on file  . Number of children: Not on file  . Years of education: Not on file  . Highest education level: Not on file  Occupational History  . Not on file  Tobacco Use  . Smoking status: Never Smoker  . Smokeless tobacco: Never Used  Substance and Sexual Activity  . Alcohol use: Yes    Alcohol/week: 0.0 standard drinks    Comment: occasional WINE  . Drug use: No  . Sexual activity: Yes    Partners: Male    Birth control/protection: Post-menopausal  Other Topics Concern  . Not on file  Social History Narrative  . Not on file   Social Determinants of Health   Financial Resource Strain:   . Difficulty of Paying Living Expenses:   Food Insecurity:   . Worried About Charity fundraiser in the Last Year:   . Arboriculturist in the Last Year:   Transportation Needs:   . Film/video editor (Medical):   Marland Kitchen Lack of Transportation (Non-Medical):  Physical Activity:   . Days of Exercise per Week:   . Minutes of Exercise per Session:   Stress:   . Feeling of Stress :   Social Connections:   . Frequency of Communication with Friends and Family:   . Frequency of Social Gatherings with Friends and Family:   . Attends Religious Services:   . Active Member of Clubs or Organizations:   . Attends Archivist Meetings:   Marland Kitchen Marital Status:     Vitals:   04/04/19  1023  BP: 110/66  Pulse: 74  Resp: 12  Temp: 98.1 F (36.7 C)  SpO2: 97%   Wt Readings from Last 3 Encounters:  04/04/19 138 lb (62.6 kg)  02/16/19 135 lb (61.2 kg)  02/26/18 129 lb (58.5 kg)   Body mass index is 25.24 kg/m.  Physical Exam  Nursing note and vitals reviewed. Constitutional: She is oriented to person, place, and time. She appears well-developed and well-nourished. No distress.  HENT:  Head: Normocephalic.  Mouth/Throat: Oropharynx is clear and moist and mucous membranes are normal.  Eyes: Pupils are equal, round, and reactive to light. Conjunctivae and EOM are normal.  Cardiovascular: Normal rate and regular rhythm.  No murmur heard. Respiratory: Effort normal and breath sounds normal. No respiratory distress.  Musculoskeletal:        General: No edema.  Neurological: She is alert and oriented to person, place, and time. She has normal strength. Gait normal.  Skin: Skin is warm. No erythema.  Psychiatric: She has a normal mood and affect.  Well groomed, good eye contact.   ASSESSMENT AND PLAN:  Ms. Tara Fernandez was seen today for follow-up.  Diagnoses and all orders for this visit:  Anxiety disorder, unspecified type Problem has improved with OTC supplement, she is not interested in adding medication.  Overweight with body mass index (BMI) 25.0-29.9 About 3 Lb wt gain. Encouraged to continue healthy life style. She is eating several serving of fruit, this could be adding some calories. She can start counting cal, 1800 cl/day.   Return in about 42 weeks (around 01/23/2020) for cpe.   Tara Fernandez G. [place], MD  [facility]. [place] office.

## 2019-04-10 NOTE — Progress Notes (Signed)
  Subjective:  Patient ID: Tara Fernandez, female    DOB: November 02, 1950,  MRN: [id number]  Chief Complaint  Patient presents with  . Nail Problem    ? Ingrown nail, L hallux - medial border. x2-3 weeks. Pt stated, "I was told that I had an ingrown nail while I was having a pedicure. No pain while walking, but it's very sensitive - hurts if my bedsheets touch it. No drainage or bleeding. I've used peroxide and tea tree oil".  . Nail Problem    Discoloration. R hallux. x"Years - it comes and goes".    69 y.o. female presents with the above complaint. History confirmed with patient.   Objective:  Physical Exam: warm, good capillary refill, no trophic changes or ulcerative lesions, normal DP and PT pulses and normal sensory exam.  Painful ingrowing nail at  medial border of the left, hallux; without warmth, erythema or drainage  Assessment:  No diagnosis found.   Plan:  Patient was evaluated and treated and all questions answered.  Ingrown Nail, left -Patient elects to proceed with ingrown toenail removal today -Ingrown nail excised. See procedure note. -Educated on post-procedure care including soaking. Written instructions provided. -Rx for Cortisporin drops -Patient to follow up in 2 weeks for nail check. -Rx fluconazole for nail dystrophy  Procedure: Excision of Ingrown Toenail Location: Left 1st toe medial nail borders. Anesthesia: Lidocaine 1% plain; 1.46mL and Marcaine 0.5% plain; 1.64mL, digital block. Skin Prep: Betadine. Dressing: Silvadene; telfa; dry, sterile, compression dressing. Technique: Following skin prep, the toe was exsanguinated and a tourniquet was secured at the base of the toe. The affected nail border was freed, split with a nail splitter, and excised. Chemical matrixectomy was then performed with phenol and irrigated out with alcohol. The tourniquet was then removed and sterile dressing applied. Disposition: Patient tolerated procedure well. Patient to return  in 2 weeks for follow-up.  Return in about 2 weeks (around 03/04/2019) for Nail Check.   MDM

## 2019-04-14 DIAGNOSIS — M5442 Lumbago with sciatica, left side: Secondary | ICD-10-CM | POA: Diagnosis not present

## 2019-04-14 DIAGNOSIS — M47816 Spondylosis without myelopathy or radiculopathy, lumbar region: Secondary | ICD-10-CM | POA: Diagnosis not present

## 2019-04-14 DIAGNOSIS — M9903 Segmental and somatic dysfunction of lumbar region: Secondary | ICD-10-CM | POA: Diagnosis not present

## 2019-04-14 DIAGNOSIS — M5136 Other intervertebral disc degeneration, lumbar region: Secondary | ICD-10-CM | POA: Diagnosis not present

## 2019-04-18 DIAGNOSIS — M9903 Segmental and somatic dysfunction of lumbar region: Secondary | ICD-10-CM | POA: Diagnosis not present

## 2019-04-18 DIAGNOSIS — M5136 Other intervertebral disc degeneration, lumbar region: Secondary | ICD-10-CM | POA: Diagnosis not present

## 2019-04-18 DIAGNOSIS — M5442 Lumbago with sciatica, left side: Secondary | ICD-10-CM | POA: Diagnosis not present

## 2019-04-18 DIAGNOSIS — M47816 Spondylosis without myelopathy or radiculopathy, lumbar region: Secondary | ICD-10-CM | POA: Diagnosis not present

## 2019-04-28 ENCOUNTER — Ambulatory Visit: Payer: Medicare HMO | Admitting: Podiatry

## 2019-04-28 ENCOUNTER — Other Ambulatory Visit: Payer: Self-pay

## 2019-04-28 VITALS — Temp 96.9°F

## 2019-04-28 DIAGNOSIS — L600 Ingrowing nail: Secondary | ICD-10-CM

## 2019-04-28 DIAGNOSIS — M79676 Pain in unspecified toe(s): Secondary | ICD-10-CM | POA: Diagnosis not present

## 2019-04-28 NOTE — Progress Notes (Signed)
  Subjective:  Patient ID: Tara Fernandez, female    DOB: [birth year],  MRN: [id number]  Chief Complaint  Patient presents with  . Follow-up    L hallux - ingrown procedure. No bleeding/pain/drainage. Pt finished Epsom salt soaks.  . Ingrown Toenail    R hallux, lateral border and R 2nd toe, medial border. Pt stated, "I don't know if they're becoming ingrown or not. Occasional tenderness".    69 y.o. female presents with the above complaint. History confirmed with patient.   Objective:  Physical Exam: warm, good capillary refill, no trophic changes or ulcerative lesions, normal DP and PT pulses and normal sensory exam. Left Foot: well healed ingrown nail procedure site  Right Foot: slight ingrown medial border hallux/ 2nd toe    Assessment:   1. Ingrown nail   2. Pain around toenail      Plan:  Patient was evaluated and treated and all questions answered.  Ingrown Nails bilat -Left healing well. Right with small ingrown nails right hallux 2nd toe gently debrided to patient relief -F/u PRN  No follow-ups on file.

## 2019-05-02 ENCOUNTER — Ambulatory Visit: Payer: Medicare HMO | Admitting: Physician Assistant

## 2019-05-02 ENCOUNTER — Ambulatory Visit: Payer: Self-pay | Admitting: Family Medicine

## 2019-05-05 ENCOUNTER — Ambulatory Visit (INDEPENDENT_AMBULATORY_CARE_PROVIDER_SITE_OTHER): Payer: Medicare HMO

## 2019-05-05 ENCOUNTER — Encounter: Payer: Self-pay | Admitting: Physician Assistant

## 2019-05-05 ENCOUNTER — Ambulatory Visit: Payer: Medicare HMO | Admitting: Physician Assistant

## 2019-05-05 ENCOUNTER — Other Ambulatory Visit: Payer: Self-pay

## 2019-05-05 DIAGNOSIS — M1711 Unilateral primary osteoarthritis, right knee: Secondary | ICD-10-CM

## 2019-05-05 DIAGNOSIS — M5442 Lumbago with sciatica, left side: Secondary | ICD-10-CM | POA: Diagnosis not present

## 2019-05-05 DIAGNOSIS — M9903 Segmental and somatic dysfunction of lumbar region: Secondary | ICD-10-CM | POA: Diagnosis not present

## 2019-05-05 DIAGNOSIS — M47816 Spondylosis without myelopathy or radiculopathy, lumbar region: Secondary | ICD-10-CM | POA: Diagnosis not present

## 2019-05-05 DIAGNOSIS — M5136 Other intervertebral disc degeneration, lumbar region: Secondary | ICD-10-CM | POA: Diagnosis not present

## 2019-05-05 MED ORDER — LIDOCAINE HCL 1 % IJ SOLN
5.0000 mL | INTRAMUSCULAR | Status: AC | PRN
  Administered 2019-05-05: 5 mL

## 2019-05-05 MED ORDER — METHYLPREDNISOLONE ACETATE 40 MG/ML IJ SUSP
40.0000 mg | INTRAMUSCULAR | Status: AC | PRN
  Administered 2019-05-05: 40 mg via INTRA_ARTICULAR

## 2019-05-05 NOTE — Progress Notes (Signed)
Office Visit Note   Patient: Tara Fernandez           Date of Birth: 20-Oct-1950           MRN: [id number] Visit Date: 05/05/2019              Requested by: Martinique, Betty G, MD [address] [place],  [place] [address] PCP: Martinique, Betty G, MD   Assessment & Plan: Visit Diagnoses:  1. Primary osteoarthritis of right knee     Plan: We will see her back in 4 weeks to see how she Tara doing in regards to the right knee.  Did discuss with her that most likely sometime in the future she will require knee replacement given the bone-on-bone arthritis lateral compartment of the knee.  She would like to try conservative treatment.  Follow-Up Instructions: Return in about 4 weeks (around 06/02/2019).   Orders:  Orders Placed This Encounter  Procedures  . Large Joint Inj  . XR Knee 1-2 Views Right   No orders of the defined types were placed in this encounter.     Procedures: Large Joint Inj on 05/05/2019 3:19 PM Indications: pain Details: 22 Fernandez 1.5 in needle, anterolateral approach  Arthrogram: No  Medications: 40 mg methylPREDNISolone acetate 40 MG/ML; 5 mL lidocaine 1 % Aspirate: 35 mL blood-tinged Outcome: tolerated well, no immediate complications Procedure, treatment alternatives, risks and benefits explained, specific risks discussed. Consent was given by the patient. Immediately prior to procedure a time out was called to verify the correct patient, procedure, equipment, support staff and site/side marked as required. Patient was prepped and draped in the usual sterile fashion.       Clinical Data: No additional findings.   Subjective: Chief Complaint  Patient presents with  . Right Knee - Pain    HPI Tara Fernandez Tara well-known.Tara Fernandez comes in today with right knee pain and swelling for the past 3 weeks.  She has some catching-like sensation in the knee.  Pain Tara mostly medial aspect of the knee when driving.  Very tender to touch.  She feels there Tara  swelling in the knee.  History of requesting possible aspiration injection in the knee.  She Tara going to [place] soon would like to be able to do some activities when down there.  She Tara tried ibuprofen for the knee pain.  She said no new injury.  Review of Systems See HPI.  Objective: Vital Signs: LMP 01/07/1996 (Approximate)   Physical Exam Constitutional:      Appearance: She Tara not ill-appearing or diaphoretic.  Pulmonary:     Effort: Pulmonary effort Tara normal.  Neurological:     Mental Status: She Tara alert and oriented to person, place, and time.  Psychiatric:        Mood and Affect: Mood normal.     Ortho Exam Right knee: Valgus deformity.  No instability valgus varus stressing.  Positive effusion.  Slight tenderness along medial joint line.  Passive range of motion reveals overall good motion but it Tara significant crepitus. Specialty Comments:  No specialty comments available.  Imaging: XR Knee 1-2 Views Right  Result Date: 05/05/2019 Right knee 2 views: No acute fracture.  Knee Tara well located.  Severe bone-on-bone with scalloping of the femoral condyle and tibial plateau on the lateral joint line.  Valgus deformity of the knee.  Moderate to severe Tello femoral changes.  Mild medial compartmental changes.    PMFS History: Patient Active Problem  List   Diagnosis Date Noted  . Chronic right shoulder pain 03/02/2019  . Chondromalacia of right patellofemoral joint 05/18/2013  . Leg length discrepancy 04/15/2013  . Arthritis of right hip 02/11/2013  . Status post THR (total hip replacement) 02/11/2013  . Degenerative arthritis of hip 06/17/2012   Past Medical History:  Diagnosis Date  . Arthritis    osteoarthritis; PAIN RIGHT HIP  . PONV (postoperative nausea and vomiting)     Family History  Problem Relation Age of Onset  . Heart attack Father   . Breast cancer Sister 66  . Lung cancer Sister   . Lung cancer Mother     Past Surgical History:  Procedure  Laterality Date  . ANTERIOR AND POSTERIOR REPAIR N/A 03/05/2015   Procedure: ANTERIOR (CYSTOCELE) REPAIR, VAULT PROLAPSE AND GRAFT;  Surgeon: Tara Loser, MD;  Location: [facility];  Service: Urology;  Laterality: N/A;  . BREAST MASS EXCISION     Hx: of left breast  . CARTILAGE SURGERY  10 th grade   RIGHT KNEE  . CYSTOSCOPY N/A 03/05/2015   Procedure: CYSTOSCOPY;  Surgeon: Tara Loser, MD;  Location: [facility];  Service: Urology;  Laterality: N/A;  . LAPAROSCOPIC VAGINAL HYSTERECTOMY WITH SALPINGECTOMY Bilateral 03/05/2015   Procedure: LAPAROSCOPIC ASSISTED VAGINAL HYSTERECTOMY WITH SALPINGECTOMY;  Surgeon: Tara Salon, MD;  Location: [facility];  Service: Gynecology;  Laterality: Bilateral;  . TOTAL HIP ARTHROPLASTY Left 06/17/2012   Procedure: LEFT TOTAL HIP ARTHROPLASTY ANTERIOR APPROACH;  Surgeon: Tara Rossetti, MD;  Location: [facility];  Service: Orthopedics;  Laterality: Left;  . TOTAL HIP ARTHROPLASTY Right 02/11/2013   Procedure: RIGHT TOTAL HIP ARTHROPLASTY ANTERIOR APPROACH;  Surgeon: Tara Rossetti, MD;  Location: [facility];  Service: Orthopedics;  Laterality: Right;   Social History   Occupational History  . Not on file  Tobacco Use  . Smoking status: Never Smoker  . Smokeless tobacco: Never Used  Substance and Sexual Activity  . Alcohol use: Yes    Alcohol/week: 0.0 standard drinks    Comment: occasional WINE  . Drug use: No  . Sexual activity: Yes    Partners: Male    Birth control/protection: Post-menopausal

## 2019-05-17 DIAGNOSIS — R69 Illness, unspecified: Secondary | ICD-10-CM | POA: Diagnosis not present

## 2019-05-18 DIAGNOSIS — M5442 Lumbago with sciatica, left side: Secondary | ICD-10-CM | POA: Diagnosis not present

## 2019-05-18 DIAGNOSIS — M47816 Spondylosis without myelopathy or radiculopathy, lumbar region: Secondary | ICD-10-CM | POA: Diagnosis not present

## 2019-05-18 DIAGNOSIS — M9903 Segmental and somatic dysfunction of lumbar region: Secondary | ICD-10-CM | POA: Diagnosis not present

## 2019-05-18 DIAGNOSIS — M5136 Other intervertebral disc degeneration, lumbar region: Secondary | ICD-10-CM | POA: Diagnosis not present

## 2019-05-20 ENCOUNTER — Ambulatory Visit: Payer: Medicare HMO | Admitting: Family Medicine

## 2019-05-20 ENCOUNTER — Encounter: Payer: Self-pay | Admitting: Family Medicine

## 2019-05-20 ENCOUNTER — Other Ambulatory Visit: Payer: Self-pay

## 2019-05-20 DIAGNOSIS — M1711 Unilateral primary osteoarthritis, right knee: Secondary | ICD-10-CM

## 2019-05-20 NOTE — Progress Notes (Signed)
Noted  

## 2019-05-20 NOTE — Progress Notes (Signed)
Office Visit Note   Patient: Tara Fernandez           Date of Birth: 09/15/1950           MRN: [id number] Visit Date: 05/20/2019 Requested by: Martinique, Betty G, MD [address] [place],  [place] [address] PCP: Martinique, Betty G, MD  Subjective: Chief Complaint  Patient presents with  . Right Knee - Pain    Knee was doing better until she had a massage yesterday. While lying on her back, the therapist bent her knee back to her buttock. Could hardly walk on it afterward. Swollen since then.    HPI: She is here with flareup of right knee pain.  Aspiration and injection a few weeks ago helped, but yesterday she had a massage and while lying on her belly, the massage therapist passively flexed her knee all the way and it hurt immediately, she could hardly bear weight after that.  It has been very swollen again.  She would also like to come here for primary care.  Her next wellness exam will be due in January.  She previously saw a physician who was interested in integrative medicine, but he moved.               ROS:   All other systems were reviewed and are negative.  Objective: Vital Signs: LMP 01/07/1996 (Approximate)   Physical Exam:  General:  Alert and oriented, in no acute distress. Pulm:  Breathing unlabored. Psy:  Normal mood, congruent affect.  : 2-3+ effusion with no warmth.  Tender on the lateral and medial joint lines.  She has 1+ laxity with varus stress but still a solid endpoint.  She lacks about 5 degrees from full extension today, flexion is about 110.  Imaging: No results found.  Assessment & Plan: 1.  Right knee pain and effusion with end-stage lateral compartment DJD -We will aspirate and inject her knee 1 more time prior to her upcoming trip to [place]. -We will attempt to get approval for gel injections for future use. -We will try a lateral compartment unloading brace.     Procedures: Right knee aspiration and injection: After sterile prep with  Betadine, injected 5 cc 1% lidocaine without epinephrine, then aspirated 70 cc of dark bloody synovial fluid, then injected 40 mg methylprednisolone from superolateral approach.    PMFS History: Patient Active Problem List   Diagnosis Date Noted  . Chronic right shoulder pain 03/02/2019  . Chondromalacia of right patellofemoral joint 05/18/2013  . Leg length discrepancy 04/15/2013  . Arthritis of right hip 02/11/2013  . Status post THR (total hip replacement) 02/11/2013  . Degenerative arthritis of hip 06/17/2012   Past Medical History:  Diagnosis Date  . Arthritis    osteoarthritis; PAIN RIGHT HIP  . PONV (postoperative nausea and vomiting)     Family History  Problem Relation Age of Onset  . Heart attack Father   . Breast cancer Sister 45  . Lung cancer Sister   . Lung cancer Mother     Past Surgical History:  Procedure Laterality Date  . ANTERIOR AND POSTERIOR REPAIR N/A 03/05/2015   Procedure: ANTERIOR (CYSTOCELE) REPAIR, VAULT PROLAPSE AND GRAFT;  Surgeon: Bjorn Loser, MD;  Location: [facility];  Service: Urology;  Laterality: N/A;  . BREAST MASS EXCISION     Hx: of left breast  . CARTILAGE SURGERY  10 th grade   RIGHT KNEE  . CYSTOSCOPY N/A 03/05/2015   Procedure: CYSTOSCOPY;  Surgeon: Bjorn Loser, MD;  Location: [facility];  Service: Urology;  Laterality: N/A;  . LAPAROSCOPIC VAGINAL HYSTERECTOMY WITH SALPINGECTOMY Bilateral 03/05/2015   Procedure: LAPAROSCOPIC ASSISTED VAGINAL HYSTERECTOMY WITH SALPINGECTOMY;  Surgeon: Megan Salon, MD;  Location: [facility];  Service: Gynecology;  Laterality: Bilateral;  . TOTAL HIP ARTHROPLASTY Left 06/17/2012   Procedure: LEFT TOTAL HIP ARTHROPLASTY ANTERIOR APPROACH;  Surgeon: Mcarthur Rossetti, MD;  Location: [facility];  Service: Orthopedics;  Laterality: Left;  . TOTAL HIP ARTHROPLASTY Right 02/11/2013   Procedure: RIGHT TOTAL HIP ARTHROPLASTY ANTERIOR APPROACH;  Surgeon: Mcarthur Rossetti, MD;  Location: [facility];  Service:  Orthopedics;  Laterality: Right;   Social History   Occupational History  . Not on file  Tobacco Use  . Smoking status: Never Smoker  . Smokeless tobacco: Never Used  Substance and Sexual Activity  . Alcohol use: Yes    Alcohol/week: 0.0 standard drinks    Comment: occasional WINE  . Drug use: No  . Sexual activity: Yes    Partners: Male    Birth control/protection: Post-menopausal

## 2019-05-23 ENCOUNTER — Telehealth: Payer: Self-pay

## 2019-05-23 NOTE — Telephone Encounter (Signed)
Submitted VOB for Gel-One, right knee. 

## 2019-05-24 ENCOUNTER — Encounter: Payer: Self-pay | Admitting: Family Medicine

## 2019-05-24 DIAGNOSIS — M47816 Spondylosis without myelopathy or radiculopathy, lumbar region: Secondary | ICD-10-CM | POA: Diagnosis not present

## 2019-05-24 DIAGNOSIS — M9903 Segmental and somatic dysfunction of lumbar region: Secondary | ICD-10-CM | POA: Diagnosis not present

## 2019-05-24 DIAGNOSIS — M5442 Lumbago with sciatica, left side: Secondary | ICD-10-CM | POA: Diagnosis not present

## 2019-05-24 DIAGNOSIS — M5136 Other intervertebral disc degeneration, lumbar region: Secondary | ICD-10-CM | POA: Diagnosis not present

## 2019-05-27 ENCOUNTER — Telehealth: Payer: Self-pay | Admitting: Family Medicine

## 2019-05-27 ENCOUNTER — Telehealth: Payer: Self-pay

## 2019-05-27 NOTE — Telephone Encounter (Signed)
Pt called stating she's getting a gel injection on the 06/09/19 but is being fitted for a brace on 06/10/19 and wanted to know if the injection will affect the fitting of the brace. If so how long should she wait after the injection to reschedule the fitting?  [phone]

## 2019-05-27 NOTE — Telephone Encounter (Signed)
Patient aware that she is approved for gel injection.  Approved, Gel-One, right knee. [place] Patient will be responsible for 20% OOP. No Co-pay PA required PA Approval# [id number] Valid 05/25/2019- 08/25/2019

## 2019-05-30 NOTE — Telephone Encounter (Signed)
It shouldn't have any effect.

## 2019-05-30 NOTE — Telephone Encounter (Signed)
I called and advised the patient. 

## 2019-05-30 NOTE — Telephone Encounter (Signed)
Would this be an issue, other than possible pain at the injection site?

## 2019-06-08 ENCOUNTER — Other Ambulatory Visit: Payer: Self-pay

## 2019-06-08 ENCOUNTER — Ambulatory Visit: Payer: Medicare HMO | Admitting: Family Medicine

## 2019-06-08 ENCOUNTER — Encounter: Payer: Self-pay | Admitting: Family Medicine

## 2019-06-08 DIAGNOSIS — M1711 Unilateral primary osteoarthritis, right knee: Secondary | ICD-10-CM | POA: Diagnosis not present

## 2019-06-08 NOTE — Progress Notes (Signed)
Office Visit Note   Patient: Tara Fernandez           Date of Birth: 09/14/1950           MRN: [id number] Visit Date: 06/08/2019 Requested by: Eunice Blase, MD [address] [place],  [place] [address] PCP: Eunice Blase, MD  Subjective: Chief Complaint  Patient presents with  . Right Knee - Pain, Follow-up    Gel-One injection    HPI: She is here for gel 1 injection for right knee osteoarthritis.               ROS:   All other systems were reviewed and are negative.  Objective: Vital Signs: LMP 01/07/1996 (Approximate)   Physical Exam:  General:  Alert and oriented, in no acute distress. Pulm:  Breathing unlabored. Psy:  Normal mood, congruent affect. Skin: No erythema Right knee: 1+ effusion with no warmth.  Tender on the medial and lateral joint lines.  Imaging: No results found.  Assessment & Plan: 1.  Right knee end-stage lateral compartment DJD -Gel 1 injection today.  She will be fitted for her unloading brace tomorrow.  Follow-up as needed.     Procedures: Right knee injection: After sterile prep with Betadine, injected 5 cc 1% lidocaine without epinephrine and gel 1 from superolateral approach, a flash of clear yellow synovial fluid was obtained prior to injection confirming intra-articular placement.    PMFS History: Patient Active Problem List   Diagnosis Date Noted  . Chronic right shoulder pain 03/02/2019  . Chondromalacia of right patellofemoral joint 05/18/2013  . Leg length discrepancy 04/15/2013  . Arthritis of right hip 02/11/2013  . Status post THR (total hip replacement) 02/11/2013  . Degenerative arthritis of hip 06/17/2012   Past Medical History:  Diagnosis Date  . Arthritis    osteoarthritis; PAIN RIGHT HIP  . PONV (postoperative nausea and vomiting)     Family History  Problem Relation Age of Onset  . Heart attack Father   . Breast cancer Sister 1  . Lung cancer Sister   . Lung cancer Mother     Past Surgical History:   Procedure Laterality Date  . ANTERIOR AND POSTERIOR REPAIR N/A 03/05/2015   Procedure: ANTERIOR (CYSTOCELE) REPAIR, VAULT PROLAPSE AND GRAFT;  Surgeon: Bjorn Loser, MD;  Location: [facility];  Service: Urology;  Laterality: N/A;  . BREAST MASS EXCISION     Hx: of left breast  . CARTILAGE SURGERY  10 th grade   RIGHT KNEE  . CYSTOSCOPY N/A 03/05/2015   Procedure: CYSTOSCOPY;  Surgeon: Bjorn Loser, MD;  Location: [facility];  Service: Urology;  Laterality: N/A;  . LAPAROSCOPIC VAGINAL HYSTERECTOMY WITH SALPINGECTOMY Bilateral 03/05/2015   Procedure: LAPAROSCOPIC ASSISTED VAGINAL HYSTERECTOMY WITH SALPINGECTOMY;  Surgeon: Megan Salon, MD;  Location: [facility];  Service: Gynecology;  Laterality: Bilateral;  . TOTAL HIP ARTHROPLASTY Left 06/17/2012   Procedure: LEFT TOTAL HIP ARTHROPLASTY ANTERIOR APPROACH;  Surgeon: Mcarthur Rossetti, MD;  Location: [facility];  Service: Orthopedics;  Laterality: Left;  . TOTAL HIP ARTHROPLASTY Right 02/11/2013   Procedure: RIGHT TOTAL HIP ARTHROPLASTY ANTERIOR APPROACH;  Surgeon: Mcarthur Rossetti, MD;  Location: [facility];  Service: Orthopedics;  Laterality: Right;   Social History   Occupational History  . Not on file  Tobacco Use  . Smoking status: Never Smoker  . Smokeless tobacco: Never Used  Substance and Sexual Activity  . Alcohol use: Yes    Alcohol/week: 0.0 standard drinks    Comment:  occasional WINE  . Drug use: No  . Sexual activity: Yes    Partners: Male    Birth control/protection: Post-menopausal

## 2019-06-09 ENCOUNTER — Ambulatory Visit: Payer: Medicare HMO | Admitting: Physician Assistant

## 2019-06-09 DIAGNOSIS — M5136 Other intervertebral disc degeneration, lumbar region: Secondary | ICD-10-CM | POA: Diagnosis not present

## 2019-06-09 DIAGNOSIS — M5442 Lumbago with sciatica, left side: Secondary | ICD-10-CM | POA: Diagnosis not present

## 2019-06-09 DIAGNOSIS — M9903 Segmental and somatic dysfunction of lumbar region: Secondary | ICD-10-CM | POA: Diagnosis not present

## 2019-06-09 DIAGNOSIS — M47816 Spondylosis without myelopathy or radiculopathy, lumbar region: Secondary | ICD-10-CM | POA: Diagnosis not present

## 2019-06-17 DIAGNOSIS — M1711 Unilateral primary osteoarthritis, right knee: Secondary | ICD-10-CM | POA: Diagnosis not present

## 2019-06-30 DIAGNOSIS — M5442 Lumbago with sciatica, left side: Secondary | ICD-10-CM | POA: Diagnosis not present

## 2019-06-30 DIAGNOSIS — M47816 Spondylosis without myelopathy or radiculopathy, lumbar region: Secondary | ICD-10-CM | POA: Diagnosis not present

## 2019-06-30 DIAGNOSIS — M9903 Segmental and somatic dysfunction of lumbar region: Secondary | ICD-10-CM | POA: Diagnosis not present

## 2019-06-30 DIAGNOSIS — M5136 Other intervertebral disc degeneration, lumbar region: Secondary | ICD-10-CM | POA: Diagnosis not present

## 2019-07-06 DIAGNOSIS — M47816 Spondylosis without myelopathy or radiculopathy, lumbar region: Secondary | ICD-10-CM | POA: Diagnosis not present

## 2019-07-06 DIAGNOSIS — M9903 Segmental and somatic dysfunction of lumbar region: Secondary | ICD-10-CM | POA: Diagnosis not present

## 2019-07-06 DIAGNOSIS — M5442 Lumbago with sciatica, left side: Secondary | ICD-10-CM | POA: Diagnosis not present

## 2019-07-06 DIAGNOSIS — M5136 Other intervertebral disc degeneration, lumbar region: Secondary | ICD-10-CM | POA: Diagnosis not present

## 2019-07-18 NOTE — Progress Notes (Signed)
69 y.o. G20P2002 Married White or Caucasian female here for annual exam.  Doing well.  Having some knee pain.  Having injections.  Had cataracts and then eye lift.  Did well with procedure.  Still having some pain with intercourse on the left side.    Denies vaginal bleeding.     Aware tdap is due.  Did not get the Covid vaccination.  Patient's last menstrual period was 01/07/1996 (approximate).          Sexually active: Yes.    The current method of family planning is status post hysterectomy.    Exercising: Yes.    yoga, walking, pilates Smoker:  no  Health Maintenance: Pap:  02-23-15 neg History of abnormal Pap:  yes MMG:  03-10-2019 category b density birads 1:neg Colonoscopy:  cologuard neg 12/18 BMD:   01-25-2019, osteopenia TDaP:  Not done, declined Pneumonia vaccine(s):  Not done Shingrix:   Done a couple of years ago Hep C testing:  Not done Screening Labs: with PCP   reports that she has never smoked. She has never used smokeless tobacco. She reports previous alcohol use. She reports that she does not use drugs.  Past Medical History:  Diagnosis Date  . Arthritis    osteoarthritis; PAIN RIGHT HIP  . PONV (postoperative nausea and vomiting)     Past Surgical History:  Procedure Laterality Date  . ANTERIOR AND POSTERIOR REPAIR N/A 03/05/2015   Procedure: ANTERIOR (CYSTOCELE) REPAIR, VAULT PROLAPSE AND GRAFT;  Surgeon: Bjorn Loser, MD;  Location: [facility];  Service: Urology;  Laterality: N/A;  . BREAST MASS EXCISION     Hx: of left breast  . CARTILAGE SURGERY  10 th grade   RIGHT KNEE  . CYSTOSCOPY N/A 03/05/2015   Procedure: CYSTOSCOPY;  Surgeon: Bjorn Loser, MD;  Location: [facility];  Service: Urology;  Laterality: N/A;  . LAPAROSCOPIC VAGINAL HYSTERECTOMY WITH SALPINGECTOMY Bilateral 03/05/2015   Procedure: LAPAROSCOPIC ASSISTED VAGINAL HYSTERECTOMY WITH SALPINGECTOMY;  Surgeon: Megan Salon, MD;  Location: [facility];  Service: Gynecology;  Laterality: Bilateral;  .  TOTAL HIP ARTHROPLASTY Left 06/17/2012   Procedure: LEFT TOTAL HIP ARTHROPLASTY ANTERIOR APPROACH;  Surgeon: Mcarthur Rossetti, MD;  Location: [facility];  Service: Orthopedics;  Laterality: Left;  . TOTAL HIP ARTHROPLASTY Right 02/11/2013   Procedure: RIGHT TOTAL HIP ARTHROPLASTY ANTERIOR APPROACH;  Surgeon: Mcarthur Rossetti, MD;  Location: [facility];  Service: Orthopedics;  Laterality: Right;    Current Outpatient Medications  Medication Sig Dispense Refill  . Boswellia Serrata (BOSWELLIA PO) Take by mouth in the morning and at bedtime.    Marland Kitchen ibuprofen (ADVIL,MOTRIN) 200 MG tablet Take 200 mg by mouth every 6 (six) hours as needed.    . S-Adenosylmethionine (SAM-E PO) Take by mouth.     No current facility-administered medications for this visit.    Family History  Problem Relation Age of Onset  . Heart attack Father   . Breast cancer Sister 2  . Lung cancer Sister   . Lung cancer Mother     Review of Systems  Constitutional: Negative.   HENT: Negative.   Eyes: Negative.   Respiratory: Negative.   Cardiovascular: Negative.   Gastrointestinal: Negative.   Endocrine: Negative.   Genitourinary: Negative.   Musculoskeletal: Negative.   Skin: Negative.   Allergic/Immunologic: Negative.   Neurological: Negative.   Hematological: Negative.   Psychiatric/Behavioral: Negative.     Exam:   BP 118/76   Pulse 68   Resp 16   Ht  5' 2.25" (1.581 m)   Wt 137 lb (62.1 kg)   LMP 01/07/1996 (Approximate)   BMI 24.86 kg/m   Height: 5' 2.25" (158.1 cm)  General appearance: alert, cooperative and appears stated age Head: Normocephalic, without obvious abnormality, atraumatic Neck: no adenopathy, supple, symmetrical, trachea midline and thyroid normal to inspection and palpation Lungs: clear to auscultation bilaterally Breasts: normal appearance, no masses or tenderness Heart: regular rate and rhythm Abdomen: soft, non-tender; bowel sounds normal; no masses,  no  organomegaly Extremities: extremities normal, atraumatic, no cyanosis or edema Skin: Skin color, texture, turgor normal. No rashes or lesions Lymph nodes: Cervical, supraclavicular, and axillary nodes normal. No abnormal inguinal nodes palpated Neurologic: Grossly normal   Pelvic: External genitalia:  no lesions              Urethra:  normal appearing urethra with no masses, tenderness or lesions              Bartholins and Skenes: normal                 Vagina: normal appearing vagina with normal color and discharge, no lesions              Cervix: absent              Pap taken: No. Bimanual Exam:  Uterus:  uterus absent              Adnexa: no mass, fullness, tenderness               Rectovaginal: Confirms               Anus:  normal sphincter tone, no lesions  Chaperone, Royal Hawthorn, CMA, was present for exam.  A:  Well Woman with normal exam PMP, no HRT H/o LAVH/bilateral salpingectomy with cystocele repari with vault supsension with cystoscopy with Dr. Matilde Sprang Hypothyroidism  P:   Mammogram guidelines reviewed.   pap smear no indicated Lab work done with PCP Vaccines reviewed.   BMD reviewed.  Plan to repeat in 2-3 years. Pt aware Tdap due.  Declines. Return annually or prn

## 2019-07-19 ENCOUNTER — Ambulatory Visit (INDEPENDENT_AMBULATORY_CARE_PROVIDER_SITE_OTHER): Payer: Medicare HMO | Admitting: Obstetrics & Gynecology

## 2019-07-19 ENCOUNTER — Encounter: Payer: Self-pay | Admitting: Obstetrics & Gynecology

## 2019-07-19 ENCOUNTER — Other Ambulatory Visit: Payer: Self-pay

## 2019-07-19 VITALS — BP 118/76 | HR 68 | Resp 16 | Ht 62.25 in | Wt 137.0 lb

## 2019-07-19 DIAGNOSIS — Z01419 Encounter for gynecological examination (general) (routine) without abnormal findings: Secondary | ICD-10-CM

## 2019-07-27 DIAGNOSIS — M5136 Other intervertebral disc degeneration, lumbar region: Secondary | ICD-10-CM | POA: Diagnosis not present

## 2019-07-27 DIAGNOSIS — M9903 Segmental and somatic dysfunction of lumbar region: Secondary | ICD-10-CM | POA: Diagnosis not present

## 2019-07-27 DIAGNOSIS — M5442 Lumbago with sciatica, left side: Secondary | ICD-10-CM | POA: Diagnosis not present

## 2019-07-27 DIAGNOSIS — M47816 Spondylosis without myelopathy or radiculopathy, lumbar region: Secondary | ICD-10-CM | POA: Diagnosis not present

## 2019-08-12 ENCOUNTER — Telehealth: Payer: Self-pay | Admitting: Family Medicine

## 2019-08-12 NOTE — Telephone Encounter (Signed)
Pt called stating she would like CB in regards to a letter  [phone]

## 2019-08-12 NOTE — Telephone Encounter (Signed)
Holding for Tara Fernandez 

## 2019-08-14 ENCOUNTER — Encounter: Payer: Self-pay | Admitting: Family Medicine

## 2019-08-15 ENCOUNTER — Encounter: Payer: Self-pay | Admitting: Family Medicine

## 2019-08-15 NOTE — Telephone Encounter (Signed)
This is a duplicate message - patient also sent a message through MyChart to Dr. Junius Roads about this.

## 2019-08-17 DIAGNOSIS — M5442 Lumbago with sciatica, left side: Secondary | ICD-10-CM | POA: Diagnosis not present

## 2019-08-17 DIAGNOSIS — M9903 Segmental and somatic dysfunction of lumbar region: Secondary | ICD-10-CM | POA: Diagnosis not present

## 2019-08-17 DIAGNOSIS — M5136 Other intervertebral disc degeneration, lumbar region: Secondary | ICD-10-CM | POA: Diagnosis not present

## 2019-08-17 DIAGNOSIS — M47816 Spondylosis without myelopathy or radiculopathy, lumbar region: Secondary | ICD-10-CM | POA: Diagnosis not present

## 2019-08-24 DIAGNOSIS — H26491 Other secondary cataract, right eye: Secondary | ICD-10-CM | POA: Diagnosis not present

## 2019-09-07 DIAGNOSIS — M5136 Other intervertebral disc degeneration, lumbar region: Secondary | ICD-10-CM | POA: Diagnosis not present

## 2019-09-07 DIAGNOSIS — M47816 Spondylosis without myelopathy or radiculopathy, lumbar region: Secondary | ICD-10-CM | POA: Diagnosis not present

## 2019-09-07 DIAGNOSIS — M5442 Lumbago with sciatica, left side: Secondary | ICD-10-CM | POA: Diagnosis not present

## 2019-09-07 DIAGNOSIS — M9903 Segmental and somatic dysfunction of lumbar region: Secondary | ICD-10-CM | POA: Diagnosis not present

## 2019-10-06 DIAGNOSIS — M9903 Segmental and somatic dysfunction of lumbar region: Secondary | ICD-10-CM | POA: Diagnosis not present

## 2019-10-06 DIAGNOSIS — M5136 Other intervertebral disc degeneration, lumbar region: Secondary | ICD-10-CM | POA: Diagnosis not present

## 2019-10-06 DIAGNOSIS — M5442 Lumbago with sciatica, left side: Secondary | ICD-10-CM | POA: Diagnosis not present

## 2019-10-06 DIAGNOSIS — M47816 Spondylosis without myelopathy or radiculopathy, lumbar region: Secondary | ICD-10-CM | POA: Diagnosis not present

## 2019-10-13 DIAGNOSIS — R69 Illness, unspecified: Secondary | ICD-10-CM | POA: Diagnosis not present

## 2019-10-18 DIAGNOSIS — M50323 Other cervical disc degeneration at C6-C7 level: Secondary | ICD-10-CM | POA: Diagnosis not present

## 2019-10-18 DIAGNOSIS — M9901 Segmental and somatic dysfunction of cervical region: Secondary | ICD-10-CM | POA: Diagnosis not present

## 2019-10-18 DIAGNOSIS — M50322 Other cervical disc degeneration at C5-C6 level: Secondary | ICD-10-CM | POA: Diagnosis not present

## 2019-10-20 DIAGNOSIS — M50323 Other cervical disc degeneration at C6-C7 level: Secondary | ICD-10-CM | POA: Diagnosis not present

## 2019-10-20 DIAGNOSIS — M50322 Other cervical disc degeneration at C5-C6 level: Secondary | ICD-10-CM | POA: Diagnosis not present

## 2019-10-20 DIAGNOSIS — M9901 Segmental and somatic dysfunction of cervical region: Secondary | ICD-10-CM | POA: Diagnosis not present

## 2019-10-22 ENCOUNTER — Telehealth (HOSPITAL_COMMUNITY): Payer: Self-pay

## 2019-10-22 ENCOUNTER — Other Ambulatory Visit: Payer: Self-pay | Admitting: Nurse Practitioner

## 2019-10-22 DIAGNOSIS — U071 COVID-19: Secondary | ICD-10-CM

## 2019-10-22 NOTE — Telephone Encounter (Signed)
Called pt and explained possible monoclonal antibody treatment. Sx started on Friday 10/14/19. Tested positive on 10/17/19 used home test. Sx include diarrhea, chills, body aches, fever, cough. Qualifying risk factors 69yo. Pt interested in tx. Informed pt an APP will call back to possibly schedule an appointment.

## 2019-10-22 NOTE — Progress Notes (Signed)
I connected by phone with Tara Fernandez on 10/22/2019 at 6:27 PM to discuss the potential use of a new treatment for mild to moderate COVID-19 viral infection in non-hospitalized patients.  This patient is a 69 y.o. female that meets the FDA criteria for Emergency Use Authorization of COVID monoclonal antibody casirivimab/imdevimab or bamlanivimab/eteseviamb.  Has a (+) direct SARS-CoV-2 viral test result  Has mild or moderate COVID-19   Is NOT hospitalized due to COVID-19  Is within 10 days of symptom onset  Has at least one of the high risk factor(s) for progression to severe COVID-19 and/or hospitalization as defined in EUA.  Specific high risk criteria : Older age (>/= 68 yo)   Symptom Day 9 10/22/2019   I have spoken and communicated the following to the patient or parent/caregiver regarding COVID monoclonal antibody treatment:  1. FDA has authorized the emergency use for the treatment of mild to moderate COVID-19 in adults and pediatric patients with positive results of direct SARS-CoV-2 viral testing who are 60 years of age and older weighing at least 40 kg, and who are at high risk for progressing to severe COVID-19 and/or hospitalization.  2. The significant known and potential risks and benefits of COVID monoclonal antibody, and the extent to which such potential risks and benefits are unknown.  3. Information on available alternative treatments and the risks and benefits of those alternatives, including clinical trials.  4. Patients treated with COVID monoclonal antibody should continue to self-isolate and use infection control measures (e.g., wear mask, isolate, social distance, avoid sharing personal items, clean and disinfect "high touch" surfaces, and frequent handwashing) according to CDC guidelines.   5. The patient or parent/caregiver has the option to accept or refuse COVID monoclonal antibody treatment.  After reviewing this information with the patient, the  patient has agreed to receive one of the available covid 19 monoclonal antibodies and will be provided an appropriate fact sheet prior to infusion. Orma Render, NP 10/22/2019 6:27 PM

## 2019-10-23 ENCOUNTER — Ambulatory Visit (HOSPITAL_COMMUNITY)
Admission: RE | Admit: 2019-10-23 | Discharge: 2019-10-23 | Disposition: A | Discharge status: Home / Self Care | Payer: Medicare Other | Source: Ambulatory Visit | Attending: Pulmonary Disease | Admitting: Pulmonary Disease

## 2019-10-23 DIAGNOSIS — Z23 Encounter for immunization: Secondary | ICD-10-CM | POA: Insufficient documentation

## 2019-10-23 DIAGNOSIS — U071 COVID-19: Secondary | ICD-10-CM | POA: Diagnosis present

## 2019-10-23 MED ORDER — DIPHENHYDRAMINE HCL 50 MG/ML IJ SOLN: 50.0000 mg | Freq: Once | INTRAMUSCULAR | Status: DC | PRN

## 2019-10-23 MED ORDER — FAMOTIDINE IN NACL 20-0.9 MG/50ML-% IV SOLN: 20.0000 mg | Freq: Once | INTRAVENOUS | Status: DC | PRN

## 2019-10-23 MED ORDER — SODIUM CHLORIDE 0.9 % IV SOLN: Freq: Once | INTRAVENOUS | Status: AC

## 2019-10-23 MED ORDER — SODIUM CHLORIDE 0.9 % IV SOLN: INTRAVENOUS | Status: DC | PRN

## 2019-10-23 MED ORDER — ALBUTEROL SULFATE HFA 108 (90 BASE) MCG/ACT IN AERS: 2.0000 | INHALATION_SPRAY | Freq: Once | RESPIRATORY_TRACT | Status: DC | PRN

## 2019-10-23 MED ORDER — METHYLPREDNISOLONE SODIUM SUCC 125 MG IJ SOLR: 125.0000 mg | Freq: Once | INTRAMUSCULAR | Status: DC | PRN

## 2019-10-23 MED ORDER — EPINEPHRINE 0.3 MG/0.3ML IJ SOAJ: 0.3000 mg | Freq: Once | INTRAMUSCULAR | Status: DC | PRN

## 2019-10-23 NOTE — Progress Notes (Signed)
  Diagnosis: COVID-19  Physician: Dr. Joya Gaskins   Procedure: [facility] Infusion [facility] Med: bamlanivimab\etesevimab infusion - Provided patient with bamlanimivab\etesevimab fact sheet for patients, parents and caregivers prior to infusion.  Complications: No immediate complications noted.  Discharge: Discharged home   Tara Fernandez 10/23/2019

## 2019-10-23 NOTE — Discharge Instructions (Signed)

## 2019-11-02 DIAGNOSIS — M5442 Lumbago with sciatica, left side: Secondary | ICD-10-CM | POA: Diagnosis not present

## 2019-11-02 DIAGNOSIS — M5136 Other intervertebral disc degeneration, lumbar region: Secondary | ICD-10-CM | POA: Diagnosis not present

## 2019-11-02 DIAGNOSIS — M47816 Spondylosis without myelopathy or radiculopathy, lumbar region: Secondary | ICD-10-CM | POA: Diagnosis not present

## 2019-11-02 DIAGNOSIS — M9903 Segmental and somatic dysfunction of lumbar region: Secondary | ICD-10-CM | POA: Diagnosis not present

## 2019-11-10 DIAGNOSIS — M5136 Other intervertebral disc degeneration, lumbar region: Secondary | ICD-10-CM | POA: Diagnosis not present

## 2019-11-10 DIAGNOSIS — M47816 Spondylosis without myelopathy or radiculopathy, lumbar region: Secondary | ICD-10-CM | POA: Diagnosis not present

## 2019-11-10 DIAGNOSIS — M9903 Segmental and somatic dysfunction of lumbar region: Secondary | ICD-10-CM | POA: Diagnosis not present

## 2019-11-10 DIAGNOSIS — M5442 Lumbago with sciatica, left side: Secondary | ICD-10-CM | POA: Diagnosis not present

## 2019-11-14 ENCOUNTER — Ambulatory Visit: Payer: Medicare Other | Admitting: Family Medicine

## 2019-11-16 ENCOUNTER — Ambulatory Visit: Payer: Self-pay

## 2019-11-16 ENCOUNTER — Telehealth: Payer: Self-pay | Admitting: Family Medicine

## 2019-11-16 ENCOUNTER — Encounter: Payer: Self-pay | Admitting: Family Medicine

## 2019-11-16 ENCOUNTER — Other Ambulatory Visit: Payer: Self-pay

## 2019-11-16 ENCOUNTER — Ambulatory Visit: Payer: Medicare HMO | Admitting: Family Medicine

## 2019-11-16 DIAGNOSIS — M25511 Pain in right shoulder: Secondary | ICD-10-CM

## 2019-11-16 DIAGNOSIS — M25561 Pain in right knee: Secondary | ICD-10-CM

## 2019-11-16 DIAGNOSIS — R059 Cough, unspecified: Secondary | ICD-10-CM

## 2019-11-16 MED ORDER — AZITHROMYCIN 250 MG PO TABS: ORAL_TABLET | ORAL | 0 refills | Status: DC

## 2019-11-16 NOTE — Progress Notes (Signed)
Office Visit Note   Patient: Tara Fernandez           Date of Birth: [birth year]           MRN: [id number] Visit Date: 11/16/2019 Requested by: Tara Blase, MD [address] [place],  [place] [address] PCP: Tara Blase, MD  Subjective: Chief Complaint  Patient presents with  . Right Knee - Pain    Knee is swollen since last week. NKI. Knee had been fine x 2 months. Requesting aspiration. Interested in having another gel injection.  . Right Shoulder - Pain    Requesting a cortisone injection - pain anterior shoulder, into upper arm.  . Cough    post Covid infection    HPI: She is here with three concerns.  About a month ago she was sick with COVID-19.  She recovered for the most part, but still has a cough which is occasionally productive.  No fevers, no shortness of breath or chest pain.  Her right shoulder has been bothering her quite a bit.  Pain mostly on the anterior aspect, with very limited range of motion.  She has a history of glenohumeral DJD.  Right knee did well after gel injection June 1, but she did some yard work recently and the next day it was swollen.  She would like to have gel injections again but for today, would like to have her knee aspirated.               ROS:   All other systems were reviewed and are negative.  Objective: Vital Signs: LMP 01/07/1996 (Approximate)   Physical Exam:  General:  Alert and oriented, in no acute distress. Pulm:  Breathing unlabored. Psy:  Normal mood, congruent affect.  CV: Regular rate and rhythm without murmurs, rubs, or gallops.  No peripheral edema.  2+ radial and posterior tibial pulses. Lungs: Clear to auscultation throughout with no wheezing or areas of consolidation. Right shoulder: Limited active range of motion with palpable crepitus during active and passive range of motion.  No significant tenderness over the biceps tendon, but she is tender generally in the anterior shoulder. Right knee: 2+ effusion with  no warmth.   Imaging: US Guided Needle Placement - No Linked Charges  Result Date: 11/16/2019 Ultrasound guided injection is preferred based studies that show increased duration, increased effect, greater accuracy, decreased procedural pain, increased response rate, and decreased cost with ultrasound guided versus blind injection.   Verbal informed consent obtained.  Time-out conducted.  Noted no overlying erythema, induration, or other signs of local infection. Ultrasound-guided right glenohumeral injection: After sterile prep with Betadine, injected 8 cc 1% lidocaine without epinephrine and 40 mg methylprednisolone using a 22-gauge spinal needle, passing the needle from posterior approach into the glenohumeral joint.  Injectate seen filling joint capsule.     Assessment & Plan: 1.  Persistent cough status post COVID-19 infection, could be bronchitis. -We will treat with Zithromax.  2.  Right shoulder glenohumeral DJD -Glenohumeral injection given today.  3.  Right knee effusion with DJD -We will aspirate the knee today.  Request approval for gel injections in December.     Procedures: Right knee aspiration: After sterile prep with Betadine, injected 5 cc 1% lidocaine without epinephrine, then aspirated 35 cc of dark bloody synovial fluid from superolateral approach.    PMFS History: Patient Active Problem List   Diagnosis Date Noted  . Chronic right shoulder pain 03/02/2019  . Chondromalacia of right patellofemoral joint 05/18/2013  .  Leg length discrepancy 04/15/2013  . Arthritis of right hip 02/11/2013  . Status post THR (total hip replacement) 02/11/2013  . Degenerative arthritis of hip 06/17/2012   Past Medical History:  Diagnosis Date  . Arthritis    osteoarthritis; PAIN RIGHT HIP  . PONV (postoperative nausea and vomiting)     Family History  Problem Relation Age of Onset  . Heart attack Father   . Breast cancer Sister 2  . Lung cancer Sister   . Lung cancer  Mother     Past Surgical History:  Procedure Laterality Date  . ANTERIOR AND POSTERIOR REPAIR N/A 03/05/2015   Procedure: ANTERIOR (CYSTOCELE) REPAIR, VAULT PROLAPSE AND GRAFT;  Surgeon: Tara Loser, MD;  Location: [facility];  Service: Urology;  Laterality: N/A;  . BREAST MASS EXCISION     Hx: of left breast  . CARTILAGE SURGERY  10 th grade   RIGHT KNEE  . CYSTOSCOPY N/A 03/05/2015   Procedure: CYSTOSCOPY;  Surgeon: Tara Loser, MD;  Location: [facility];  Service: Urology;  Laterality: N/A;  . LAPAROSCOPIC VAGINAL HYSTERECTOMY WITH SALPINGECTOMY Bilateral 03/05/2015   Procedure: LAPAROSCOPIC ASSISTED VAGINAL HYSTERECTOMY WITH SALPINGECTOMY;  Surgeon: Tara Salon, MD;  Location: [facility];  Service: Gynecology;  Laterality: Bilateral;  . TOTAL HIP ARTHROPLASTY Left 06/17/2012   Procedure: LEFT TOTAL HIP ARTHROPLASTY ANTERIOR APPROACH;  Surgeon: Tara Rossetti, MD;  Location: [facility];  Service: Orthopedics;  Laterality: Left;  . TOTAL HIP ARTHROPLASTY Right 02/11/2013   Procedure: RIGHT TOTAL HIP ARTHROPLASTY ANTERIOR APPROACH;  Surgeon: Tara Rossetti, MD;  Location: [facility];  Service: Orthopedics;  Laterality: Right;   Social History   Occupational History  . Not on file  Tobacco Use  . Smoking status: Never Smoker  . Smokeless tobacco: Never Used  Substance and Sexual Activity  . Alcohol use: Not Currently  . Drug use: No  . Sexual activity: Yes    Partners: Male    Birth control/protection: Post-menopausal, Surgical    Comment: hysterectomy

## 2019-11-16 NOTE — Telephone Encounter (Signed)
Requesting approval for gel injections for right knee OA.

## 2019-11-17 ENCOUNTER — Encounter: Payer: Self-pay | Admitting: Family Medicine

## 2019-11-17 ENCOUNTER — Other Ambulatory Visit: Payer: Self-pay

## 2019-11-17 MED ORDER — SULFAMETHOXAZOLE-TRIMETHOPRIM 800-160 MG PO TABS: 1.0000 | ORAL_TABLET | Freq: Two times a day (BID) | ORAL | 0 refills | Status: DC

## 2019-11-18 ENCOUNTER — Encounter: Payer: Self-pay | Admitting: Family Medicine

## 2019-11-18 DIAGNOSIS — M9903 Segmental and somatic dysfunction of lumbar region: Secondary | ICD-10-CM | POA: Diagnosis not present

## 2019-11-18 DIAGNOSIS — M5136 Other intervertebral disc degeneration, lumbar region: Secondary | ICD-10-CM | POA: Diagnosis not present

## 2019-11-18 DIAGNOSIS — M47816 Spondylosis without myelopathy or radiculopathy, lumbar region: Secondary | ICD-10-CM | POA: Diagnosis not present

## 2019-11-18 DIAGNOSIS — M5442 Lumbago with sciatica, left side: Secondary | ICD-10-CM | POA: Diagnosis not present

## 2019-11-18 NOTE — Telephone Encounter (Signed)
Submitted for VOB for Gel one-Right knee Last injection was on 06/08/19... ok to for repeat after 12/08/19

## 2019-11-21 ENCOUNTER — Encounter: Payer: Self-pay | Admitting: Family Medicine

## 2019-11-21 ENCOUNTER — Telehealth: Payer: Self-pay

## 2019-11-21 NOTE — Telephone Encounter (Signed)
Patients insurance requires Prior auth. Paperwork was completed and will be faxed tomorrow

## 2019-11-22 NOTE — Telephone Encounter (Signed)
Prior auth faxed

## 2019-11-23 ENCOUNTER — Telehealth: Payer: Self-pay

## 2019-11-23 NOTE — Telephone Encounter (Signed)
Called and pt is scheduled for 12/09/19

## 2019-11-23 NOTE — Telephone Encounter (Signed)
Approved for Gel one-Right knee Dr. Junius Roads Buy and Bill $25 copay 20% OOP Prior auth required with [employer] Humbles # [id number] Dates: 12/09/19-03/08/20   Ok to schedule as of 12/09/19

## 2019-11-24 ENCOUNTER — Encounter: Payer: Self-pay | Admitting: Family Medicine

## 2019-11-28 DIAGNOSIS — M9903 Segmental and somatic dysfunction of lumbar region: Secondary | ICD-10-CM | POA: Diagnosis not present

## 2019-11-28 DIAGNOSIS — M47816 Spondylosis without myelopathy or radiculopathy, lumbar region: Secondary | ICD-10-CM | POA: Diagnosis not present

## 2019-11-28 DIAGNOSIS — M5136 Other intervertebral disc degeneration, lumbar region: Secondary | ICD-10-CM | POA: Diagnosis not present

## 2019-11-28 DIAGNOSIS — M5442 Lumbago with sciatica, left side: Secondary | ICD-10-CM | POA: Diagnosis not present

## 2019-12-06 ENCOUNTER — Other Ambulatory Visit: Payer: Self-pay

## 2019-12-06 ENCOUNTER — Ambulatory Visit (INDEPENDENT_AMBULATORY_CARE_PROVIDER_SITE_OTHER): Payer: Medicare HMO

## 2019-12-06 ENCOUNTER — Ambulatory Visit: Payer: Medicare HMO | Admitting: Podiatry

## 2019-12-06 DIAGNOSIS — M2012 Hallux valgus (acquired), left foot: Secondary | ICD-10-CM

## 2019-12-06 DIAGNOSIS — M2042 Other hammer toe(s) (acquired), left foot: Secondary | ICD-10-CM

## 2019-12-06 DIAGNOSIS — S99922A Unspecified injury of left foot, initial encounter: Secondary | ICD-10-CM

## 2019-12-06 DIAGNOSIS — M21612 Bunion of left foot: Secondary | ICD-10-CM | POA: Diagnosis not present

## 2019-12-06 DIAGNOSIS — M7752 Other enthesopathy of left foot: Secondary | ICD-10-CM | POA: Diagnosis not present

## 2019-12-06 DIAGNOSIS — M79672 Pain in left foot: Secondary | ICD-10-CM

## 2019-12-06 MED ORDER — BETAMETHASONE SOD PHOS & ACET 6 (3-3) MG/ML IJ SUSP
3.0000 mg | Freq: Once | INTRAMUSCULAR | Status: AC
  Administered 2019-12-06: 3 mg

## 2019-12-06 NOTE — Progress Notes (Signed)
  Subjective:  Patient ID: Tara Fernandez, female    DOB: [birth year],  MRN: [id number]  Chief Complaint  Patient presents with  . Toe Pain    Left sub 2nd painful area, "Comes and goes, recently came back 1 month ago". Denies any known injuries.    69 y.o. female presents with the above complaint. History confirmed with patient.   Objective:  Physical Exam: warm, good capillary refill, no trophic changes or ulcerative lesions, normal DP and PT pulses and normal sensory exam. Left Foot: Pain to palpation about the second MPJ with positive modified Lachman test.  Gross hypermobility noted the first tarsometatarsal joint with severe HAV deformity.  Flexible.  Lesser digital contractures  No images are attached to the encounter.  Radiographs: X-ray of the left foot: hallux valgus deformity, elongated second metatarsal and digital contractures Assessment:   1. Capsulitis of metatarsophalangeal (MTP) joint of left foot   2. Injury of plantar plate of left foot, initial encounter   3. Hallux valgus with bunions of left foot   4. Hammer toe of left foot    Plan:  Patient was evaluated and treated and all questions answered.  Capsulitis -Educated on etiology -XR reviewed with patient -Injection delivered to the painful joint  Procedure: Joint Injection Location: Left 2nd MPJ joint Skin Prep: Alcohol. Injectate: 0.5 cc 1% lidocaine plain, 0.5 cc celestone phosphate. Disposition: Patient tolerated procedure well. Injection site dressed with a band-aid.   Return in about 4 weeks (around 01/03/2020) for Capsulitis.

## 2019-12-09 ENCOUNTER — Ambulatory Visit: Payer: Medicare HMO | Admitting: Family Medicine

## 2019-12-09 ENCOUNTER — Other Ambulatory Visit: Payer: Self-pay

## 2019-12-09 DIAGNOSIS — M1711 Unilateral primary osteoarthritis, right knee: Secondary | ICD-10-CM

## 2019-12-09 NOTE — Progress Notes (Signed)
Subjective: She is here for planned gel 1 injection for right knee osteoarthritis.  She has had some swelling again since last visit.  Objective: 2+ effusion with no warmth.  Procedure: Right knee aspiration and injection: After sterile prep with Betadine, injected 3 cc 1% lidocaine without epinephrine, then aspirated 35 cc of blood-tinged synovial fluid, then injected gel 1 from superolateral approach.

## 2019-12-13 ENCOUNTER — Ambulatory Visit: Payer: Medicare HMO | Admitting: Podiatry

## 2019-12-13 ENCOUNTER — Other Ambulatory Visit: Payer: Self-pay | Admitting: Podiatry

## 2019-12-13 DIAGNOSIS — M7752 Other enthesopathy of left foot: Secondary | ICD-10-CM

## 2019-12-14 DIAGNOSIS — M5136 Other intervertebral disc degeneration, lumbar region: Secondary | ICD-10-CM | POA: Diagnosis not present

## 2019-12-14 DIAGNOSIS — M9903 Segmental and somatic dysfunction of lumbar region: Secondary | ICD-10-CM | POA: Diagnosis not present

## 2019-12-14 DIAGNOSIS — M47816 Spondylosis without myelopathy or radiculopathy, lumbar region: Secondary | ICD-10-CM | POA: Diagnosis not present

## 2019-12-14 DIAGNOSIS — M5442 Lumbago with sciatica, left side: Secondary | ICD-10-CM | POA: Diagnosis not present

## 2019-12-26 DIAGNOSIS — M5442 Lumbago with sciatica, left side: Secondary | ICD-10-CM | POA: Diagnosis not present

## 2019-12-26 DIAGNOSIS — M5136 Other intervertebral disc degeneration, lumbar region: Secondary | ICD-10-CM | POA: Diagnosis not present

## 2019-12-26 DIAGNOSIS — M9903 Segmental and somatic dysfunction of lumbar region: Secondary | ICD-10-CM | POA: Diagnosis not present

## 2019-12-26 DIAGNOSIS — M47816 Spondylosis without myelopathy or radiculopathy, lumbar region: Secondary | ICD-10-CM | POA: Diagnosis not present

## 2020-01-04 ENCOUNTER — Telehealth: Payer: Self-pay | Admitting: Family Medicine

## 2020-01-04 NOTE — Telephone Encounter (Signed)
E 

## 2020-01-11 DIAGNOSIS — M5136 Other intervertebral disc degeneration, lumbar region: Secondary | ICD-10-CM | POA: Diagnosis not present

## 2020-01-11 DIAGNOSIS — M47816 Spondylosis without myelopathy or radiculopathy, lumbar region: Secondary | ICD-10-CM | POA: Diagnosis not present

## 2020-01-11 DIAGNOSIS — M9903 Segmental and somatic dysfunction of lumbar region: Secondary | ICD-10-CM | POA: Diagnosis not present

## 2020-01-11 DIAGNOSIS — M5442 Lumbago with sciatica, left side: Secondary | ICD-10-CM | POA: Diagnosis not present

## 2020-01-12 ENCOUNTER — Telehealth: Payer: Self-pay | Admitting: Family Medicine

## 2020-01-12 NOTE — Telephone Encounter (Signed)
Left message for patient to call back and schedule Medicare Annual Wellness Visit (AWV) either virtually or in office.   Last AWV no information please schedule at anytime with [facility] Nurse Health Advisor 1 or 2   This should be a 45 minute visit. 

## 2020-01-13 ENCOUNTER — Ambulatory Visit: Payer: Medicare HMO | Admitting: Family Medicine

## 2020-01-13 ENCOUNTER — Encounter: Payer: Medicare HMO | Admitting: Family Medicine

## 2020-01-13 ENCOUNTER — Ambulatory Visit: Payer: Medicare HMO | Admitting: Podiatry

## 2020-01-25 ENCOUNTER — Encounter: Payer: Self-pay | Admitting: Family Medicine

## 2020-01-25 ENCOUNTER — Ambulatory Visit (INDEPENDENT_AMBULATORY_CARE_PROVIDER_SITE_OTHER): Payer: Medicare HMO | Admitting: Family Medicine

## 2020-01-25 ENCOUNTER — Other Ambulatory Visit: Payer: Self-pay

## 2020-01-25 ENCOUNTER — Ambulatory Visit: Payer: Medicare HMO | Admitting: Family Medicine

## 2020-01-25 VITALS — BP 104/70 | HR 89 | Ht 62.7 in | Wt 140.2 lb

## 2020-01-25 DIAGNOSIS — Z1211 Encounter for screening for malignant neoplasm of colon: Secondary | ICD-10-CM

## 2020-01-25 DIAGNOSIS — E559 Vitamin D deficiency, unspecified: Secondary | ICD-10-CM | POA: Diagnosis not present

## 2020-01-25 DIAGNOSIS — Z0000 Encounter for general adult medical examination without abnormal findings: Secondary | ICD-10-CM | POA: Diagnosis not present

## 2020-01-25 DIAGNOSIS — E785 Hyperlipidemia, unspecified: Secondary | ICD-10-CM | POA: Diagnosis not present

## 2020-01-25 NOTE — Addendum Note (Signed)
Addended by: Hortencia Pilar on: 01/25/2020 02:17 PM   Modules accepted: Orders

## 2020-01-25 NOTE — Progress Notes (Signed)
Office Visit Note   Patient: Britainy Fernandez           Date of Birth: 11/04/1950           MRN: [id number] Visit Date: 01/25/2020 Requested by: [place], Betty G, [place],  [place] [address] PCP: Eunice Blase, MD  Subjective: Chief Complaint  Patient presents with  . Medicare Wellness    Would like to have the right knee rechecked Having a problem with constant mucus in the throat, post covid.    HPI: She is here for annual wellness exam.  She continues to have some postnasal drainage and occasional cough status post COVID illness a couple months ago.  She does not feel sick, but it is a nuisance.  Her right knee feels better after gel injection but it still swells periodically.  She feels as though it needed to be injected from the medial side rather than lateral.  She continues to have issues with her right hip.  She does not feel like the alignment is correct after replacement.  She did very well with the left hip, but not the right.  Family history was updated.  She is up-to-date on mammograms, Cologuard testing recently which was normal.  She is up-to-date on eye exams and dental exams.               ROS:   All other systems were reviewed and are negative.  Objective: Vital Signs: BP 104/70   Pulse 89   Ht 5' 2.7" (1.593 m)   Wt 140 lb 3.2 oz (63.6 kg)   LMP 01/07/1996 (Approximate)   BMI 25.07 kg/m   Physical Exam:  General:  Alert and oriented, in no acute distress. Pulm:  Breathing unlabored. Psy:  Normal mood, congruent affect. Skin: No suspicious lesions. HEENT:  [place]/AT, PERRLA, EOM Full, no nystagmus.  Funduscopic examination within normal limits.  No conjunctival erythema.  Tympanic membranes are pearly gray with normal landmarks.  External ear canals are normal.  Nasal passages are clear.  Oropharynx is clear.  No significant lymphadenopathy.  No thyromegaly or nodules.  2+ carotid pulses without bruits. CV: Regular rate and rhythm  without murmurs, rubs, or gallops.  No peripheral edema.  2+ radial and posterior tibial pulses. Lungs: Clear to auscultation throughout with no wheezing or areas of consolidation. Abd: Bowel sounds are active, no hepatosplenomegaly or masses.  Soft and nontender.  No audible bruits.  No evidence of ascites. Extremities: Right knee has 1+ effusion with no warmth.    Imaging: No results found.  Assessment & Plan: 1. wellness exam -Labs to evaluate.  Follow-up in a year.  2.  Postnasal drainage -Trial of quercetin and NAC.  3. right knee osteoarthritis -Aspiration and cortisone injection if symptoms worsen.  We would do this from medial approach.     Procedures: No procedures performed        PMFS History: Patient Active Problem List   Diagnosis Date Noted  . Chronic right shoulder pain 03/02/2019  . Chondromalacia of right patellofemoral joint 05/18/2013  . Leg length discrepancy 04/15/2013  . Arthritis of right hip 02/11/2013  . Status post THR (total hip replacement) 02/11/2013  . Degenerative arthritis of hip 06/17/2012   Past Medical History:  Diagnosis Date  . Arthritis    osteoarthritis; PAIN RIGHT HIP  . PONV (postoperative nausea and vomiting)     Family History  Problem Relation Age of Onset  . Heart attack Father   .  Cancer Father   . Tongue cancer Father   . Breast cancer Sister 75  . Lung cancer Sister   . Cancer Sister   . Lung cancer Mother   . Cancer Mother   . Lupus Sister   . Cancer Sister   . Breast cancer Sister   . Healthy Brother   . Cancer Maternal Aunt   . Cancer Maternal Uncle   . Cancer Paternal Aunt   . Cancer Paternal Uncle   . Healthy Brother   . Diabetes Neg Hx   . Hypertension Neg Hx   . Thyroid disease Neg Hx     Past Surgical History:  Procedure Laterality Date  . ANTERIOR AND POSTERIOR REPAIR N/A 03/05/2015   Procedure: ANTERIOR (CYSTOCELE) REPAIR, VAULT PROLAPSE AND GRAFT;  Surgeon: Bjorn Loser, MD;  Location:  [facility];  Service: Urology;  Laterality: N/A;  . BREAST MASS EXCISION     Hx: of left breast  . CARTILAGE SURGERY  10 th grade   RIGHT KNEE  . CYSTOSCOPY N/A 03/05/2015   Procedure: CYSTOSCOPY;  Surgeon: Bjorn Loser, MD;  Location: [facility];  Service: Urology;  Laterality: N/A;  . LAPAROSCOPIC VAGINAL HYSTERECTOMY WITH SALPINGECTOMY Bilateral 03/05/2015   Procedure: LAPAROSCOPIC ASSISTED VAGINAL HYSTERECTOMY WITH SALPINGECTOMY;  Surgeon: Megan Salon, MD;  Location: [facility];  Service: Gynecology;  Laterality: Bilateral;  . TOTAL HIP ARTHROPLASTY Left 06/17/2012   Procedure: LEFT TOTAL HIP ARTHROPLASTY ANTERIOR APPROACH;  Surgeon: Mcarthur Rossetti, MD;  Location: [facility];  Service: Orthopedics;  Laterality: Left;  . TOTAL HIP ARTHROPLASTY Right 02/11/2013   Procedure: RIGHT TOTAL HIP ARTHROPLASTY ANTERIOR APPROACH;  Surgeon: Mcarthur Rossetti, MD;  Location: [facility];  Service: Orthopedics;  Laterality: Right;   Social History   Occupational History  . Not on file  Tobacco Use  . Smoking status: Never Smoker  . Smokeless tobacco: Never Used  Substance and Sexual Activity  . Alcohol use: Not Currently  . Drug use: No  . Sexual activity: Yes    Partners: Male    Birth control/protection: Post-menopausal, Surgical    Comment: hysterectomy

## 2020-01-26 ENCOUNTER — Telehealth: Payer: Self-pay | Admitting: Family Medicine

## 2020-01-26 DIAGNOSIS — E785 Hyperlipidemia, unspecified: Secondary | ICD-10-CM

## 2020-01-26 LAB — URINALYSIS, ROUTINE W REFLEX MICROSCOPIC
Bilirubin Urine: NEGATIVE
Glucose, UA: NEGATIVE
Hgb urine dipstick: NEGATIVE
Ketones, ur: NEGATIVE
Leukocytes,Ua: NEGATIVE
Nitrite: NEGATIVE
Protein, ur: NEGATIVE
Specific Gravity, Urine: 1.009 (ref 1.001–1.03)
pH: 6.5 (ref 5.0–8.0)

## 2020-01-26 LAB — COMPREHENSIVE METABOLIC PANEL
AG Ratio: 2.6 (calc) — ABNORMAL HIGH (ref 1.0–2.5)
ALT: 24 U/L (ref 6–29)
AST: 21 U/L (ref 10–35)
Albumin: 4.6 g/dL (ref 3.6–5.1)
Alkaline phosphatase (APISO): 63 U/L (ref 37–153)
BUN: 13 mg/dL (ref 7–25)
CO2: 26 mmol/L (ref 20–32)
Calcium: 9.5 mg/dL (ref 8.6–10.4)
Chloride: 102 mmol/L (ref 98–110)
Creat: 0.6 mg/dL (ref 0.50–0.99)
Globulin: 1.8 g/dL (calc) — ABNORMAL LOW (ref 1.9–3.7)
Glucose, Bld: 86 mg/dL (ref 65–99)
Potassium: 4 mmol/L (ref 3.5–5.3)
Sodium: 140 mmol/L (ref 135–146)
Total Bilirubin: 0.7 mg/dL (ref 0.2–1.2)
Total Protein: 6.4 g/dL (ref 6.1–8.1)

## 2020-01-26 LAB — LIPID PANEL
Cholesterol: 244 mg/dL — ABNORMAL HIGH (ref <200)
HDL: 91 mg/dL (ref >=50)
LDL Cholesterol (Calc): 136 mg/dL (calc) — ABNORMAL HIGH
Non-HDL Cholesterol (Calc): 153 mg/dL (calc) — ABNORMAL HIGH (ref <130)
Total CHOL/HDL Ratio: 2.7 (calc) (ref <5.0)
Triglycerides: 78 mg/dL (ref <150)

## 2020-01-26 LAB — CBC WITH DIFFERENTIAL/PLATELET
Absolute Monocytes: 716 cells/uL (ref 200–950)
Basophils Absolute: 46 cells/uL (ref 0–200)
Basophils Relative: 0.6 %
Eosinophils Absolute: 139 cells/uL (ref 15–500)
Eosinophils Relative: 1.8 %
HCT: 42.1 % (ref 35.0–45.0)
Hemoglobin: 14.7 g/dL (ref 11.7–15.5)
Lymphs Abs: 2441 cells/uL (ref 850–3900)
MCH: 31.1 pg (ref 27.0–33.0)
MCHC: 34.9 g/dL (ref 32.0–36.0)
MCV: 89.2 fL (ref 80.0–100.0)
MPV: 10.4 fL (ref 7.5–12.5)
Monocytes Relative: 9.3 %
Neutro Abs: 4358 cells/uL (ref 1500–7800)
Neutrophils Relative %: 56.6 %
Platelets: 243 10*3/uL (ref 140–400)
RBC: 4.72 10*6/uL (ref 3.80–5.10)
RDW: 12.6 % (ref 11.0–15.0)
Total Lymphocyte: 31.7 %
WBC: 7.7 10*3/uL (ref 3.8–10.8)

## 2020-01-26 LAB — THYROID PANEL WITH TSH
Free Thyroxine Index: 2.4 (ref 1.4–3.8)
T3 Uptake: 31 % (ref 22–35)
T4, Total: 7.8 ug/dL (ref 5.1–11.9)
TSH: 2.25 mIU/L (ref 0.40–4.50)

## 2020-01-26 LAB — SARS-COV-2 ANTIBODY(IGG)SPIKE,SEMI-QUANTITATIVE: SARS COV1 AB(IGG)SPIKE,SEMI QN: >150 index — ABNORMAL HIGH (ref <1.00)

## 2020-01-26 LAB — HIGH SENSITIVITY CRP: hs-CRP: 0.7 mg/L

## 2020-01-26 LAB — VITAMIN D 25 HYDROXY (VIT D DEFICIENCY, FRACTURES): Vit D, 25-Hydroxy: 28 ng/mL — ABNORMAL LOW (ref 30–100)

## 2020-01-26 NOTE — Telephone Encounter (Signed)
Labs are notable for the following:  COVID-19 antibodies are strongly positive.  Total cholesterol and LDL are elevated, but HDL and triglycerides look excellent.  No changes - regular exercise and dietary limitation of processed carbohydrates and sweets is all that's generally needed.  Vitamin D is low at 28.  I recommend taking an additional vitamin D3 at 5000 IU daily.  All else looks good.

## 2020-01-27 NOTE — Addendum Note (Signed)
Addended by: Hortencia Pilar on: 01/27/2020 03:22 PM   Modules accepted: Orders

## 2020-01-30 ENCOUNTER — Telehealth: Payer: Self-pay

## 2020-01-30 NOTE — Telephone Encounter (Signed)
Patient called she would like a call back regarding a message Dr.Hilts sent her on my chart about getting a blood panel. [phone]

## 2020-01-30 NOTE — Telephone Encounter (Signed)
I called the patient. She wanted more clarification on the lipid results and the globulin. I explained more about these and what this next added test (Cardio IQ) is looking for. I did reiterate that it is not urgent that she have this next test done - she can wait 6 months if she would like.

## 2020-02-01 DIAGNOSIS — M5451 Vertebrogenic low back pain: Secondary | ICD-10-CM | POA: Diagnosis not present

## 2020-02-01 DIAGNOSIS — M25511 Pain in right shoulder: Secondary | ICD-10-CM | POA: Diagnosis not present

## 2020-02-01 DIAGNOSIS — M9904 Segmental and somatic dysfunction of sacral region: Secondary | ICD-10-CM | POA: Diagnosis not present

## 2020-02-01 DIAGNOSIS — M25552 Pain in left hip: Secondary | ICD-10-CM | POA: Diagnosis not present

## 2020-02-01 DIAGNOSIS — M9901 Segmental and somatic dysfunction of cervical region: Secondary | ICD-10-CM | POA: Diagnosis not present

## 2020-02-01 DIAGNOSIS — M9902 Segmental and somatic dysfunction of thoracic region: Secondary | ICD-10-CM | POA: Diagnosis not present

## 2020-02-01 DIAGNOSIS — M25561 Pain in right knee: Secondary | ICD-10-CM | POA: Diagnosis not present

## 2020-02-01 DIAGNOSIS — M9903 Segmental and somatic dysfunction of lumbar region: Secondary | ICD-10-CM | POA: Diagnosis not present

## 2020-02-01 DIAGNOSIS — M25551 Pain in right hip: Secondary | ICD-10-CM | POA: Diagnosis not present

## 2020-02-02 DIAGNOSIS — M9901 Segmental and somatic dysfunction of cervical region: Secondary | ICD-10-CM | POA: Diagnosis not present

## 2020-02-02 DIAGNOSIS — M9904 Segmental and somatic dysfunction of sacral region: Secondary | ICD-10-CM | POA: Diagnosis not present

## 2020-02-02 DIAGNOSIS — M25561 Pain in right knee: Secondary | ICD-10-CM | POA: Diagnosis not present

## 2020-02-02 DIAGNOSIS — M9903 Segmental and somatic dysfunction of lumbar region: Secondary | ICD-10-CM | POA: Diagnosis not present

## 2020-02-02 DIAGNOSIS — M5451 Vertebrogenic low back pain: Secondary | ICD-10-CM | POA: Diagnosis not present

## 2020-02-02 DIAGNOSIS — M25511 Pain in right shoulder: Secondary | ICD-10-CM | POA: Diagnosis not present

## 2020-02-02 DIAGNOSIS — M25551 Pain in right hip: Secondary | ICD-10-CM | POA: Diagnosis not present

## 2020-02-02 DIAGNOSIS — M9902 Segmental and somatic dysfunction of thoracic region: Secondary | ICD-10-CM | POA: Diagnosis not present

## 2020-02-02 DIAGNOSIS — M25552 Pain in left hip: Secondary | ICD-10-CM | POA: Diagnosis not present

## 2020-02-03 DIAGNOSIS — M9901 Segmental and somatic dysfunction of cervical region: Secondary | ICD-10-CM | POA: Diagnosis not present

## 2020-02-03 DIAGNOSIS — M5451 Vertebrogenic low back pain: Secondary | ICD-10-CM | POA: Diagnosis not present

## 2020-02-03 DIAGNOSIS — M25561 Pain in right knee: Secondary | ICD-10-CM | POA: Diagnosis not present

## 2020-02-03 DIAGNOSIS — M25511 Pain in right shoulder: Secondary | ICD-10-CM | POA: Diagnosis not present

## 2020-02-03 DIAGNOSIS — M9904 Segmental and somatic dysfunction of sacral region: Secondary | ICD-10-CM | POA: Diagnosis not present

## 2020-02-03 DIAGNOSIS — M9903 Segmental and somatic dysfunction of lumbar region: Secondary | ICD-10-CM | POA: Diagnosis not present

## 2020-02-03 DIAGNOSIS — M25551 Pain in right hip: Secondary | ICD-10-CM | POA: Diagnosis not present

## 2020-02-03 DIAGNOSIS — M9902 Segmental and somatic dysfunction of thoracic region: Secondary | ICD-10-CM | POA: Diagnosis not present

## 2020-02-03 DIAGNOSIS — M25552 Pain in left hip: Secondary | ICD-10-CM | POA: Diagnosis not present

## 2020-02-06 DIAGNOSIS — M9904 Segmental and somatic dysfunction of sacral region: Secondary | ICD-10-CM | POA: Diagnosis not present

## 2020-02-06 DIAGNOSIS — M9901 Segmental and somatic dysfunction of cervical region: Secondary | ICD-10-CM | POA: Diagnosis not present

## 2020-02-06 DIAGNOSIS — M9903 Segmental and somatic dysfunction of lumbar region: Secondary | ICD-10-CM | POA: Diagnosis not present

## 2020-02-06 DIAGNOSIS — M9905 Segmental and somatic dysfunction of pelvic region: Secondary | ICD-10-CM | POA: Diagnosis not present

## 2020-02-08 DIAGNOSIS — M9903 Segmental and somatic dysfunction of lumbar region: Secondary | ICD-10-CM | POA: Diagnosis not present

## 2020-02-08 DIAGNOSIS — M9902 Segmental and somatic dysfunction of thoracic region: Secondary | ICD-10-CM | POA: Diagnosis not present

## 2020-02-08 DIAGNOSIS — M25561 Pain in right knee: Secondary | ICD-10-CM | POA: Diagnosis not present

## 2020-02-08 DIAGNOSIS — M25551 Pain in right hip: Secondary | ICD-10-CM | POA: Diagnosis not present

## 2020-02-08 DIAGNOSIS — M9901 Segmental and somatic dysfunction of cervical region: Secondary | ICD-10-CM | POA: Diagnosis not present

## 2020-02-09 ENCOUNTER — Ambulatory Visit: Payer: Medicare HMO | Admitting: Family Medicine

## 2020-02-09 DIAGNOSIS — M25561 Pain in right knee: Secondary | ICD-10-CM | POA: Diagnosis not present

## 2020-02-09 DIAGNOSIS — M47816 Spondylosis without myelopathy or radiculopathy, lumbar region: Secondary | ICD-10-CM | POA: Diagnosis not present

## 2020-02-09 DIAGNOSIS — M5442 Lumbago with sciatica, left side: Secondary | ICD-10-CM | POA: Diagnosis not present

## 2020-02-09 DIAGNOSIS — M5136 Other intervertebral disc degeneration, lumbar region: Secondary | ICD-10-CM | POA: Diagnosis not present

## 2020-02-09 DIAGNOSIS — M9901 Segmental and somatic dysfunction of cervical region: Secondary | ICD-10-CM | POA: Diagnosis not present

## 2020-02-09 DIAGNOSIS — M25551 Pain in right hip: Secondary | ICD-10-CM | POA: Diagnosis not present

## 2020-02-09 DIAGNOSIS — M9903 Segmental and somatic dysfunction of lumbar region: Secondary | ICD-10-CM | POA: Diagnosis not present

## 2020-02-09 DIAGNOSIS — M9902 Segmental and somatic dysfunction of thoracic region: Secondary | ICD-10-CM | POA: Diagnosis not present

## 2020-02-13 DIAGNOSIS — M25551 Pain in right hip: Secondary | ICD-10-CM | POA: Diagnosis not present

## 2020-02-13 DIAGNOSIS — M9901 Segmental and somatic dysfunction of cervical region: Secondary | ICD-10-CM | POA: Diagnosis not present

## 2020-02-13 DIAGNOSIS — M9903 Segmental and somatic dysfunction of lumbar region: Secondary | ICD-10-CM | POA: Diagnosis not present

## 2020-02-13 DIAGNOSIS — M9902 Segmental and somatic dysfunction of thoracic region: Secondary | ICD-10-CM | POA: Diagnosis not present

## 2020-02-13 DIAGNOSIS — M25561 Pain in right knee: Secondary | ICD-10-CM | POA: Diagnosis not present

## 2020-02-16 DIAGNOSIS — M25551 Pain in right hip: Secondary | ICD-10-CM | POA: Diagnosis not present

## 2020-02-16 DIAGNOSIS — M47816 Spondylosis without myelopathy or radiculopathy, lumbar region: Secondary | ICD-10-CM | POA: Diagnosis not present

## 2020-02-16 DIAGNOSIS — M9903 Segmental and somatic dysfunction of lumbar region: Secondary | ICD-10-CM | POA: Diagnosis not present

## 2020-02-16 DIAGNOSIS — M9901 Segmental and somatic dysfunction of cervical region: Secondary | ICD-10-CM | POA: Diagnosis not present

## 2020-02-16 DIAGNOSIS — M5136 Other intervertebral disc degeneration, lumbar region: Secondary | ICD-10-CM | POA: Diagnosis not present

## 2020-02-16 DIAGNOSIS — M5442 Lumbago with sciatica, left side: Secondary | ICD-10-CM | POA: Diagnosis not present

## 2020-02-16 DIAGNOSIS — M25561 Pain in right knee: Secondary | ICD-10-CM | POA: Diagnosis not present

## 2020-02-16 DIAGNOSIS — M9902 Segmental and somatic dysfunction of thoracic region: Secondary | ICD-10-CM | POA: Diagnosis not present

## 2020-02-20 DIAGNOSIS — M9901 Segmental and somatic dysfunction of cervical region: Secondary | ICD-10-CM | POA: Diagnosis not present

## 2020-02-20 DIAGNOSIS — M9902 Segmental and somatic dysfunction of thoracic region: Secondary | ICD-10-CM | POA: Diagnosis not present

## 2020-02-20 DIAGNOSIS — M25561 Pain in right knee: Secondary | ICD-10-CM | POA: Diagnosis not present

## 2020-02-20 DIAGNOSIS — M9903 Segmental and somatic dysfunction of lumbar region: Secondary | ICD-10-CM | POA: Diagnosis not present

## 2020-02-20 DIAGNOSIS — M25551 Pain in right hip: Secondary | ICD-10-CM | POA: Diagnosis not present

## 2020-02-22 DIAGNOSIS — M9902 Segmental and somatic dysfunction of thoracic region: Secondary | ICD-10-CM | POA: Diagnosis not present

## 2020-02-22 DIAGNOSIS — M25551 Pain in right hip: Secondary | ICD-10-CM | POA: Diagnosis not present

## 2020-02-22 DIAGNOSIS — M9903 Segmental and somatic dysfunction of lumbar region: Secondary | ICD-10-CM | POA: Diagnosis not present

## 2020-02-22 DIAGNOSIS — M62838 Other muscle spasm: Secondary | ICD-10-CM | POA: Diagnosis not present

## 2020-02-22 DIAGNOSIS — M9901 Segmental and somatic dysfunction of cervical region: Secondary | ICD-10-CM | POA: Diagnosis not present

## 2020-02-22 DIAGNOSIS — M25561 Pain in right knee: Secondary | ICD-10-CM | POA: Diagnosis not present

## 2020-02-23 DIAGNOSIS — M25561 Pain in right knee: Secondary | ICD-10-CM | POA: Diagnosis not present

## 2020-02-23 DIAGNOSIS — M62838 Other muscle spasm: Secondary | ICD-10-CM | POA: Diagnosis not present

## 2020-02-23 DIAGNOSIS — M25551 Pain in right hip: Secondary | ICD-10-CM | POA: Diagnosis not present

## 2020-02-23 DIAGNOSIS — M9903 Segmental and somatic dysfunction of lumbar region: Secondary | ICD-10-CM | POA: Diagnosis not present

## 2020-02-23 DIAGNOSIS — M9902 Segmental and somatic dysfunction of thoracic region: Secondary | ICD-10-CM | POA: Diagnosis not present

## 2020-02-23 DIAGNOSIS — M9901 Segmental and somatic dysfunction of cervical region: Secondary | ICD-10-CM | POA: Diagnosis not present

## 2020-03-01 DIAGNOSIS — M47816 Spondylosis without myelopathy or radiculopathy, lumbar region: Secondary | ICD-10-CM | POA: Diagnosis not present

## 2020-03-01 DIAGNOSIS — M5136 Other intervertebral disc degeneration, lumbar region: Secondary | ICD-10-CM | POA: Diagnosis not present

## 2020-03-01 DIAGNOSIS — M5442 Lumbago with sciatica, left side: Secondary | ICD-10-CM | POA: Diagnosis not present

## 2020-03-01 DIAGNOSIS — M9903 Segmental and somatic dysfunction of lumbar region: Secondary | ICD-10-CM | POA: Diagnosis not present

## 2020-03-08 DIAGNOSIS — M25561 Pain in right knee: Secondary | ICD-10-CM | POA: Diagnosis not present

## 2020-03-08 DIAGNOSIS — M25551 Pain in right hip: Secondary | ICD-10-CM | POA: Diagnosis not present

## 2020-03-08 DIAGNOSIS — M9901 Segmental and somatic dysfunction of cervical region: Secondary | ICD-10-CM | POA: Diagnosis not present

## 2020-03-08 DIAGNOSIS — M9903 Segmental and somatic dysfunction of lumbar region: Secondary | ICD-10-CM | POA: Diagnosis not present

## 2020-03-08 DIAGNOSIS — M9902 Segmental and somatic dysfunction of thoracic region: Secondary | ICD-10-CM | POA: Diagnosis not present

## 2020-03-15 DIAGNOSIS — M5442 Lumbago with sciatica, left side: Secondary | ICD-10-CM | POA: Diagnosis not present

## 2020-03-15 DIAGNOSIS — M5136 Other intervertebral disc degeneration, lumbar region: Secondary | ICD-10-CM | POA: Diagnosis not present

## 2020-03-15 DIAGNOSIS — M9903 Segmental and somatic dysfunction of lumbar region: Secondary | ICD-10-CM | POA: Diagnosis not present

## 2020-03-15 DIAGNOSIS — M47816 Spondylosis without myelopathy or radiculopathy, lumbar region: Secondary | ICD-10-CM | POA: Diagnosis not present

## 2020-03-22 DIAGNOSIS — M9903 Segmental and somatic dysfunction of lumbar region: Secondary | ICD-10-CM | POA: Diagnosis not present

## 2020-03-22 DIAGNOSIS — M9902 Segmental and somatic dysfunction of thoracic region: Secondary | ICD-10-CM | POA: Diagnosis not present

## 2020-03-22 DIAGNOSIS — M9901 Segmental and somatic dysfunction of cervical region: Secondary | ICD-10-CM | POA: Diagnosis not present

## 2020-04-05 DIAGNOSIS — M47816 Spondylosis without myelopathy or radiculopathy, lumbar region: Secondary | ICD-10-CM | POA: Diagnosis not present

## 2020-04-05 DIAGNOSIS — M5136 Other intervertebral disc degeneration, lumbar region: Secondary | ICD-10-CM | POA: Diagnosis not present

## 2020-04-05 DIAGNOSIS — M5442 Lumbago with sciatica, left side: Secondary | ICD-10-CM | POA: Diagnosis not present

## 2020-04-05 DIAGNOSIS — M9903 Segmental and somatic dysfunction of lumbar region: Secondary | ICD-10-CM | POA: Diagnosis not present

## 2020-04-10 DIAGNOSIS — M5442 Lumbago with sciatica, left side: Secondary | ICD-10-CM | POA: Diagnosis not present

## 2020-04-10 DIAGNOSIS — M47816 Spondylosis without myelopathy or radiculopathy, lumbar region: Secondary | ICD-10-CM | POA: Diagnosis not present

## 2020-04-10 DIAGNOSIS — M5136 Other intervertebral disc degeneration, lumbar region: Secondary | ICD-10-CM | POA: Diagnosis not present

## 2020-04-10 DIAGNOSIS — M9903 Segmental and somatic dysfunction of lumbar region: Secondary | ICD-10-CM | POA: Diagnosis not present

## 2020-04-17 DIAGNOSIS — Z1231 Encounter for screening mammogram for malignant neoplasm of breast: Secondary | ICD-10-CM | POA: Diagnosis not present

## 2020-05-01 DIAGNOSIS — M5136 Other intervertebral disc degeneration, lumbar region: Secondary | ICD-10-CM | POA: Diagnosis not present

## 2020-05-01 DIAGNOSIS — M47816 Spondylosis without myelopathy or radiculopathy, lumbar region: Secondary | ICD-10-CM | POA: Diagnosis not present

## 2020-05-01 DIAGNOSIS — M5442 Lumbago with sciatica, left side: Secondary | ICD-10-CM | POA: Diagnosis not present

## 2020-05-01 DIAGNOSIS — M9903 Segmental and somatic dysfunction of lumbar region: Secondary | ICD-10-CM | POA: Diagnosis not present

## 2020-05-08 DIAGNOSIS — M5136 Other intervertebral disc degeneration, lumbar region: Secondary | ICD-10-CM | POA: Diagnosis not present

## 2020-05-08 DIAGNOSIS — M5442 Lumbago with sciatica, left side: Secondary | ICD-10-CM | POA: Diagnosis not present

## 2020-05-08 DIAGNOSIS — M9903 Segmental and somatic dysfunction of lumbar region: Secondary | ICD-10-CM | POA: Diagnosis not present

## 2020-05-08 DIAGNOSIS — M47816 Spondylosis without myelopathy or radiculopathy, lumbar region: Secondary | ICD-10-CM | POA: Diagnosis not present

## 2020-05-25 ENCOUNTER — Encounter: Payer: Self-pay | Admitting: Family Medicine

## 2020-05-28 ENCOUNTER — Telehealth: Payer: Self-pay

## 2020-05-28 NOTE — Telephone Encounter (Signed)
Submitted VOB for SynviscOne, right knee. Pending BV. 

## 2020-05-29 DIAGNOSIS — M5136 Other intervertebral disc degeneration, lumbar region: Secondary | ICD-10-CM | POA: Diagnosis not present

## 2020-05-29 DIAGNOSIS — M5442 Lumbago with sciatica, left side: Secondary | ICD-10-CM | POA: Diagnosis not present

## 2020-05-29 DIAGNOSIS — M47816 Spondylosis without myelopathy or radiculopathy, lumbar region: Secondary | ICD-10-CM | POA: Diagnosis not present

## 2020-05-29 DIAGNOSIS — M9903 Segmental and somatic dysfunction of lumbar region: Secondary | ICD-10-CM | POA: Diagnosis not present

## 2020-06-01 ENCOUNTER — Telehealth: Payer: Self-pay

## 2020-06-01 NOTE — Telephone Encounter (Signed)
PA required for SynviscOne, right knee. Faxed completed PA form to [place] at [phone]. PA Pending

## 2020-06-05 ENCOUNTER — Telehealth: Payer: Self-pay

## 2020-06-05 NOTE — Telephone Encounter (Signed)
Approved for SynviscOne, right knee. [place] Patient will be responsible for 20% OOP Co-pay of $25.00 PA Approval# [id number] Valid 06/01/2020- 09/01/2020

## 2020-06-12 DIAGNOSIS — M9903 Segmental and somatic dysfunction of lumbar region: Secondary | ICD-10-CM | POA: Diagnosis not present

## 2020-06-12 DIAGNOSIS — M47816 Spondylosis without myelopathy or radiculopathy, lumbar region: Secondary | ICD-10-CM | POA: Diagnosis not present

## 2020-06-12 DIAGNOSIS — M5136 Other intervertebral disc degeneration, lumbar region: Secondary | ICD-10-CM | POA: Diagnosis not present

## 2020-06-12 DIAGNOSIS — M5442 Lumbago with sciatica, left side: Secondary | ICD-10-CM | POA: Diagnosis not present

## 2020-06-13 ENCOUNTER — Ambulatory Visit: Payer: Medicare HMO | Admitting: Family Medicine

## 2020-06-13 ENCOUNTER — Other Ambulatory Visit: Payer: Self-pay

## 2020-06-13 DIAGNOSIS — G8929 Other chronic pain: Secondary | ICD-10-CM | POA: Diagnosis not present

## 2020-06-13 DIAGNOSIS — M25511 Pain in right shoulder: Secondary | ICD-10-CM

## 2020-06-13 DIAGNOSIS — M1711 Unilateral primary osteoarthritis, right knee: Secondary | ICD-10-CM | POA: Diagnosis not present

## 2020-06-13 NOTE — Progress Notes (Signed)
Office Visit Note   Patient: Tara Fernandez           Date of Birth: 10-28-1950           MRN: [id number] Visit Date: 06/13/2020 Requested by: Eunice Blase, MD [address] [place],  [place] [address] PCP: Eunice Blase, MD  Subjective: Chief Complaint  Patient presents with  . Right Knee - Follow-up, Pain    Planned right knee Synvisc One injection. She wants to have this done in the medial aspect of the knee. Also, she tans once a week --- should she avoid this for a few weeks after getting the gel injection?  . Right Shoulder - Pain    Pain in the anterior shoulder, with decreased ROM. Requests cortisone injection.  . Other    Recheck of blood work today?    HPI: She is here for planned right knee Synvisc 1 injection.  She also is having pain in the anterior right shoulder, especially when reaching behind her back.  She has limited range of motion as well.  She would like an injection here.                ROS:   All other systems were reviewed and are negative.  Objective: Vital Signs: LMP 01/07/1996 (Approximate)   Physical Exam:  General:  Alert and oriented, in no acute distress. Pulm:  Breathing unlabored. Psy:  Normal mood, congruent affect.  Right shoulder: She has decreased internal rotation motion compared to the left.  She is tender along the long head biceps tendon.  Rotator cuff strength is 5/5.  Speeds test is mildly positive. Right knee: 1-2+ effusion with no warmth or erythema.    Imaging: No results found.  Assessment & Plan: 1.  Right knee osteoarthritis - Aspiration and Synvisc 1 injection today.  Follow-up as needed.  2.  Right shoulder pain, suspect long head biceps tendinopathy - Injection today.     Procedures: Right knee injection: After sterile prep with Betadine, injected 5 cc 1% lidocaine without epinephrine, then aspirated about 8 cc of blood-tinged synovial fluid, then injected Synvisc-1 from superomedial approach.  Right  shoulder injection: After sterile prep with Betadine, injected 3 cc 1% lidocaine without epinephrine and 6 mg betamethasone into the long head biceps tendon area.       PMFS History: Patient Active Problem List   Diagnosis Date Noted  . Chronic right shoulder pain 03/02/2019  . Chondromalacia of right patellofemoral joint 05/18/2013  . Leg length discrepancy 04/15/2013  . Arthritis of right hip 02/11/2013  . Status post THR (total hip replacement) 02/11/2013  . Degenerative arthritis of hip 06/17/2012   Past Medical History:  Diagnosis Date  . Arthritis    osteoarthritis; PAIN RIGHT HIP  . PONV (postoperative nausea and vomiting)     Family History  Problem Relation Age of Onset  . Heart attack Father   . Cancer Father   . Tongue cancer Father   . Breast cancer Sister 14  . Lung cancer Sister   . Cancer Sister   . Lung cancer Mother   . Cancer Mother   . Lupus Sister   . Cancer Sister   . Breast cancer Sister   . Healthy Brother   . Cancer Maternal Aunt   . Cancer Maternal Uncle   . Cancer Paternal Aunt   . Cancer Paternal Uncle   . Healthy Brother   . Diabetes Neg Hx   . Hypertension Neg Hx   .  Thyroid disease Neg Hx     Past Surgical History:  Procedure Laterality Date  . ANTERIOR AND POSTERIOR REPAIR N/A 03/05/2015   Procedure: ANTERIOR (CYSTOCELE) REPAIR, VAULT PROLAPSE AND GRAFT;  Surgeon: Bjorn Loser, MD;  Location: [facility];  Service: Urology;  Laterality: N/A;  . BREAST MASS EXCISION     Hx: of left breast  . CARTILAGE SURGERY  10 th grade   RIGHT KNEE  . CYSTOSCOPY N/A 03/05/2015   Procedure: CYSTOSCOPY;  Surgeon: Bjorn Loser, MD;  Location: [facility];  Service: Urology;  Laterality: N/A;  . LAPAROSCOPIC VAGINAL HYSTERECTOMY WITH SALPINGECTOMY Bilateral 03/05/2015   Procedure: LAPAROSCOPIC ASSISTED VAGINAL HYSTERECTOMY WITH SALPINGECTOMY;  Surgeon: Megan Salon, MD;  Location: [facility];  Service: Gynecology;  Laterality: Bilateral;  . TOTAL HIP  ARTHROPLASTY Left 06/17/2012   Procedure: LEFT TOTAL HIP ARTHROPLASTY ANTERIOR APPROACH;  Surgeon: Mcarthur Rossetti, MD;  Location: [facility];  Service: Orthopedics;  Laterality: Left;  . TOTAL HIP ARTHROPLASTY Right 02/11/2013   Procedure: RIGHT TOTAL HIP ARTHROPLASTY ANTERIOR APPROACH;  Surgeon: Mcarthur Rossetti, MD;  Location: [facility];  Service: Orthopedics;  Laterality: Right;   Social History   Occupational History  . Not on file  Tobacco Use  . Smoking status: Never Smoker  . Smokeless tobacco: Never Used  Substance and Sexual Activity  . Alcohol use: Not Currently  . Drug use: No  . Sexual activity: Yes    Partners: Male    Birth control/protection: Post-menopausal, Surgical    Comment: hysterectomy

## 2020-06-15 ENCOUNTER — Ambulatory Visit: Payer: Medicare HMO | Admitting: Family Medicine

## 2020-06-20 ENCOUNTER — Other Ambulatory Visit: Payer: Self-pay

## 2020-06-20 ENCOUNTER — Ambulatory Visit (INDEPENDENT_AMBULATORY_CARE_PROVIDER_SITE_OTHER): Payer: Medicare HMO

## 2020-06-20 DIAGNOSIS — E785 Hyperlipidemia, unspecified: Secondary | ICD-10-CM | POA: Diagnosis not present

## 2020-06-20 NOTE — Progress Notes (Signed)
Lab visit only: fasting Cardio IQ Advanced lipid and inflammation panel drawn per Dr. Junius Roads.

## 2020-06-24 LAB — CARDIO IQ ADV LIPID AND INFLAMM PNL
Apolipoprotein B: 97 mg/dL — ABNORMAL HIGH (ref <90)
Cholesterol: 233 mg/dL — ABNORMAL HIGH (ref <200)
HDL: 82 mg/dL (ref >49)
LDL Cholesterol (Calc): 137 mg/dL (calc) — ABNORMAL HIGH (ref <100)
LDL Large: 8212 nmol/L (ref >6729)
LDL Medium: 266 nmol/L — ABNORMAL HIGH (ref <215)
LDL Particle Number: 1547 nmol/L — ABNORMAL HIGH (ref <1138)
LDL Peak Size: 227.3 Angstrom (ref >222.9)
LDL Small: 179 nmol/L — ABNORMAL HIGH (ref <142)
Lipoprotein (a): 34 nmol/L (ref <75)
Non-HDL Cholesterol (Calc): 151 mg/dL (calc) — ABNORMAL HIGH (ref <130)
PLAC: 123 nmol/min/mL (ref <124)
Total CHOL/HDL Ratio: 2.8 calc (ref <3.6)
Triglycerides: 57 mg/dL (ref <150)
hs-CRP: 0.6 mg/L (ref <1.0)

## 2020-06-25 ENCOUNTER — Telehealth: Payer: Self-pay | Admitting: Family Medicine

## 2020-06-25 NOTE — Telephone Encounter (Signed)
Order faxed to [facility], they will contact pt to schedule appt

## 2020-06-25 NOTE — Telephone Encounter (Signed)
Labs are notable for the following:  Total cholesterol and LDL have stayed about the same.  HDL and triglycerides look excellent.  LDL particle sizes are suboptimal, and apolipoprotein B is slightly elevated.  These things increase your risk of cardiovascular disease.  I recommend considering a CT calcium score test to look for signs of plaque buildup in the heart arteries.  This will help [place] decide how aggressive to be with lipid management.  Exercise and healthy diet remain extremely important.

## 2020-06-25 NOTE — Addendum Note (Signed)
Addended by: Hortencia Pilar on: 06/25/2020 08:16 AM   Modules accepted: Orders

## 2020-06-26 DIAGNOSIS — M5442 Lumbago with sciatica, left side: Secondary | ICD-10-CM | POA: Diagnosis not present

## 2020-06-26 DIAGNOSIS — M9903 Segmental and somatic dysfunction of lumbar region: Secondary | ICD-10-CM | POA: Diagnosis not present

## 2020-06-26 DIAGNOSIS — M47816 Spondylosis without myelopathy or radiculopathy, lumbar region: Secondary | ICD-10-CM | POA: Diagnosis not present

## 2020-06-26 DIAGNOSIS — M5136 Other intervertebral disc degeneration, lumbar region: Secondary | ICD-10-CM | POA: Diagnosis not present

## 2020-06-28 DIAGNOSIS — M47816 Spondylosis without myelopathy or radiculopathy, lumbar region: Secondary | ICD-10-CM | POA: Diagnosis not present

## 2020-06-28 DIAGNOSIS — M5136 Other intervertebral disc degeneration, lumbar region: Secondary | ICD-10-CM | POA: Diagnosis not present

## 2020-06-28 DIAGNOSIS — M5442 Lumbago with sciatica, left side: Secondary | ICD-10-CM | POA: Diagnosis not present

## 2020-06-28 DIAGNOSIS — M9903 Segmental and somatic dysfunction of lumbar region: Secondary | ICD-10-CM | POA: Diagnosis not present

## 2020-07-02 ENCOUNTER — Ambulatory Visit (HOSPITAL_BASED_OUTPATIENT_CLINIC_OR_DEPARTMENT_OTHER): Payer: Medicare HMO | Admitting: Family Medicine

## 2020-07-05 ENCOUNTER — Encounter (HOSPITAL_BASED_OUTPATIENT_CLINIC_OR_DEPARTMENT_OTHER): Payer: Self-pay | Admitting: Family Medicine

## 2020-07-05 ENCOUNTER — Telehealth: Payer: Self-pay | Admitting: Family Medicine

## 2020-07-05 ENCOUNTER — Other Ambulatory Visit: Payer: Self-pay

## 2020-07-05 ENCOUNTER — Ambulatory Visit (INDEPENDENT_AMBULATORY_CARE_PROVIDER_SITE_OTHER): Payer: Medicare HMO | Admitting: Family Medicine

## 2020-07-05 DIAGNOSIS — S4991XA Unspecified injury of right shoulder and upper arm, initial encounter: Secondary | ICD-10-CM | POA: Diagnosis not present

## 2020-07-05 NOTE — Patient Instructions (Signed)
  Medication Instructions:  Your physician recommends that you continue on your current medications as directed. Please refer to the Current Medication list given to you today. --If you need a refill on any your medications before your next appointment, please call your pharmacy first. If no refills are authorized on file call the office.-- Referrals/Procedures/Imaging: Your physician recommends that you have an X-ray of Right Shoulder.  X-rays use invisible electromagnetic energy beams to produce images of internal tissues, bones, and organs on film or digital media. Standard X-rays are performed for many reasons, including diagnosing tumors or bone injuries.    You may have these images done at the [facility] located at [facility] at [facility] on the ground floor, [phone]. They are a walk-in imaging facility. The hours of operation are:   Monday -- 7:30 AM - 5:00 PM Tuesday -- 7:30 AM - 5:00 PM Wednesday -- 7:30 AM - 5:00 PM Thursday -- 7:30 AM - 5:00 PM Friday -- 7:30 AM - 5:00 PM Saturday -- Closed Sunday -- Closed  Follow-Up: Your next appointment:   Your physician recommends that you schedule a follow-up appointment with Dr. de [place] based on your imaging  Thanks for letting us be apart of your health journey!!  Primary Care and Sports Medicine   Dr. Arlina Robes [place]   We encourage you to activate your patient portal called "MyChart".  Sign up information is provided on this After Visit Summary.  MyChart is used to connect with patients for Virtual Visits (Telemedicine).  Patients are able to view lab/test results, encounter notes, upcoming appointments, etc.  Non-urgent messages can be sent to your provider as well. To learn more about what you can do with MyChart, please visit --  [email or web address].

## 2020-07-05 NOTE — Telephone Encounter (Signed)
CT calcium score is 11.  This is a very low score, and suggests only a miminal amount of plaque buildup in the left main artery (all other arteries were score of 0).

## 2020-07-05 NOTE — Assessment & Plan Note (Signed)
Patient with acute injury in the setting of chronic shoulder pain Recommend initial evaluation with x-rays Discussed conservative measures including OTC medications, ice, relative rest If imaging is normal, patient does have notable limitation range of motion both actively and passively, suggesting some component of adhesive capsulitis for which she would likely benefit from physical therapy, patient seems hesitant to proceed with this at present

## 2020-07-05 NOTE — Progress Notes (Signed)
New Patient Office Visit  Subjective:  Patient ID: Tara Fernandez, female    DOB: [birth year]  Age: 70 y.o. MRN: [id number]  CC:  Chief Complaint  Patient presents with   Establish Care    Prior PCP Dr. Junius Roads   Shoulder Pain    Patient sustained a fall earlier today and injured her right arm and right shoulder. She has some abrasions on the back of her right arm. Pt states the pain is "uncomfortable" at the moment but doesn't hurt as bas as when she fell earlier. Patient states she was walking some dogs and they got away from her and made her fall and dragged her.    Steriod Injections    Patient would like to discuss Visco injection as well as a "gel" injection that she has been getting from her previous PCP. Patient states she is not a fan of traditional medicine and doesn't "take medications" so she wants to stay open minded to alternatives.     HPI Tara Fernandez is a 70 year old female presenting to establish in clinic.  Past medical history has mainly been related to MSK issues, particularly with bilateral hip replacement, osteoarthritis of right knee, chronic shoulder pain.  She has current concerns today related to acute onset shoulder pain due to injury today.  Right shoulder injury: Reports that she was walking a dog today and during the walk the dog lunged, pulling her to the ground.  She was holding the leash in her right hand and thus this yanked at her arm and the dog dragged her for a short distance on the ground.  She does have some abrasions posteriorly over her right upper arm and right back.  Patient has had prior shoulder issues for which she is worked with her primary care provider and has had injections into the right shoulder related to these issues.  She feels that the pain is somewhat lessened from earlier today.  Does have some limitation in her range of motion due to the pain.  Patient is originally from [place], but has been in the [place] area for about 15  years.  She enjoys doing yoga, walking, reading.  Does prefer to utilize alternative therapies, is on various herbal supplements.  Past Medical History:  Diagnosis Date   Arthritis    osteoarthritis; PAIN RIGHT HIP   PONV (postoperative nausea and vomiting)     Past Surgical History:  Procedure Laterality Date   ANTERIOR AND POSTERIOR REPAIR N/A 03/05/2015   Procedure: ANTERIOR (CYSTOCELE) REPAIR, VAULT PROLAPSE AND GRAFT;  Surgeon: Bjorn Loser, MD;  Location: [facility];  Service: Urology;  Laterality: N/A;   BREAST MASS EXCISION     Hx: of left breast   CARTILAGE SURGERY  10 th grade   RIGHT KNEE   CYSTOSCOPY N/A 03/05/2015   Procedure: CYSTOSCOPY;  Surgeon: Bjorn Loser, MD;  Location: [facility];  Service: Urology;  Laterality: N/A;   LAPAROSCOPIC VAGINAL HYSTERECTOMY WITH SALPINGECTOMY Bilateral 03/05/2015   Procedure: LAPAROSCOPIC ASSISTED VAGINAL HYSTERECTOMY WITH SALPINGECTOMY;  Surgeon: Megan Salon, MD;  Location: [facility];  Service: Gynecology;  Laterality: Bilateral;   TOTAL HIP ARTHROPLASTY Left 06/17/2012   Procedure: LEFT TOTAL HIP ARTHROPLASTY ANTERIOR APPROACH;  Surgeon: Mcarthur Rossetti, MD;  Location: [facility];  Service: Orthopedics;  Laterality: Left;   TOTAL HIP ARTHROPLASTY Right 02/11/2013   Procedure: RIGHT TOTAL HIP ARTHROPLASTY ANTERIOR APPROACH;  Surgeon: Mcarthur Rossetti, MD;  Location: [facility];  Service: Orthopedics;  Laterality: Right;  Family History  Problem Relation Age of Onset   Heart attack Father    Cancer Father    Tongue cancer Father    Breast cancer Sister 22   Lung cancer Sister    Cancer Sister    Lung cancer Mother    Cancer Mother    Lupus Sister    Cancer Sister    Breast cancer Sister    Healthy Brother    Cancer Maternal Aunt    Cancer Maternal Uncle    Cancer Paternal Aunt    Cancer Paternal Uncle    Healthy Brother    Diabetes Neg Hx    Hypertension Neg Hx    Thyroid disease Neg Hx     Social History    Socioeconomic History   Marital status: Married    Spouse name: Not on file   Number of children: Not on file   Years of education: Not on file   Highest education level: Not on file  Occupational History   Not on file  Tobacco Use   Smoking status: Never   Smokeless tobacco: Never  Substance and Sexual Activity   Alcohol use: Not Currently   Drug use: No   Sexual activity: Yes    Partners: Male    Birth control/protection: Post-menopausal, Surgical    Comment: hysterectomy  Other Topics Concern   Not on file  Social History Narrative   Not on file   Social Determinants of Health   Financial Resource Strain: Not on file  Food Insecurity: Not on file  Transportation Needs: Not on file  Physical Activity: Not on file  Stress: Not on file  Social Connections: Not on file  Intimate Partner Violence: Not on file    Objective:   Today's Vitals: BP 112/74   Pulse 64   Ht 5\' 4"  (1.626 m)   Wt 140 lb (63.5 kg)   LMP 01/07/1996 (Approximate)   SpO2 98%   BMI 24.03 kg/m   Physical Exam  70 year old female in no acute distress Cardiovascular exam with regular rate and rhythm, no murmurs appreciated Lungs clear to auscultation bilaterally Right shoulder: Obvious swelling, bruising or erythema: absent Deformity of the shoulder: absent Active ROM: diminished range with pain Passive ROM: diminished range with pain Strength: abduction 4/5, external rotation with shoulder at side 4/5 Empty can: Positive Hawkins: Positive Neer's: Positive Neurovascular exam: intact   Assessment & Plan:   Problem List Items Addressed This Visit       Other   Right shoulder injury, initial encounter    Patient with acute injury in the setting of chronic shoulder pain Recommend initial evaluation with x-rays Discussed conservative measures including OTC medications, ice, relative rest If imaging is normal, patient does have notable limitation range of motion both actively and  passively, suggesting some component of adhesive capsulitis for which she would likely benefit from physical therapy, patient seems hesitant to proceed with this at present       Relevant Orders   DG Shoulder Right    Outpatient Encounter Medications as of 07/05/2020  Medication Sig   Ascorbic Acid (VITAMIN C) 1000 MG tablet Take 1,000 mg by mouth daily.   B Complex-C (B-COMPLEX WITH VITAMIN C) tablet Take 1 tablet by mouth daily.   Boswellia Serrata (BOSWELLIA PO) Take by mouth in the morning and at bedtime.   ELDERBERRY PO Take by mouth.   ibuprofen (ADVIL,MOTRIN) 200 MG tablet Take 200 mg by mouth every 6 (six) hours as needed.  NONFORMULARY OR COMPOUNDED ITEM daily. Bacopa herbal supplement for mental alertness, 1 daily   Nutritional Supplements (BEE POLLEN/ROYAL JELLY/HONEY PO) Take by mouth.   RED CLOVER LEAF EXTRACT PO Take by mouth.   S-Adenosylmethionine (SAM-E PO) Take by mouth.   TURMERIC-GINGER PO Take by mouth.   No facility-administered encounter medications on file as of 07/05/2020.   Spent 45 minutes on this patient encounter, including preparation, chart review, face-to-face counseling with patient and coordination of care, and documentation of encounter  Follow-up: No follow-ups on file.  Follow-up to be determined after imaging completed and pending progress with conservative treatment  Deshaun Schou J De [place]am, MD

## 2020-07-06 ENCOUNTER — Ambulatory Visit (HOSPITAL_BASED_OUTPATIENT_CLINIC_OR_DEPARTMENT_OTHER)
Admission: RE | Admit: 2020-07-06 | Discharge: 2020-07-06 | Disposition: A | Discharge status: Home / Self Care | Payer: Medicare HMO | Source: Ambulatory Visit | Attending: Family Medicine | Admitting: Family Medicine

## 2020-07-06 DIAGNOSIS — M25511 Pain in right shoulder: Secondary | ICD-10-CM | POA: Diagnosis not present

## 2020-07-06 DIAGNOSIS — S4991XA Unspecified injury of right shoulder and upper arm, initial encounter: Secondary | ICD-10-CM | POA: Diagnosis not present

## 2020-07-24 ENCOUNTER — Telehealth (HOSPITAL_BASED_OUTPATIENT_CLINIC_OR_DEPARTMENT_OTHER): Payer: Self-pay

## 2020-07-24 DIAGNOSIS — M9903 Segmental and somatic dysfunction of lumbar region: Secondary | ICD-10-CM | POA: Diagnosis not present

## 2020-07-24 DIAGNOSIS — S4991XA Unspecified injury of right shoulder and upper arm, initial encounter: Secondary | ICD-10-CM

## 2020-07-24 DIAGNOSIS — M47816 Spondylosis without myelopathy or radiculopathy, lumbar region: Secondary | ICD-10-CM | POA: Diagnosis not present

## 2020-07-24 DIAGNOSIS — M5136 Other intervertebral disc degeneration, lumbar region: Secondary | ICD-10-CM | POA: Diagnosis not present

## 2020-07-24 DIAGNOSIS — M5442 Lumbago with sciatica, left side: Secondary | ICD-10-CM | POA: Diagnosis not present

## 2020-07-24 NOTE — Telephone Encounter (Signed)
Results released by Dr. de Guam and reviewed by patient via [place] patient to contact the office with any questions or concerns. Patient is scheduled for 07/21 for shoulder injection

## 2020-07-24 NOTE — Telephone Encounter (Signed)
-----   Message from Raymond J de Guam, MD sent at 07/23/2020  9:05 AM EDT ----- I discussed imaging findings and treatment options with patient on the phone. She elected to proceed with ultrasound guided injection and referral to PT. If we can schedule her for an office visit for procedure later this week or next week and place referral to PT. Thank you.

## 2020-07-25 ENCOUNTER — Ambulatory Visit (INDEPENDENT_AMBULATORY_CARE_PROVIDER_SITE_OTHER): Payer: Medicare HMO | Admitting: Family Medicine

## 2020-07-25 ENCOUNTER — Other Ambulatory Visit: Payer: Self-pay

## 2020-07-25 DIAGNOSIS — M7501 Adhesive capsulitis of right shoulder: Secondary | ICD-10-CM | POA: Diagnosis not present

## 2020-07-25 NOTE — Assessment & Plan Note (Signed)
70 year old female with right shoulder pain secondary to adhesive capsulitis with likely component of impingement, scapular dyskinesis. Previously discussed treatment options including ultrasound-guided steroid injection, physical therapy Wishing to proceed with injection today, procedure note as above Discussed risks, benefits with patient All questions answered regarding procedure, expected outcomes She will be arranging to start physical therapy in the next 1 to 2 weeks

## 2020-07-25 NOTE — Patient Instructions (Signed)
  Medication Instructions:  Your physician recommends that you continue on your current medications as directed. Please refer to the Current Medication list given to you today. --If you need a refill on any your medications before your next appointment, please call your pharmacy first. If no refills are authorized on file call the office.- Follow-Up: Your next appointment:   Your physician recommends that you schedule a follow-up appointment in: 3 MONTHS with Dr. de Guam  Thanks for letting us be apart of your health journey!!  Primary Care and Sports Medicine   Dr. Arlina Robes [place]   We encourage you to activate your patient portal called "MyChart".  Sign up information is provided on this After Visit Summary.  MyChart is used to connect with patients for Virtual Visits (Telemedicine).  Patients are able to view lab/test results, encounter notes, upcoming appointments, etc.  Non-urgent messages can be sent to your provider as well. To learn more about what you can do with MyChart, please visit --  [email or web address].

## 2020-07-25 NOTE — Progress Notes (Signed)
    Procedures performed today:    Procedure: Real-time Ultrasound Guided injection of the right glenohumeral joint Device: Samsung HS60  Verbal informed consent obtained.  Time-out conducted.  Noted no overlying erythema, induration, or other signs of local infection.  Skin prepped in a sterile fashion.  Local anesthesia: None With sterile technique and under real time ultrasound guidance: 1 cc Kenalog 40, 4 cc lidocaine injected easily Completed without difficulty  Advised to call if fevers/chills, erythema, induration, drainage, or persistent bleeding.  Images permanently stored and available for review in PACS.  Impression: Technically successful ultrasound guided injection.  Independent interpretation of notes and tests performed by another provider:   None.  Brief History, Exam, Impression, and Recommendations:    Adhesive capsulitis of right shoulder 70 year old female with right shoulder pain secondary to adhesive capsulitis with likely component of impingement, scapular dyskinesis. Previously discussed treatment options including ultrasound-guided steroid injection, physical therapy Wishing to proceed with injection today, procedure note as above Discussed risks, benefits with patient All questions answered regarding procedure, expected outcomes She will be arranging to start physical therapy in the next 1 to 2 weeks  We will plan to follow-up in about 3 months to monitor progress or sooner as needed   ___________________________________________ Ivory Maduro de [place], MD, ABFM, [facility] Primary Care and [place]

## 2020-07-26 ENCOUNTER — Ambulatory Visit (HOSPITAL_BASED_OUTPATIENT_CLINIC_OR_DEPARTMENT_OTHER): Payer: Medicare HMO | Admitting: Family Medicine

## 2020-07-26 ENCOUNTER — Encounter (HOSPITAL_BASED_OUTPATIENT_CLINIC_OR_DEPARTMENT_OTHER): Payer: Self-pay | Admitting: Family Medicine

## 2020-08-02 DIAGNOSIS — M5442 Lumbago with sciatica, left side: Secondary | ICD-10-CM | POA: Diagnosis not present

## 2020-08-02 DIAGNOSIS — M47816 Spondylosis without myelopathy or radiculopathy, lumbar region: Secondary | ICD-10-CM | POA: Diagnosis not present

## 2020-08-02 DIAGNOSIS — M5136 Other intervertebral disc degeneration, lumbar region: Secondary | ICD-10-CM | POA: Diagnosis not present

## 2020-08-02 DIAGNOSIS — M9903 Segmental and somatic dysfunction of lumbar region: Secondary | ICD-10-CM | POA: Diagnosis not present

## 2020-08-07 DIAGNOSIS — M5136 Other intervertebral disc degeneration, lumbar region: Secondary | ICD-10-CM | POA: Diagnosis not present

## 2020-08-07 DIAGNOSIS — M47816 Spondylosis without myelopathy or radiculopathy, lumbar region: Secondary | ICD-10-CM | POA: Diagnosis not present

## 2020-08-07 DIAGNOSIS — M5442 Lumbago with sciatica, left side: Secondary | ICD-10-CM | POA: Diagnosis not present

## 2020-08-07 DIAGNOSIS — M9903 Segmental and somatic dysfunction of lumbar region: Secondary | ICD-10-CM | POA: Diagnosis not present

## 2020-08-08 ENCOUNTER — Ambulatory Visit (HOSPITAL_BASED_OUTPATIENT_CLINIC_OR_DEPARTMENT_OTHER): Payer: Medicare HMO | Admitting: Physical Therapy

## 2020-08-16 ENCOUNTER — Ambulatory Visit (HOSPITAL_BASED_OUTPATIENT_CLINIC_OR_DEPARTMENT_OTHER): Payer: Medicare HMO | Admitting: Physical Therapy

## 2020-08-16 DIAGNOSIS — M47816 Spondylosis without myelopathy or radiculopathy, lumbar region: Secondary | ICD-10-CM | POA: Diagnosis not present

## 2020-08-16 DIAGNOSIS — M9903 Segmental and somatic dysfunction of lumbar region: Secondary | ICD-10-CM | POA: Diagnosis not present

## 2020-08-16 DIAGNOSIS — M5136 Other intervertebral disc degeneration, lumbar region: Secondary | ICD-10-CM | POA: Diagnosis not present

## 2020-08-16 DIAGNOSIS — M5442 Lumbago with sciatica, left side: Secondary | ICD-10-CM | POA: Diagnosis not present

## 2020-08-17 ENCOUNTER — Ambulatory Visit (HOSPITAL_BASED_OUTPATIENT_CLINIC_OR_DEPARTMENT_OTHER): Payer: Medicare HMO | Admitting: Physical Therapy

## 2020-08-20 DIAGNOSIS — M25511 Pain in right shoulder: Secondary | ICD-10-CM | POA: Diagnosis not present

## 2020-08-21 DIAGNOSIS — M47816 Spondylosis without myelopathy or radiculopathy, lumbar region: Secondary | ICD-10-CM | POA: Diagnosis not present

## 2020-08-21 DIAGNOSIS — M5442 Lumbago with sciatica, left side: Secondary | ICD-10-CM | POA: Diagnosis not present

## 2020-08-21 DIAGNOSIS — M5136 Other intervertebral disc degeneration, lumbar region: Secondary | ICD-10-CM | POA: Diagnosis not present

## 2020-08-21 DIAGNOSIS — M9903 Segmental and somatic dysfunction of lumbar region: Secondary | ICD-10-CM | POA: Diagnosis not present

## 2020-08-23 DIAGNOSIS — M25511 Pain in right shoulder: Secondary | ICD-10-CM | POA: Diagnosis not present

## 2020-08-28 DIAGNOSIS — M5442 Lumbago with sciatica, left side: Secondary | ICD-10-CM | POA: Diagnosis not present

## 2020-08-28 DIAGNOSIS — M47816 Spondylosis without myelopathy or radiculopathy, lumbar region: Secondary | ICD-10-CM | POA: Diagnosis not present

## 2020-08-28 DIAGNOSIS — M5136 Other intervertebral disc degeneration, lumbar region: Secondary | ICD-10-CM | POA: Diagnosis not present

## 2020-08-28 DIAGNOSIS — M9903 Segmental and somatic dysfunction of lumbar region: Secondary | ICD-10-CM | POA: Diagnosis not present

## 2020-08-30 ENCOUNTER — Ambulatory Visit (HOSPITAL_BASED_OUTPATIENT_CLINIC_OR_DEPARTMENT_OTHER): Payer: Self-pay

## 2020-08-30 DIAGNOSIS — M7501 Adhesive capsulitis of right shoulder: Secondary | ICD-10-CM

## 2020-08-30 DIAGNOSIS — H26493 Other secondary cataract, bilateral: Secondary | ICD-10-CM | POA: Diagnosis not present

## 2020-08-30 NOTE — Addendum Note (Signed)
Addended by: Cyd Silence on: 08/30/2020 02:38 PM   Modules accepted: Orders

## 2020-08-30 NOTE — Addendum Note (Signed)
Addended by: Cyd Silence on: 08/30/2020 07:37 PM   Modules accepted: Orders

## 2020-08-31 ENCOUNTER — Encounter (HOSPITAL_BASED_OUTPATIENT_CLINIC_OR_DEPARTMENT_OTHER): Payer: Self-pay | Admitting: Family Medicine

## 2020-08-31 ENCOUNTER — Ambulatory Visit (INDEPENDENT_AMBULATORY_CARE_PROVIDER_SITE_OTHER): Payer: Medicare HMO

## 2020-08-31 ENCOUNTER — Ambulatory Visit (HOSPITAL_BASED_OUTPATIENT_CLINIC_OR_DEPARTMENT_OTHER): Payer: Self-pay

## 2020-08-31 DIAGNOSIS — G8929 Other chronic pain: Secondary | ICD-10-CM

## 2020-08-31 DIAGNOSIS — M7501 Adhesive capsulitis of right shoulder: Secondary | ICD-10-CM | POA: Diagnosis not present

## 2020-08-31 DIAGNOSIS — M25569 Pain in unspecified knee: Secondary | ICD-10-CM

## 2020-08-31 NOTE — Addendum Note (Signed)
Addended by: Cyd Silence on: 08/31/2020 10:49 AM   Modules accepted: Orders

## 2020-09-04 ENCOUNTER — Ambulatory Visit: Payer: Medicare HMO

## 2020-09-04 DIAGNOSIS — M47816 Spondylosis without myelopathy or radiculopathy, lumbar region: Secondary | ICD-10-CM | POA: Diagnosis not present

## 2020-09-04 DIAGNOSIS — M9903 Segmental and somatic dysfunction of lumbar region: Secondary | ICD-10-CM | POA: Diagnosis not present

## 2020-09-04 DIAGNOSIS — M5136 Other intervertebral disc degeneration, lumbar region: Secondary | ICD-10-CM | POA: Diagnosis not present

## 2020-09-04 DIAGNOSIS — M5442 Lumbago with sciatica, left side: Secondary | ICD-10-CM | POA: Diagnosis not present

## 2020-09-12 ENCOUNTER — Encounter (HOSPITAL_BASED_OUTPATIENT_CLINIC_OR_DEPARTMENT_OTHER): Payer: Self-pay | Admitting: Family Medicine

## 2020-09-14 ENCOUNTER — Telehealth: Payer: Self-pay | Admitting: Family Medicine

## 2020-09-14 NOTE — Telephone Encounter (Signed)
Pt called requesting a CD Xray. Please call pt when ready for pick up at [phone].

## 2020-09-17 NOTE — Telephone Encounter (Signed)
Advised patient that CD is ready for pickup.

## 2020-09-18 DIAGNOSIS — M47816 Spondylosis without myelopathy or radiculopathy, lumbar region: Secondary | ICD-10-CM | POA: Diagnosis not present

## 2020-09-18 DIAGNOSIS — M9903 Segmental and somatic dysfunction of lumbar region: Secondary | ICD-10-CM | POA: Diagnosis not present

## 2020-09-18 DIAGNOSIS — M5442 Lumbago with sciatica, left side: Secondary | ICD-10-CM | POA: Diagnosis not present

## 2020-09-18 DIAGNOSIS — M5136 Other intervertebral disc degeneration, lumbar region: Secondary | ICD-10-CM | POA: Diagnosis not present

## 2020-09-27 DIAGNOSIS — M5442 Lumbago with sciatica, left side: Secondary | ICD-10-CM | POA: Diagnosis not present

## 2020-09-27 DIAGNOSIS — M5136 Other intervertebral disc degeneration, lumbar region: Secondary | ICD-10-CM | POA: Diagnosis not present

## 2020-09-27 DIAGNOSIS — M47816 Spondylosis without myelopathy or radiculopathy, lumbar region: Secondary | ICD-10-CM | POA: Diagnosis not present

## 2020-09-27 DIAGNOSIS — M9903 Segmental and somatic dysfunction of lumbar region: Secondary | ICD-10-CM | POA: Diagnosis not present

## 2020-10-10 DIAGNOSIS — M1711 Unilateral primary osteoarthritis, right knee: Secondary | ICD-10-CM | POA: Diagnosis not present

## 2020-10-10 DIAGNOSIS — M25561 Pain in right knee: Secondary | ICD-10-CM | POA: Diagnosis not present

## 2020-10-16 DIAGNOSIS — M9903 Segmental and somatic dysfunction of lumbar region: Secondary | ICD-10-CM | POA: Diagnosis not present

## 2020-10-16 DIAGNOSIS — M5136 Other intervertebral disc degeneration, lumbar region: Secondary | ICD-10-CM | POA: Diagnosis not present

## 2020-10-16 DIAGNOSIS — M47816 Spondylosis without myelopathy or radiculopathy, lumbar region: Secondary | ICD-10-CM | POA: Diagnosis not present

## 2020-10-16 DIAGNOSIS — M5442 Lumbago with sciatica, left side: Secondary | ICD-10-CM | POA: Diagnosis not present

## 2020-10-25 ENCOUNTER — Ambulatory Visit (HOSPITAL_BASED_OUTPATIENT_CLINIC_OR_DEPARTMENT_OTHER): Payer: Medicare HMO | Admitting: Family Medicine

## 2020-10-30 DIAGNOSIS — M9903 Segmental and somatic dysfunction of lumbar region: Secondary | ICD-10-CM | POA: Diagnosis not present

## 2020-10-30 DIAGNOSIS — M5442 Lumbago with sciatica, left side: Secondary | ICD-10-CM | POA: Diagnosis not present

## 2020-10-30 DIAGNOSIS — M5136 Other intervertebral disc degeneration, lumbar region: Secondary | ICD-10-CM | POA: Diagnosis not present

## 2020-10-30 DIAGNOSIS — M47816 Spondylosis without myelopathy or radiculopathy, lumbar region: Secondary | ICD-10-CM | POA: Diagnosis not present

## 2020-11-07 ENCOUNTER — Encounter (HOSPITAL_BASED_OUTPATIENT_CLINIC_OR_DEPARTMENT_OTHER): Payer: Self-pay | Admitting: Family Medicine

## 2020-11-07 DIAGNOSIS — Z1211 Encounter for screening for malignant neoplasm of colon: Secondary | ICD-10-CM

## 2020-11-08 NOTE — Addendum Note (Signed)
Addended by: Bobby Rumpf C on: 11/08/2020 11:11 AM   Modules accepted: Orders

## 2020-11-13 DIAGNOSIS — M9903 Segmental and somatic dysfunction of lumbar region: Secondary | ICD-10-CM | POA: Diagnosis not present

## 2020-11-13 DIAGNOSIS — M5442 Lumbago with sciatica, left side: Secondary | ICD-10-CM | POA: Diagnosis not present

## 2020-11-13 DIAGNOSIS — M5136 Other intervertebral disc degeneration, lumbar region: Secondary | ICD-10-CM | POA: Diagnosis not present

## 2020-11-13 DIAGNOSIS — M47816 Spondylosis without myelopathy or radiculopathy, lumbar region: Secondary | ICD-10-CM | POA: Diagnosis not present

## 2020-11-14 ENCOUNTER — Encounter (HOSPITAL_BASED_OUTPATIENT_CLINIC_OR_DEPARTMENT_OTHER): Payer: Self-pay | Admitting: Family Medicine

## 2020-11-15 ENCOUNTER — Ambulatory Visit (HOSPITAL_BASED_OUTPATIENT_CLINIC_OR_DEPARTMENT_OTHER): Payer: Medicare HMO | Admitting: Family Medicine

## 2020-11-20 DIAGNOSIS — M5442 Lumbago with sciatica, left side: Secondary | ICD-10-CM | POA: Diagnosis not present

## 2020-11-20 DIAGNOSIS — M47816 Spondylosis without myelopathy or radiculopathy, lumbar region: Secondary | ICD-10-CM | POA: Diagnosis not present

## 2020-11-20 DIAGNOSIS — M5136 Other intervertebral disc degeneration, lumbar region: Secondary | ICD-10-CM | POA: Diagnosis not present

## 2020-11-20 DIAGNOSIS — M9903 Segmental and somatic dysfunction of lumbar region: Secondary | ICD-10-CM | POA: Diagnosis not present

## 2020-11-21 DIAGNOSIS — M25611 Stiffness of right shoulder, not elsewhere classified: Secondary | ICD-10-CM | POA: Diagnosis not present

## 2020-11-21 DIAGNOSIS — M25511 Pain in right shoulder: Secondary | ICD-10-CM | POA: Diagnosis not present

## 2020-11-21 DIAGNOSIS — M7581 Other shoulder lesions, right shoulder: Secondary | ICD-10-CM | POA: Diagnosis not present

## 2020-11-22 DIAGNOSIS — M5136 Other intervertebral disc degeneration, lumbar region: Secondary | ICD-10-CM | POA: Diagnosis not present

## 2020-11-22 DIAGNOSIS — M9903 Segmental and somatic dysfunction of lumbar region: Secondary | ICD-10-CM | POA: Diagnosis not present

## 2020-11-22 DIAGNOSIS — M5442 Lumbago with sciatica, left side: Secondary | ICD-10-CM | POA: Diagnosis not present

## 2020-11-22 DIAGNOSIS — M47816 Spondylosis without myelopathy or radiculopathy, lumbar region: Secondary | ICD-10-CM | POA: Diagnosis not present

## 2020-11-27 DIAGNOSIS — M5136 Other intervertebral disc degeneration, lumbar region: Secondary | ICD-10-CM | POA: Diagnosis not present

## 2020-11-27 DIAGNOSIS — M9903 Segmental and somatic dysfunction of lumbar region: Secondary | ICD-10-CM | POA: Diagnosis not present

## 2020-11-27 DIAGNOSIS — M25511 Pain in right shoulder: Secondary | ICD-10-CM | POA: Diagnosis not present

## 2020-11-27 DIAGNOSIS — M5442 Lumbago with sciatica, left side: Secondary | ICD-10-CM | POA: Diagnosis not present

## 2020-11-27 DIAGNOSIS — M25611 Stiffness of right shoulder, not elsewhere classified: Secondary | ICD-10-CM | POA: Diagnosis not present

## 2020-11-27 DIAGNOSIS — M7581 Other shoulder lesions, right shoulder: Secondary | ICD-10-CM | POA: Diagnosis not present

## 2020-11-27 DIAGNOSIS — M47816 Spondylosis without myelopathy or radiculopathy, lumbar region: Secondary | ICD-10-CM | POA: Diagnosis not present

## 2020-12-04 DIAGNOSIS — M7581 Other shoulder lesions, right shoulder: Secondary | ICD-10-CM | POA: Diagnosis not present

## 2020-12-04 DIAGNOSIS — M25611 Stiffness of right shoulder, not elsewhere classified: Secondary | ICD-10-CM | POA: Diagnosis not present

## 2020-12-04 DIAGNOSIS — M25511 Pain in right shoulder: Secondary | ICD-10-CM | POA: Diagnosis not present

## 2020-12-06 DIAGNOSIS — M5442 Lumbago with sciatica, left side: Secondary | ICD-10-CM | POA: Diagnosis not present

## 2020-12-06 DIAGNOSIS — M5136 Other intervertebral disc degeneration, lumbar region: Secondary | ICD-10-CM | POA: Diagnosis not present

## 2020-12-06 DIAGNOSIS — M9903 Segmental and somatic dysfunction of lumbar region: Secondary | ICD-10-CM | POA: Diagnosis not present

## 2020-12-06 DIAGNOSIS — M47816 Spondylosis without myelopathy or radiculopathy, lumbar region: Secondary | ICD-10-CM | POA: Diagnosis not present

## 2020-12-10 DIAGNOSIS — M7581 Other shoulder lesions, right shoulder: Secondary | ICD-10-CM | POA: Diagnosis not present

## 2020-12-10 DIAGNOSIS — M25611 Stiffness of right shoulder, not elsewhere classified: Secondary | ICD-10-CM | POA: Diagnosis not present

## 2020-12-10 DIAGNOSIS — M25511 Pain in right shoulder: Secondary | ICD-10-CM | POA: Diagnosis not present

## 2020-12-13 DIAGNOSIS — M5442 Lumbago with sciatica, left side: Secondary | ICD-10-CM | POA: Diagnosis not present

## 2020-12-13 DIAGNOSIS — M47816 Spondylosis without myelopathy or radiculopathy, lumbar region: Secondary | ICD-10-CM | POA: Diagnosis not present

## 2020-12-13 DIAGNOSIS — M5136 Other intervertebral disc degeneration, lumbar region: Secondary | ICD-10-CM | POA: Diagnosis not present

## 2020-12-13 DIAGNOSIS — M9903 Segmental and somatic dysfunction of lumbar region: Secondary | ICD-10-CM | POA: Diagnosis not present

## 2020-12-19 DIAGNOSIS — M25661 Stiffness of right knee, not elsewhere classified: Secondary | ICD-10-CM | POA: Diagnosis not present

## 2020-12-19 DIAGNOSIS — M25561 Pain in right knee: Secondary | ICD-10-CM | POA: Diagnosis not present

## 2020-12-19 DIAGNOSIS — M1711 Unilateral primary osteoarthritis, right knee: Secondary | ICD-10-CM | POA: Diagnosis not present

## 2020-12-20 DIAGNOSIS — M5442 Lumbago with sciatica, left side: Secondary | ICD-10-CM | POA: Diagnosis not present

## 2020-12-20 DIAGNOSIS — M5136 Other intervertebral disc degeneration, lumbar region: Secondary | ICD-10-CM | POA: Diagnosis not present

## 2020-12-20 DIAGNOSIS — M47816 Spondylosis without myelopathy or radiculopathy, lumbar region: Secondary | ICD-10-CM | POA: Diagnosis not present

## 2020-12-20 DIAGNOSIS — M9903 Segmental and somatic dysfunction of lumbar region: Secondary | ICD-10-CM | POA: Diagnosis not present

## 2020-12-24 ENCOUNTER — Other Ambulatory Visit: Payer: Self-pay

## 2020-12-24 ENCOUNTER — Encounter (HOSPITAL_BASED_OUTPATIENT_CLINIC_OR_DEPARTMENT_OTHER): Payer: Self-pay | Admitting: Family Medicine

## 2020-12-24 ENCOUNTER — Ambulatory Visit (INDEPENDENT_AMBULATORY_CARE_PROVIDER_SITE_OTHER): Payer: Medicare HMO | Admitting: Family Medicine

## 2020-12-24 VITALS — BP 122/82 | HR 87 | Ht 64.0 in | Wt 141.5 lb

## 2020-12-24 DIAGNOSIS — Z01818 Encounter for other preprocedural examination: Secondary | ICD-10-CM | POA: Insufficient documentation

## 2020-12-24 LAB — POCT URINALYSIS DIPSTICK
Bilirubin, UA: NEGATIVE
Blood, UA: POSITIVE
Glucose, UA: NEGATIVE
Ketones, UA: NEGATIVE
Leukocytes, UA: NEGATIVE
Nitrite, UA: NEGATIVE
Protein, UA: NEGATIVE
Spec Grav, UA: 1.015 (ref 1.010–1.025)
Urobilinogen, UA: 0.2 E.U./dL
pH, UA: 6.5 (ref 5.0–8.0)

## 2020-12-24 NOTE — Patient Instructions (Signed)
°  Medication Instructions:  Your physician recommends that you continue on your current medications as directed. Please refer to the Current Medication list given to you today. --If you need a refill on any your medications before your next appointment, please call your pharmacy first. If no refills are authorized on file call the office.-- Lab Work: Your physician has recommended that you have lab work today: Urinalysis If you have labs (blood work) drawn today and your tests are completely normal, you will receive your results via [place] a phone call from our staff.  Please ensure you check your voicemail in the event that you authorized detailed messages to be left on a delegated number. If you have any lab test that is abnormal or we need to change your treatment, we will call you to review the results.  Follow-Up: Your next appointment:   Your physician recommends that you schedule a follow-up appointment in: 6 MONTHS with Dr. de [place]am  You will receive a text message or e-mail with a link to a survey about your care and experience with [place] today! We would greatly appreciate your feedback!   Thanks for letting us be apart of your health journey!!  Primary Care and Sports Medicine   Dr. Arlina Robes [place]   We encourage you to activate your patient portal called "MyChart".  Sign up information is provided on this After Visit Summary.  MyChart is used to connect with patients for Virtual Visits (Telemedicine).  Patients are able to view lab/test results, encounter notes, upcoming appointments, etc.  Non-urgent messages can be sent to your provider as well. To learn more about what you can do with MyChart, please visit --  [email or web address].

## 2020-12-24 NOTE — Progress Notes (Signed)
° ° °  Procedures performed today:    None.  Independent interpretation of notes and tests performed by another provider:   None.  Brief History, Exam, Impression, and Recommendations:    Tara Fernandez is a 70 y.o. presenting for preoperative evaluation/clearance. The planned procedure is Right Total Knee Arthroplasty with the treatment goal of pain relief and improvement in function. Cardiac risk for planned procedure is Intermediate (1 to 5%) - intraperitoneal or intrathoracic surgery, carotid endarterectomy, head and neck surgery, orthopedic surgery, prostate surgery  Signs or symptoms of cardiovascular disease? No New or unstable cardiopulmonary signs or symptoms? No Urinary symptoms or undergoing surgical implantation of foreign material (prosthetic joint, heart valve) or invasive urologic procedure? Yes - will be having prosthetic joint implanted  BP 122/82    Pulse 87    Ht 5\' 4"  (1.626 m)    Wt 141 lb 8 oz (64.2 kg)    LMP 01/07/1996 (Approximate)    SpO2 100%    BMI 24.29 kg/m   Exam: 70 year old female in no acute distress Cardiovascular exam with regular rate and rhythm, no murmurs appreciated Lungs clear to auscultation bilaterally  Preoperative clearance Based on history and exam will order the following preoperative tests: Urinalysis, CBC, CMP - CBC and CMP are scheduled through surgeon's office. Urinalysis completed in office today with no signs of infection.  Patient is medically cleared to proceed with planned surgery. Completed and faxed form from surgeon's office.   ___________________________________________ Symir Mah de [place], MD, ABFM, [facility] and [place]

## 2020-12-24 NOTE — Assessment & Plan Note (Signed)
Based on history and exam will order the following preoperative tests: Urinalysis, CBC, CMP - CBC and CMP are scheduled through surgeon's office. Urinalysis completed in office today with no signs of infection.

## 2020-12-26 DIAGNOSIS — M25511 Pain in right shoulder: Secondary | ICD-10-CM | POA: Diagnosis not present

## 2020-12-26 DIAGNOSIS — M25611 Stiffness of right shoulder, not elsewhere classified: Secondary | ICD-10-CM | POA: Diagnosis not present

## 2020-12-26 DIAGNOSIS — M7581 Other shoulder lesions, right shoulder: Secondary | ICD-10-CM | POA: Diagnosis not present

## 2020-12-27 DIAGNOSIS — M5442 Lumbago with sciatica, left side: Secondary | ICD-10-CM | POA: Diagnosis not present

## 2020-12-27 DIAGNOSIS — M47816 Spondylosis without myelopathy or radiculopathy, lumbar region: Secondary | ICD-10-CM | POA: Diagnosis not present

## 2020-12-27 DIAGNOSIS — M5136 Other intervertebral disc degeneration, lumbar region: Secondary | ICD-10-CM | POA: Diagnosis not present

## 2020-12-27 DIAGNOSIS — M9903 Segmental and somatic dysfunction of lumbar region: Secondary | ICD-10-CM | POA: Diagnosis not present

## 2021-01-03 DIAGNOSIS — M5442 Lumbago with sciatica, left side: Secondary | ICD-10-CM | POA: Diagnosis not present

## 2021-01-03 DIAGNOSIS — M25511 Pain in right shoulder: Secondary | ICD-10-CM | POA: Diagnosis not present

## 2021-01-03 DIAGNOSIS — M5136 Other intervertebral disc degeneration, lumbar region: Secondary | ICD-10-CM | POA: Diagnosis not present

## 2021-01-03 DIAGNOSIS — M47816 Spondylosis without myelopathy or radiculopathy, lumbar region: Secondary | ICD-10-CM | POA: Diagnosis not present

## 2021-01-03 DIAGNOSIS — M7581 Other shoulder lesions, right shoulder: Secondary | ICD-10-CM | POA: Diagnosis not present

## 2021-01-03 DIAGNOSIS — M25611 Stiffness of right shoulder, not elsewhere classified: Secondary | ICD-10-CM | POA: Diagnosis not present

## 2021-01-03 DIAGNOSIS — M9903 Segmental and somatic dysfunction of lumbar region: Secondary | ICD-10-CM | POA: Diagnosis not present

## 2021-01-08 DIAGNOSIS — M5136 Other intervertebral disc degeneration, lumbar region: Secondary | ICD-10-CM | POA: Diagnosis not present

## 2021-01-08 DIAGNOSIS — M5442 Lumbago with sciatica, left side: Secondary | ICD-10-CM | POA: Diagnosis not present

## 2021-01-08 DIAGNOSIS — M9903 Segmental and somatic dysfunction of lumbar region: Secondary | ICD-10-CM | POA: Diagnosis not present

## 2021-01-08 DIAGNOSIS — M47816 Spondylosis without myelopathy or radiculopathy, lumbar region: Secondary | ICD-10-CM | POA: Diagnosis not present

## 2021-01-09 NOTE — Patient Instructions (Signed)
DUE TO COVID-19 ONLY ONE VISITOR IS ALLOWED TO COME WITH YOU AND STAY IN THE WAITING ROOM ONLY DURING PRE OP AND PROCEDURE DAY OF SURGERY IF YOU ARE GOING HOME AFTER SURGERY. IF YOU ARE SPENDING THE NIGHT 2 PEOPLE MAY VISIT WITH YOU IN YOUR PRIVATE ROOM AFTER SURGERY UNTIL VISITING  HOURS ARE OVER AT 800 PM AND 1  VISITOR  MAY  SPEND THE NIGHT.   YOU NEED TO HAVE A COVID 19 TEST ON___1/13/23____ @__9 :30_____, THIS TEST MUST BE DONE BEFORE SURGERY,  Come in through the main entrance of [facility]. Sit in the chairs to the right of the door and call [phone]. The nurse will come and get you. You do not have to stop at admitting.                Essance Gatti     Your procedure is scheduled on: 01/22/21   Report to [facility] Main  Entrance   Report to short stay at 5:15  AM     Call this number if you have problems the morning of surgery [phone]    No food after midnight.    You may have clear liquid until 4:30 AM.    At 4:00 AM drink pre surgery drink.   Nothing by mouth after 4:30 AM.   CLEAR LIQUID DIET   Foods Allowed                                                                     Foods Excluded  Coffee and tea, regular and decaf                             liquids that you cannot  Plain Jell-O any favor except red or purple                                           see through such as: Fruit ices (not with fruit pulp)                                     milk, soups, orange juice  Iced Popsicles                                    All solid food Carbonated beverages, regular and diet                                    Cranberry, grape and apple juices Sports drinks like Gatorade Lightly seasoned clear broth or consume(fat free) Sugar     BRUSH YOUR TEETH MORNING OF SURGERY AND RINSE YOUR MOUTH OUT, NO CHEWING GUM CANDY OR MINTS.     Take these medicines the morning of surgery with A SIP OF WATER: none  You may  not have any metal on your body including hair pins and              piercings  Do not wear jewelry, make-up, lotions, powders or perfumes, deodorant             Do not wear nail polish on your fingernails.  Do not shave  48 hours prior to surgery.              .   Do not bring valuables to the hospital. [place].  Contacts, dentures or bridgework may not be worn into surgery.  Leave suitcase in the car. After surgery it may be brought to your room.                [facility] - Preparing for Surgery Before surgery, you can play an important role.  Because skin is not sterile, your skin needs to be as free of germs as possible.  You can reduce the number of germs on your skin by washing with CHG (chlorahexidine gluconate) soap before surgery.  CHG is an antiseptic cleaner which kills germs and bonds with the skin to continue killing germs even after washing. Please DO NOT use if you have an allergy to CHG or antibacterial soaps.  If your skin becomes reddened/irritated stop using the CHG and inform your nurse when you arrive at Short Stay. Do not shave (including legs and underarms) for at least 48 hours prior to the first CHG shower.  Please follow these instructions carefully:  1.  Shower with CHG Soap the night before surgery and the  morning of Surgery.  2.  If you choose to wash your hair, wash your hair first as usual with your  normal  shampoo.  3.  After you shampoo, rinse your hair and body thoroughly to remove the  shampoo.                            4.  Use CHG as you would any other liquid soap.  You can apply chg directly  to the skin and wash                       Gently with a scrungie or clean washcloth.  5.  Apply the CHG Soap to your body ONLY FROM THE NECK DOWN.   Do not use on face/ open                           Wound or open sores. Avoid contact with eyes, ears mouth and genitals (private parts).                        Wash face,  Genitals (private parts) with your normal soap.             6.  Wash thoroughly, paying special attention to the area where your surgery  will be performed.  7.  Thoroughly rinse your body with warm water from the neck down.  8.  DO NOT shower/wash with your normal soap after using and rinsing off  the CHG Soap.                9.  Pat yourself dry with a clean  towel.            10.  Wear clean pajamas.            11.  Place clean sheets on your bed the night of your first shower and do not  sleep with pets. Day of Surgery : Do not apply any lotions/deodorants the morning of surgery.  Please wear clean clothes to the hospital/surgery center.  FAILURE TO FOLLOW THESE INSTRUCTIONS MAY RESULT IN THE CANCELLATION OF YOUR SURGERY PATIENT SIGNATURE_________________________________  NURSE SIGNATURE__________________________________  ________________________________________________________________________   Adam Phenix  An incentive spirometer is a tool that can help keep your lungs clear and active. This tool measures how well you are filling your lungs with each breath. Taking long deep breaths may help reverse or decrease the chance of developing breathing (pulmonary) problems (especially infection) following: A long period of time when you are unable to move or be active. BEFORE THE PROCEDURE  If the spirometer includes an indicator to show your best effort, your nurse or respiratory therapist will set it to a desired goal. If possible, sit up straight or lean slightly forward. Try not to slouch. Hold the incentive spirometer in an upright position. INSTRUCTIONS FOR USE  Sit on the edge of your bed if possible, or sit up as far as you can in bed or on a chair. Hold the incentive spirometer in an upright position. Breathe out normally. Place the mouthpiece in your mouth and seal your lips tightly around it. Breathe in slowly and as deeply as possible, raising the piston  or the ball toward the top of the column. Hold your breath for 3-5 seconds or for as long as possible. Allow the piston or ball to fall to the bottom of the column. Remove the mouthpiece from your mouth and breathe out normally. Rest for a few seconds and repeat Steps 1 through 7 at least 10 times every 1-2 hours when you are awake. Take your time and take a few normal breaths between deep breaths. The spirometer may include an indicator to show your best effort. Use the indicator as a goal to work toward during each repetition. After each set of 10 deep breaths, practice coughing to be sure your lungs are clear. If you have an incision (the cut made at the time of surgery), support your incision when coughing by placing a pillow or rolled up towels firmly against it. Once you are able to get out of bed, walk around indoors and cough well. You may stop using the incentive spirometer when instructed by your caregiver.  RISKS AND COMPLICATIONS Take your time so you do not get dizzy or light-headed. If you are in pain, you may need to take or ask for pain medication before doing incentive spirometry. It is harder to take a deep breath if you are having pain. AFTER USE Rest and breathe slowly and easily. It can be helpful to keep track of a log of your progress. Your caregiver can provide you with a simple table to help with this. If you are using the spirometer at home, follow these instructions: [place] IF:  You are having difficultly using the spirometer. You have trouble using the spirometer as often as instructed. Your pain medication is not giving enough relief while using the spirometer. You develop fever of 100.5 F (38.1 C) or higher. SEEK IMMEDIATE MEDICAL CARE IF:  You cough up bloody sputum that had not been present before. You develop fever of 102  F (38.9 C) or greater. You develop worsening pain at or near the incision site. MAKE SURE YOU:  Understand these  instructions. Will watch your condition. Will get help right away if you are not doing well or get worse. Document Released: 05/05/2006 Document Revised: 03/17/2011 Document Reviewed: 07/06/2006 [facility] Patient Information 2014 [place], [place].   ________________________________________________________________________

## 2021-01-10 ENCOUNTER — Other Ambulatory Visit: Payer: Self-pay

## 2021-01-10 ENCOUNTER — Encounter (HOSPITAL_COMMUNITY)
Admission: RE | Admit: 2021-01-10 | Discharge: 2021-01-10 | Disposition: A | Discharge status: Home / Self Care | Payer: Medicare HMO | Source: Ambulatory Visit | Attending: Orthopedic Surgery | Admitting: Orthopedic Surgery

## 2021-01-10 ENCOUNTER — Encounter (HOSPITAL_COMMUNITY): Payer: Self-pay

## 2021-01-10 VITALS — BP 115/81 | HR 83 | Temp 98.0°F | Resp 18 | Ht 63.0 in | Wt 140.3 lb

## 2021-01-10 DIAGNOSIS — Z01812 Encounter for preprocedural laboratory examination: Secondary | ICD-10-CM | POA: Insufficient documentation

## 2021-01-10 DIAGNOSIS — M1711 Unilateral primary osteoarthritis, right knee: Secondary | ICD-10-CM

## 2021-01-10 DIAGNOSIS — Z01818 Encounter for other preprocedural examination: Secondary | ICD-10-CM

## 2021-01-10 DIAGNOSIS — Z20822 Contact with and (suspected) exposure to covid-19: Secondary | ICD-10-CM | POA: Diagnosis not present

## 2021-01-10 LAB — COMPREHENSIVE METABOLIC PANEL
ALT: 23 U/L (ref 0–44)
AST: 20 U/L (ref 15–41)
Albumin: 4.1 g/dL (ref 3.5–5.0)
Alkaline Phosphatase: 51 U/L (ref 38–126)
Anion gap: 7 (ref 5–15)
BUN: 19 mg/dL (ref 8–23)
CO2: 22 mmol/L (ref 22–32)
Calcium: 8.7 mg/dL — ABNORMAL LOW (ref 8.9–10.3)
Chloride: 108 mmol/L (ref 98–111)
Creatinine, Ser: 0.76 mg/dL (ref 0.44–1.00)
GFR, Estimated: >60 mL/min (ref >60)
Glucose, Bld: 101 mg/dL — ABNORMAL HIGH (ref 70–99)
Potassium: 3.8 mmol/L (ref 3.5–5.1)
Sodium: 137 mmol/L (ref 135–145)
Total Bilirubin: 0.9 mg/dL (ref 0.3–1.2)
Total Protein: 6.4 g/dL — ABNORMAL LOW (ref 6.5–8.1)

## 2021-01-10 LAB — CBC
HCT: 42 % (ref 36.0–46.0)
Hemoglobin: 14.2 g/dL (ref 12.0–15.0)
MCH: 31.4 pg (ref 26.0–34.0)
MCHC: 33.8 g/dL (ref 30.0–36.0)
MCV: 92.9 fL (ref 80.0–100.0)
Platelets: 227 10*3/uL (ref 150–400)
RBC: 4.52 MIL/uL (ref 3.87–5.11)
RDW: 13.1 % (ref 11.5–15.5)
WBC: 7.1 10*3/uL (ref 4.0–10.5)
nRBC: 0 % (ref 0.0–0.2)

## 2021-01-10 LAB — SURGICAL PCR SCREEN
MRSA, PCR: NEGATIVE
Staphylococcus aureus: NEGATIVE

## 2021-01-10 LAB — TYPE AND SCREEN
ABO/RH(D): B POS
Antibody Screen: NEGATIVE

## 2021-01-10 NOTE — Progress Notes (Signed)
COVID test- 01/18/21 at 9:30 am   PCP - Dr. Sydell Axon Cardiologist - none  Chest x-ray - no EKG - no Stress Test - no ECHO - no Cardiac Cath - no Pacemaker/ICD device last checked:NA  Sleep Study - no CPAP -   Fasting Blood Sugar - NA Checks Blood Sugar _____ times a day  Blood Thinner Instructions:NA Aspirin Instructions: Last Dose:  Anesthesia review: no  Patient denies shortness of breath, fever, cough and chest pain at PAT appointment Pt has no SOB with activities. She takes natural supplements and was reminded to stop them 1 week before surgery. She asked about nail polish and I told her no polish or product on her fingers or toes.  Patient verbalized understanding of instructions that were given to them at the PAT appointment. Patient was also instructed that they will need to review over the PAT instructions again at home before surgery. yes

## 2021-01-16 DIAGNOSIS — M7581 Other shoulder lesions, right shoulder: Secondary | ICD-10-CM | POA: Diagnosis not present

## 2021-01-16 DIAGNOSIS — M25611 Stiffness of right shoulder, not elsewhere classified: Secondary | ICD-10-CM | POA: Diagnosis not present

## 2021-01-16 DIAGNOSIS — M25511 Pain in right shoulder: Secondary | ICD-10-CM | POA: Diagnosis not present

## 2021-01-17 DIAGNOSIS — M5442 Lumbago with sciatica, left side: Secondary | ICD-10-CM | POA: Diagnosis not present

## 2021-01-17 DIAGNOSIS — M9903 Segmental and somatic dysfunction of lumbar region: Secondary | ICD-10-CM | POA: Diagnosis not present

## 2021-01-17 DIAGNOSIS — M5136 Other intervertebral disc degeneration, lumbar region: Secondary | ICD-10-CM | POA: Diagnosis not present

## 2021-01-17 DIAGNOSIS — M47816 Spondylosis without myelopathy or radiculopathy, lumbar region: Secondary | ICD-10-CM | POA: Diagnosis not present

## 2021-01-18 ENCOUNTER — Encounter (HOSPITAL_COMMUNITY)
Admission: RE | Admit: 2021-01-18 | Discharge: 2021-01-18 | Disposition: A | Discharge status: Home / Self Care | Payer: Medicare HMO | Source: Ambulatory Visit | Attending: Orthopedic Surgery | Admitting: Orthopedic Surgery

## 2021-01-18 ENCOUNTER — Other Ambulatory Visit: Payer: Self-pay

## 2021-01-18 DIAGNOSIS — U071 COVID-19: Secondary | ICD-10-CM | POA: Insufficient documentation

## 2021-01-18 DIAGNOSIS — Z01812 Encounter for preprocedural laboratory examination: Secondary | ICD-10-CM | POA: Insufficient documentation

## 2021-01-18 DIAGNOSIS — Z01818 Encounter for other preprocedural examination: Secondary | ICD-10-CM

## 2021-01-18 LAB — SARS CORONAVIRUS 2 (TAT 6-24 HRS): SARS Coronavirus 2: POSITIVE — AB

## 2021-01-22 DIAGNOSIS — M1711 Unilateral primary osteoarthritis, right knee: Secondary | ICD-10-CM

## 2021-01-29 DIAGNOSIS — M5442 Lumbago with sciatica, left side: Secondary | ICD-10-CM | POA: Diagnosis not present

## 2021-01-29 DIAGNOSIS — M5136 Other intervertebral disc degeneration, lumbar region: Secondary | ICD-10-CM | POA: Diagnosis not present

## 2021-01-29 DIAGNOSIS — M9903 Segmental and somatic dysfunction of lumbar region: Secondary | ICD-10-CM | POA: Diagnosis not present

## 2021-01-29 DIAGNOSIS — M47816 Spondylosis without myelopathy or radiculopathy, lumbar region: Secondary | ICD-10-CM | POA: Diagnosis not present

## 2021-01-31 NOTE — Progress Notes (Signed)
PT COVID + 01/18/21. No retest for surgery needed.

## 2021-02-04 DIAGNOSIS — M25611 Stiffness of right shoulder, not elsewhere classified: Secondary | ICD-10-CM | POA: Diagnosis not present

## 2021-02-04 DIAGNOSIS — M7581 Other shoulder lesions, right shoulder: Secondary | ICD-10-CM | POA: Diagnosis not present

## 2021-02-04 DIAGNOSIS — M25511 Pain in right shoulder: Secondary | ICD-10-CM | POA: Diagnosis not present

## 2021-02-04 NOTE — Patient Instructions (Addendum)
DUE TO COVID-19 ONLY ONE VISITOR IS ALLOWED TO COME WITH YOU AND STAY IN THE WAITING ROOM ONLY DURING PRE OP AND PROCEDURE.   **NO VISITORS ARE ALLOWED IN THE SHORT STAY AREA OR RECOVERY ROOM!!**  IF YOU WILL BE ADMITTED INTO THE HOSPITAL YOU ARE ALLOWED ONLY TWO SUPPORT PEOPLE DURING VISITATION HOURS ONLY (7 AM -8PM)   The support person(s) must pass our screening, gel in and out, and wear a mask at all times, including in the patients room. Patients must also wear a mask when staff or their support person are in the room. Visitors GUEST BADGE MUST BE WORN VISIBLY  One adult visitor may remain with you overnight and MUST be in the room by 8 P.M.  No visitors under the age of 89. Any visitor under the age of 8 must be accompanied by an adult.        Your procedure is scheduled on: 02/18/21   Report to [facility] Main Entrance    Report to admitting at 10:15 AM   Call this number if you have problems the morning of surgery [phone]   Do not eat food :After Midnight.   May have liquids until 10:00 AM day of surgery  CLEAR LIQUID DIET  Foods Allowed                                                                     Foods Excluded  Water, Black Coffee and tea (no milk or creamer)           liquids that you cannot  Plain Jell-O in any flavor  (No red)                                    see through such as: Fruit ices (not with fruit pulp)                                            milk, soups, orange juice              Iced Popsicles (No red)                                               All solid food                                   Apple juices Sports drinks like Gatorade (No red) Lightly seasoned clear broth or consume(fat free) Sugar    The day of surgery:  Drink ONE (1) Pre-Surgery Clear Ensure at 10:00 AM the morning of surgery. Drink in one sitting. Do not sip.  This drink was given to you during your hospital  pre-op appointment visit. Nothing else to  drink after completing the  Pre-Surgery Clear Ensure.          If you have questions, please contact your  surgeons office.   Oral Hygiene is also important to reduce your risk of infection.                                    Remember - BRUSH YOUR TEETH THE MORNING OF SURGERY WITH YOUR REGULAR TOOTHPASTE   Stop all vitamins and supplements 7 days before surgery.   Take these medicines the morning of surgery with A SIP OF WATER: None                              You may not have any metal on your body including hair pins, jewelry, and body piercing             Do not wear make-up, lotions, powders, perfumes, or deodorant  Do not wear nail polish including gel and S&S, artificial/acrylic nails, or any other type of covering on natural nails including finger and toenails. If you have artificial nails, gel coating, etc. that needs to be removed by a nail salon please have this removed prior to surgery or surgery may need to be canceled/ delayed if the surgeon/ anesthesia feels like they are unable to be safely monitored.   Do not shave  48 hours prior to surgery.    Do not bring valuables to the hospital. [place].   Bring small overnight bag day of surgery.   Special Instructions: Bring a copy of your healthcare power of attorney and living will documents         the day of surgery if you haven't scanned them before.              Please read over the following fact sheets you were given: IF YOU HAVE QUESTIONS ABOUT YOUR PRE-OP INSTRUCTIONS PLEASE CALL [facility] - Preparing for Surgery Before surgery, you can play an important role.  Because skin is not sterile, your skin needs to be as free of germs as possible.  You can reduce the number of germs on your skin by washing with CHG (chlorahexidine gluconate) soap before surgery.  CHG is an antiseptic cleaner which kills germs and bonds with the skin to continue  killing germs even after washing. Please DO NOT use if you have an allergy to CHG or antibacterial soaps.  If your skin becomes reddened/irritated stop using the CHG and inform your nurse when you arrive at Short Stay. Do not shave (including legs and underarms) for at least 48 hours prior to the first CHG shower.  You may shave your face/neck.  Please follow these instructions carefully:  1.  Shower with CHG Soap the night before surgery and the  morning of surgery.  2.  If you choose to wash your hair, wash your hair first as usual with your normal  shampoo.  3.  After you shampoo, rinse your hair and body thoroughly to remove the shampoo.                             4.  Use CHG as you would any other liquid soap.  You can apply chg directly to the skin and wash.  Gently with a scrungie or clean washcloth.  5.  Apply the CHG Soap to your body ONLY FROM THE NECK DOWN.   Do   not use on face/ open                           Wound or open sores. Avoid contact with eyes, ears mouth and   genitals (private parts).                       Wash face,  Genitals (private parts) with your normal soap.             6.  Wash thoroughly, paying special attention to the area where your    surgery  will be performed.  7.  Thoroughly rinse your body with warm water from the neck down.  8.  DO NOT shower/wash with your normal soap after using and rinsing off the CHG Soap.                9.  Pat yourself dry with a clean towel.            10.  Wear clean pajamas.            11.  Place clean sheets on your bed the night of your first shower and do not  sleep with pets. Day of Surgery : Do not apply any lotions/deodorants the morning of surgery.  Please wear clean clothes to the hospital/surgery center.  FAILURE TO FOLLOW THESE INSTRUCTIONS MAY RESULT IN THE CANCELLATION OF YOUR SURGERY  PATIENT SIGNATURE_________________________________  NURSE  SIGNATURE__________________________________  ________________________________________________________________________  WHAT IS A BLOOD TRANSFUSION? Blood Transfusion Information  A transfusion is the replacement of blood or some of its parts. Blood is made up of multiple cells which provide different functions. Red blood cells carry oxygen and are used for blood loss replacement. White blood cells fight against infection. Platelets control bleeding. Plasma helps clot blood. Other blood products are available for specialized needs, such as hemophilia or other clotting disorders. BEFORE THE TRANSFUSION  Who gives blood for transfusions?  Healthy volunteers who are fully evaluated to make sure their blood is safe. This is blood bank blood. Transfusion therapy is the safest it has ever been in the practice of medicine. Before blood is taken from a donor, a complete history is taken to make sure that person has no history of diseases nor engages in risky social behavior (examples are intravenous drug use or sexual activity with multiple partners). The donor's travel history is screened to minimize risk of transmitting infections, such as malaria. The donated blood is tested for signs of infectious diseases, such as HIV and hepatitis. The blood is then tested to be sure it is compatible with you in order to minimize the chance of a transfusion reaction. If you or a relative donates blood, this is often done in anticipation of surgery and is not appropriate for emergency situations. It takes many days to process the donated blood. RISKS AND COMPLICATIONS Although transfusion therapy is very safe and saves many lives, the main dangers of transfusion include:  Getting an infectious disease. Developing a transfusion reaction. This is an allergic reaction to something in the blood you were given. Every precaution is taken to prevent this. The decision to have a blood transfusion has been considered carefully  by your caregiver before blood is given. Blood is not given unless the benefits outweigh the risks. AFTER THE TRANSFUSION Right after receiving  a blood transfusion, you will usually feel much better and more energetic. This is especially true if your red blood cells have gotten low (anemic). The transfusion raises the level of the red blood cells which carry oxygen, and this usually causes an energy increase. The nurse administering the transfusion will monitor you carefully for complications. HOME CARE INSTRUCTIONS  No special instructions are needed after a transfusion. You may find your energy is better. Speak with your caregiver about any limitations on activity for underlying diseases you may have. SEEK MEDICAL CARE IF:  Your condition is not improving after your transfusion. You develop redness or irritation at the intravenous (IV) site. SEEK IMMEDIATE MEDICAL CARE IF:  Any of the following symptoms occur over the next 12 hours: Shaking chills. You have a temperature by mouth above 102 F (38.9 C), not controlled by medicine. Chest, back, or muscle pain. People around you feel you are not acting correctly or are confused. Shortness of breath or difficulty breathing. Dizziness and fainting. You get a rash or develop hives. You have a decrease in urine output. Your urine turns a dark color or changes to pink, red, or brown. Any of the following symptoms occur over the next 10 days: You have a temperature by mouth above 102 F (38.9 C), not controlled by medicine. Shortness of breath. Weakness after normal activity. The white part of the eye turns yellow (jaundice). You have a decrease in the amount of urine or are urinating less often. Your urine turns a dark color or changes to pink, red, or brown. Document Released: 12/21/1999 Document Revised: 03/17/2011 Document Reviewed: 08/09/2007 [facility] Patient Information 2014 [place].  _______________________________________________________________________

## 2021-02-04 NOTE — Progress Notes (Addendum)
COVID swab appointment: n/a, pt COVID positive 01/18/21 in Epic   COVID Vaccine Completed: no Date COVID Vaccine completed: Has received booster: COVID vaccine manufacturer: [employer]   Date of COVID positive in last 90 days: yes, 01/18/21 in Epic   PCP - Raymond De Guam, MD Cardiologist - n/a  Medical clearance 12/24/20 by Dr. Guam on chart  Chest x-ray - n/a EKG - n/a Stress Test - n/a ECHO - n/a Cardiac Cath - n/a Pacemaker/ICD device last checked: n/a Spinal Cord Stimulator: n/a  Sleep Study - n/a CPAP -   Fasting Blood Sugar - n/a Checks Blood Sugar _____ times a day  Blood Thinner Instructions: n/a Aspirin Instructions: Last Dose:  Activity level: Can go up a flight of stairs and perform activities of daily living without stopping and without symptoms of chest pain or shortness of breath.       Anesthesia review:   Patient denies shortness of breath, fever, cough and chest pain at PAT appointment   Patient verbalized understanding of instructions that were given to them at the PAT appointment. Patient was also instructed that they will need to review over the PAT instructions again at home before surgery.

## 2021-02-05 ENCOUNTER — Encounter (HOSPITAL_COMMUNITY): Payer: Self-pay

## 2021-02-05 ENCOUNTER — Encounter (HOSPITAL_COMMUNITY)
Admission: RE | Admit: 2021-02-05 | Discharge: 2021-02-05 | Disposition: A | Discharge status: Home / Self Care | Payer: Medicare HMO | Source: Ambulatory Visit | Attending: Orthopedic Surgery | Admitting: Orthopedic Surgery

## 2021-02-05 ENCOUNTER — Other Ambulatory Visit: Payer: Self-pay

## 2021-02-05 VITALS — BP 118/80 | HR 69 | Temp 98.4°F | Resp 18 | Ht 64.0 in | Wt 138.0 lb

## 2021-02-05 DIAGNOSIS — Z01812 Encounter for preprocedural laboratory examination: Secondary | ICD-10-CM | POA: Insufficient documentation

## 2021-02-05 DIAGNOSIS — Z01818 Encounter for other preprocedural examination: Secondary | ICD-10-CM

## 2021-02-05 LAB — CBC
HCT: 43.1 % (ref 36.0–46.0)
Hemoglobin: 14.5 g/dL (ref 12.0–15.0)
MCH: 31.2 pg (ref 26.0–34.0)
MCHC: 33.6 g/dL (ref 30.0–36.0)
MCV: 92.7 fL (ref 80.0–100.0)
Platelets: 249 10*3/uL (ref 150–400)
RBC: 4.65 MIL/uL (ref 3.87–5.11)
RDW: 13.2 % (ref 11.5–15.5)
WBC: 5.7 10*3/uL (ref 4.0–10.5)
nRBC: 0 % (ref 0.0–0.2)

## 2021-02-05 LAB — COMPREHENSIVE METABOLIC PANEL
ALT: 21 U/L (ref 0–44)
AST: 23 U/L (ref 15–41)
Albumin: 4 g/dL (ref 3.5–5.0)
Alkaline Phosphatase: 49 U/L (ref 38–126)
Anion gap: 8 (ref 5–15)
BUN: 12 mg/dL (ref 8–23)
CO2: 25 mmol/L (ref 22–32)
Calcium: 8.9 mg/dL (ref 8.9–10.3)
Chloride: 105 mmol/L (ref 98–111)
Creatinine, Ser: 0.58 mg/dL (ref 0.44–1.00)
GFR, Estimated: >60 mL/min (ref >60)
Glucose, Bld: 90 mg/dL (ref 70–99)
Potassium: 4.3 mmol/L (ref 3.5–5.1)
Sodium: 138 mmol/L (ref 135–145)
Total Bilirubin: 0.6 mg/dL (ref 0.3–1.2)
Total Protein: 6.4 g/dL — ABNORMAL LOW (ref 6.5–8.1)

## 2021-02-05 LAB — SURGICAL PCR SCREEN
MRSA, PCR: NEGATIVE
Staphylococcus aureus: NEGATIVE

## 2021-02-05 LAB — TYPE AND SCREEN
ABO/RH(D): B POS
Antibody Screen: NEGATIVE

## 2021-02-07 DIAGNOSIS — M5442 Lumbago with sciatica, left side: Secondary | ICD-10-CM | POA: Diagnosis not present

## 2021-02-07 DIAGNOSIS — M47816 Spondylosis without myelopathy or radiculopathy, lumbar region: Secondary | ICD-10-CM | POA: Diagnosis not present

## 2021-02-07 DIAGNOSIS — M9903 Segmental and somatic dysfunction of lumbar region: Secondary | ICD-10-CM | POA: Diagnosis not present

## 2021-02-07 DIAGNOSIS — M5136 Other intervertebral disc degeneration, lumbar region: Secondary | ICD-10-CM | POA: Diagnosis not present

## 2021-02-18 ENCOUNTER — Encounter (HOSPITAL_COMMUNITY)
Admission: RE | Disposition: A | Discharge status: Home / Self Care | Payer: Self-pay | Source: Ambulatory Visit | Attending: Orthopedic Surgery

## 2021-02-18 ENCOUNTER — Other Ambulatory Visit: Payer: Self-pay

## 2021-02-18 ENCOUNTER — Ambulatory Visit (HOSPITAL_BASED_OUTPATIENT_CLINIC_OR_DEPARTMENT_OTHER): Payer: Medicare HMO | Admitting: Anesthesiology

## 2021-02-18 ENCOUNTER — Ambulatory Visit (HOSPITAL_COMMUNITY): Payer: Medicare HMO | Admitting: Anesthesiology

## 2021-02-18 ENCOUNTER — Observation Stay (HOSPITAL_COMMUNITY)
Admission: RE | Admit: 2021-02-18 | Discharge: 2021-02-19 | Disposition: A | Discharge status: Home / Self Care | Payer: Medicare HMO | Source: Ambulatory Visit | Attending: Orthopedic Surgery | Admitting: Orthopedic Surgery

## 2021-02-18 ENCOUNTER — Encounter (HOSPITAL_COMMUNITY): Payer: Self-pay | Admitting: Orthopedic Surgery

## 2021-02-18 DIAGNOSIS — M1711 Unilateral primary osteoarthritis, right knee: Secondary | ICD-10-CM

## 2021-02-18 DIAGNOSIS — G8918 Other acute postprocedural pain: Secondary | ICD-10-CM | POA: Diagnosis not present

## 2021-02-18 DIAGNOSIS — M25461 Effusion, right knee: Secondary | ICD-10-CM

## 2021-02-18 DIAGNOSIS — M25761 Osteophyte, right knee: Secondary | ICD-10-CM

## 2021-02-18 DIAGNOSIS — Z96643 Presence of artificial hip joint, bilateral: Secondary | ICD-10-CM | POA: Insufficient documentation

## 2021-02-18 DIAGNOSIS — Z96651 Presence of right artificial knee joint: Secondary | ICD-10-CM

## 2021-02-18 DIAGNOSIS — Z91040 Latex allergy status: Secondary | ICD-10-CM | POA: Diagnosis not present

## 2021-02-18 DIAGNOSIS — M659 Synovitis and tenosynovitis, unspecified: Secondary | ICD-10-CM

## 2021-02-18 HISTORY — PX: TOTAL KNEE ARTHROPLASTY: SHX125

## 2021-02-18 SURGERY — ARTHROPLASTY, KNEE, TOTAL
Anesthesia: Spinal | Site: Knee | Laterality: Right

## 2021-02-18 MED ORDER — METHOCARBAMOL 500 MG IVPB - SIMPLE MED
500.0000 mg | Freq: Four times a day (QID) | INTRAVENOUS | Status: DC | PRN
  Filled 2021-02-18: qty 50

## 2021-02-18 MED ORDER — MIDAZOLAM HCL 2 MG/2ML IJ SOLN
1.0000 mg | INTRAMUSCULAR | Status: DC
  Administered 2021-02-18: 1 mg via INTRAVENOUS
  Filled 2021-02-18: qty 2

## 2021-02-18 MED ORDER — SODIUM CHLORIDE (PF) 0.9 % IJ SOLN
INTRAMUSCULAR | Status: AC
  Filled 2021-02-18: qty 10

## 2021-02-18 MED ORDER — SODIUM CHLORIDE 0.9 % IR SOLN
Status: DC | PRN
  Administered 2021-02-18: 1000 mL

## 2021-02-18 MED ORDER — DIPHENHYDRAMINE HCL 12.5 MG/5ML PO ELIX
12.5000 mg | ORAL_SOLUTION | ORAL | Status: DC | PRN
  Administered 2021-02-19: 25 mg via ORAL
  Filled 2021-02-18: qty 10

## 2021-02-18 MED ORDER — KETOROLAC TROMETHAMINE 30 MG/ML IJ SOLN
INTRAMUSCULAR | Status: AC
  Filled 2021-02-18: qty 1

## 2021-02-18 MED ORDER — KETOROLAC TROMETHAMINE 15 MG/ML IJ SOLN
7.5000 mg | Freq: Four times a day (QID) | INTRAMUSCULAR | Status: AC
  Administered 2021-02-18: 7.5 mg via INTRAVENOUS
  Filled 2021-02-18: qty 1

## 2021-02-18 MED ORDER — PHENYLEPHRINE HCL (PRESSORS) 10 MG/ML IV SOLN
INTRAVENOUS | Status: AC
  Filled 2021-02-18: qty 1

## 2021-02-18 MED ORDER — PHENYLEPHRINE HCL-NACL 20-0.9 MG/250ML-% IV SOLN
INTRAVENOUS | Status: DC | PRN
  Administered 2021-02-18: 50 ug/min via INTRAVENOUS

## 2021-02-18 MED ORDER — LACTATED RINGERS IV SOLN: INTRAVENOUS | Status: DC

## 2021-02-18 MED ORDER — BUPIVACAINE-EPINEPHRINE (PF) 0.25% -1:200000 IJ SOLN
INTRAMUSCULAR | Status: DC | PRN
  Administered 2021-02-18: 30 mL

## 2021-02-18 MED ORDER — BUPIVACAINE HCL (PF) 0.5 % IJ SOLN
INTRAMUSCULAR | Status: DC | PRN
  Administered 2021-02-18: 20 mL via PERINEURAL

## 2021-02-18 MED ORDER — METHOCARBAMOL 500 MG PO TABS
500.0000 mg | ORAL_TABLET | Freq: Four times a day (QID) | ORAL | Status: DC | PRN
  Administered 2021-02-18 – 2021-02-19 (×3): 500 mg via ORAL
  Filled 2021-02-18 (×3): qty 1

## 2021-02-18 MED ORDER — DEXAMETHASONE SODIUM PHOSPHATE 10 MG/ML IJ SOLN
INTRAMUSCULAR | Status: AC
  Filled 2021-02-18: qty 1

## 2021-02-18 MED ORDER — ORAL CARE MOUTH RINSE
15.0000 mL | Freq: Once | OROMUCOSAL | Status: AC
  Administered 2021-02-18: 15 mL via OROMUCOSAL

## 2021-02-18 MED ORDER — CEFAZOLIN SODIUM-DEXTROSE 2-4 GM/100ML-% IV SOLN
2.0000 g | INTRAVENOUS | Status: AC
  Administered 2021-02-18: 2 g via INTRAVENOUS
  Filled 2021-02-18: qty 100

## 2021-02-18 MED ORDER — ONDANSETRON HCL 4 MG PO TABS: 4.0000 mg | ORAL_TABLET | Freq: Four times a day (QID) | ORAL | Status: DC | PRN

## 2021-02-18 MED ORDER — TRANEXAMIC ACID-NACL 1000-0.7 MG/100ML-% IV SOLN
1000.0000 mg | Freq: Once | INTRAVENOUS | Status: AC
  Administered 2021-02-18: 1000 mg via INTRAVENOUS
  Filled 2021-02-18: qty 100

## 2021-02-18 MED ORDER — KETOROLAC TROMETHAMINE 30 MG/ML IJ SOLN
INTRAMUSCULAR | Status: DC | PRN
  Administered 2021-02-18: 30 mg via INTRA_ARTICULAR

## 2021-02-18 MED ORDER — PROPOFOL 10 MG/ML IV BOLUS
INTRAVENOUS | Status: AC
  Filled 2021-02-18: qty 20

## 2021-02-18 MED ORDER — HYDROMORPHONE HCL 1 MG/ML IJ SOLN
0.5000 mg | INTRAMUSCULAR | Status: DC | PRN
  Administered 2021-02-18 – 2021-02-19 (×2): 1 mg via INTRAVENOUS
  Filled 2021-02-18 (×2): qty 1

## 2021-02-18 MED ORDER — DEXAMETHASONE SODIUM PHOSPHATE 10 MG/ML IJ SOLN
INTRAMUSCULAR | Status: DC | PRN
  Administered 2021-02-18: 8 mg via INTRAVENOUS

## 2021-02-18 MED ORDER — MIDAZOLAM HCL 2 MG/2ML IJ SOLN
INTRAMUSCULAR | Status: AC
  Filled 2021-02-18: qty 2

## 2021-02-18 MED ORDER — BUPIVACAINE IN DEXTROSE 0.75-8.25 % IT SOLN
INTRATHECAL | Status: DC | PRN
Start: 2021-02-18 — End: 2021-02-18
  Administered 2021-02-18: 1.4 mL via INTRATHECAL

## 2021-02-18 MED ORDER — DOCUSATE SODIUM 100 MG PO CAPS
100.0000 mg | ORAL_CAPSULE | Freq: Two times a day (BID) | ORAL | Status: DC
  Administered 2021-02-18 – 2021-02-19 (×2): 100 mg via ORAL
  Filled 2021-02-18 (×2): qty 1

## 2021-02-18 MED ORDER — METOCLOPRAMIDE HCL 5 MG/ML IJ SOLN: 5.0000 mg | Freq: Three times a day (TID) | INTRAMUSCULAR | Status: DC | PRN

## 2021-02-18 MED ORDER — BISACODYL 10 MG RE SUPP: 10.0000 mg | Freq: Every day | RECTAL | Status: DC | PRN

## 2021-02-18 MED ORDER — HYDROMORPHONE HCL 1 MG/ML IJ SOLN: 0.2500 mg | INTRAMUSCULAR | Status: DC | PRN

## 2021-02-18 MED ORDER — CHLORHEXIDINE GLUCONATE 0.12 % MT SOLN: 15.0000 mL | Freq: Once | OROMUCOSAL | Status: AC

## 2021-02-18 MED ORDER — CEFAZOLIN SODIUM-DEXTROSE 2-4 GM/100ML-% IV SOLN
2.0000 g | Freq: Four times a day (QID) | INTRAVENOUS | Status: AC
  Administered 2021-02-18 – 2021-02-19 (×2): 2 g via INTRAVENOUS
  Filled 2021-02-18 (×2): qty 100

## 2021-02-18 MED ORDER — METHOCARBAMOL 500 MG IVPB - SIMPLE MED
INTRAVENOUS | Status: AC
  Administered 2021-02-18: 500 mg via INTRAVENOUS
  Filled 2021-02-18: qty 50

## 2021-02-18 MED ORDER — FERROUS SULFATE 325 (65 FE) MG PO TABS
325.0000 mg | ORAL_TABLET | Freq: Three times a day (TID) | ORAL | Status: DC
  Administered 2021-02-18 – 2021-02-19 (×4): 325 mg via ORAL
  Filled 2021-02-18 (×4): qty 1

## 2021-02-18 MED ORDER — MIDAZOLAM HCL 2 MG/2ML IJ SOLN
INTRAMUSCULAR | Status: DC | PRN
Start: 2021-02-18 — End: 2021-02-18
  Administered 2021-02-18 (×2): 1 mg via INTRAVENOUS

## 2021-02-18 MED ORDER — FENTANYL CITRATE PF 50 MCG/ML IJ SOSY
50.0000 ug | PREFILLED_SYRINGE | INTRAMUSCULAR | Status: DC
  Administered 2021-02-18: 50 ug via INTRAVENOUS
  Filled 2021-02-18: qty 2

## 2021-02-18 MED ORDER — MENTHOL 3 MG MT LOZG: 1.0000 | LOZENGE | OROMUCOSAL | Status: DC | PRN

## 2021-02-18 MED ORDER — PROPOFOL 500 MG/50ML IV EMUL
INTRAVENOUS | Status: AC
  Filled 2021-02-18: qty 50

## 2021-02-18 MED ORDER — SODIUM CHLORIDE (PF) 0.9 % IJ SOLN
INTRAMUSCULAR | Status: AC
  Filled 2021-02-18: qty 30

## 2021-02-18 MED ORDER — POLYETHYLENE GLYCOL 3350 17 G PO PACK: 17.0000 g | PACK | Freq: Every day | ORAL | Status: DC | PRN

## 2021-02-18 MED ORDER — PHENOL 1.4 % MT LIQD: 1.0000 | OROMUCOSAL | Status: DC | PRN

## 2021-02-18 MED ORDER — ONDANSETRON HCL 4 MG/2ML IJ SOLN: 4.0000 mg | Freq: Four times a day (QID) | INTRAMUSCULAR | Status: DC | PRN

## 2021-02-18 MED ORDER — ONDANSETRON HCL 4 MG/2ML IJ SOLN
INTRAMUSCULAR | Status: AC
  Filled 2021-02-18: qty 2

## 2021-02-18 MED ORDER — OXYCODONE HCL 5 MG/5ML PO SOLN: 5.0000 mg | Freq: Once | ORAL | Status: DC | PRN

## 2021-02-18 MED ORDER — POVIDONE-IODINE 10 % EX SWAB
2.0000 "application " | Freq: Once | CUTANEOUS | Status: AC
  Administered 2021-02-18: 2 via TOPICAL

## 2021-02-18 MED ORDER — PROPOFOL 500 MG/50ML IV EMUL
INTRAVENOUS | Status: DC | PRN
Start: 2021-02-18 — End: 2021-02-18
  Administered 2021-02-18: 35 ug/kg/min via INTRAVENOUS

## 2021-02-18 MED ORDER — TRANEXAMIC ACID-NACL 1000-0.7 MG/100ML-% IV SOLN
1000.0000 mg | INTRAVENOUS | Status: AC
  Administered 2021-02-18: 1000 mg via INTRAVENOUS
  Filled 2021-02-18: qty 100

## 2021-02-18 MED ORDER — SODIUM CHLORIDE 0.9 % IV SOLN: INTRAVENOUS | Status: DC

## 2021-02-18 MED ORDER — BUPIVACAINE-EPINEPHRINE (PF) 0.25% -1:200000 IJ SOLN
INTRAMUSCULAR | Status: AC
  Filled 2021-02-18: qty 30

## 2021-02-18 MED ORDER — DEXAMETHASONE SODIUM PHOSPHATE 10 MG/ML IJ SOLN: 8.0000 mg | Freq: Once | INTRAMUSCULAR | Status: DC

## 2021-02-18 MED ORDER — OXYCODONE HCL 5 MG PO TABS: 5.0000 mg | ORAL_TABLET | Freq: Once | ORAL | Status: DC | PRN

## 2021-02-18 MED ORDER — OXYCODONE HCL 5 MG PO TABS
10.0000 mg | ORAL_TABLET | ORAL | Status: DC | PRN
  Administered 2021-02-19: 15 mg via ORAL
  Filled 2021-02-18: qty 3

## 2021-02-18 MED ORDER — ONDANSETRON HCL 4 MG/2ML IJ SOLN
INTRAMUSCULAR | Status: DC | PRN
  Administered 2021-02-18: 4 mg via INTRAVENOUS

## 2021-02-18 MED ORDER — METOCLOPRAMIDE HCL 5 MG PO TABS: 5.0000 mg | ORAL_TABLET | Freq: Three times a day (TID) | ORAL | Status: DC | PRN

## 2021-02-18 MED ORDER — ASPIRIN 81 MG PO CHEW
81.0000 mg | CHEWABLE_TABLET | Freq: Two times a day (BID) | ORAL | Status: DC
  Administered 2021-02-18 – 2021-02-19 (×2): 81 mg via ORAL
  Filled 2021-02-18 (×2): qty 1

## 2021-02-18 MED ORDER — ACETAMINOPHEN 325 MG PO TABS
325.0000 mg | ORAL_TABLET | Freq: Four times a day (QID) | ORAL | Status: DC | PRN
  Administered 2021-02-19: 650 mg via ORAL
  Filled 2021-02-18: qty 2

## 2021-02-18 MED ORDER — SODIUM CHLORIDE (PF) 0.9 % IJ SOLN
INTRAMUSCULAR | Status: DC | PRN
  Administered 2021-02-18: 30 mL

## 2021-02-18 MED ORDER — DEXAMETHASONE SODIUM PHOSPHATE 10 MG/ML IJ SOLN
10.0000 mg | Freq: Once | INTRAMUSCULAR | Status: AC
  Administered 2021-02-19: 10 mg via INTRAVENOUS
  Filled 2021-02-18: qty 1

## 2021-02-18 MED ORDER — OXYCODONE HCL 5 MG PO TABS
5.0000 mg | ORAL_TABLET | ORAL | Status: DC | PRN
  Administered 2021-02-18: 10 mg via ORAL
  Filled 2021-02-18 (×2): qty 1

## 2021-02-18 MED ORDER — PROPOFOL 10 MG/ML IV BOLUS
INTRAVENOUS | Status: DC | PRN
Start: 2021-02-18 — End: 2021-02-18
  Administered 2021-02-18: 20 mg via INTRAVENOUS
  Administered 2021-02-18: 30 mg via INTRAVENOUS
  Administered 2021-02-18: 20 mg via INTRAVENOUS

## 2021-02-18 MED ORDER — 0.9 % SODIUM CHLORIDE (POUR BTL) OPTIME
TOPICAL | Status: DC | PRN
  Administered 2021-02-18: 1000 mL

## 2021-02-18 MED ORDER — ONDANSETRON HCL 4 MG/2ML IJ SOLN: 4.0000 mg | Freq: Once | INTRAMUSCULAR | Status: DC | PRN

## 2021-02-18 SURGICAL SUPPLY — 55 items
ATTUNE MED ANAT PAT 35 KNEE (Knees) ×1 IMPLANT
ATTUNE PSFEM RTSZ4 NARCEM KNEE (Femur) ×1 IMPLANT
ATTUNE PSRP INSR SZ4 8 KNEE (Insert) ×1 IMPLANT
BAG COUNTER SPONGE SURGICOUNT (BAG) ×1 IMPLANT
BAG ZIPLOCK 12X15 (MISCELLANEOUS) ×1 IMPLANT
BASE TIBIAL ROT PLAT SZ 3 KNEE (Knees) IMPLANT
BLADE SAW SGTL 11.0X1.19X90.0M (BLADE) ×1 IMPLANT
BLADE SAW SGTL 13.0X1.19X90.0M (BLADE) ×2 IMPLANT
BLADE SURG SZ10 CARB STEEL (BLADE) ×2 IMPLANT
BNDG ELASTIC 6X5.8 VLCR STR LF (GAUZE/BANDAGES/DRESSINGS) ×2 IMPLANT
BOWL SMART MIX CTS (DISPOSABLE) ×2 IMPLANT
CEMENT HV SMART SET (Cement) ×2 IMPLANT
CUFF TOURN SGL QUICK 34 (TOURNIQUET CUFF)
CUFF TRNQT CYL 34X4.125X (TOURNIQUET CUFF) ×1 IMPLANT
DERMABOND ADVANCED (GAUZE/BANDAGES/DRESSINGS) ×1
DERMABOND ADVANCED .7 DNX12 (GAUZE/BANDAGES/DRESSINGS) ×1 IMPLANT
DRAPE INCISE IOBAN 66X45 STRL (DRAPES) ×1 IMPLANT
DRAPE U-SHAPE 47X51 STRL (DRAPES) ×2 IMPLANT
DRESSING AQUACEL AG SP 3.5X10 (GAUZE/BANDAGES/DRESSINGS) ×1 IMPLANT
DRSG AQUACEL AG ADV 3.5X10 (GAUZE/BANDAGES/DRESSINGS) ×1 IMPLANT
DRSG AQUACEL AG SP 3.5X10 (GAUZE/BANDAGES/DRESSINGS) ×2
DURAPREP 26ML APPLICATOR (WOUND CARE) ×4 IMPLANT
ELECT REM PT RETURN 15FT ADLT (MISCELLANEOUS) ×2 IMPLANT
GLOVE SURG ENC MOIS LTX SZ6 (GLOVE) ×1 IMPLANT
GLOVE SURG ENC MOIS LTX SZ7 (GLOVE) ×1 IMPLANT
GLOVE SURG POLYISO LF SZ6 (GLOVE) ×2 IMPLANT
GLOVE SURG POLYISO LF SZ7 (GLOVE) ×2 IMPLANT
GLOVE SURG UNDER POLY LF SZ7.5 (GLOVE) ×2 IMPLANT
GOWN STRL REUS W/TWL LRG LVL3 (GOWN DISPOSABLE) ×4 IMPLANT
HANDPIECE INTERPULSE COAX TIP (DISPOSABLE) ×2
HOLDER FOLEY CATH W/STRAP (MISCELLANEOUS) ×1 IMPLANT
KIT TURNOVER KIT A (KITS) ×1 IMPLANT
MANIFOLD NEPTUNE II (INSTRUMENTS) ×2 IMPLANT
NDL SAFETY ECLIPSE 18X1.5 (NEEDLE) IMPLANT
NEEDLE HYPO 18GX1.5 SHARP (NEEDLE) ×2
NS IRRIG 1000ML POUR BTL (IV SOLUTION) ×2 IMPLANT
PACK TOTAL KNEE CUSTOM (KITS) ×2 IMPLANT
PROTECTOR NERVE ULNAR (MISCELLANEOUS) ×2 IMPLANT
SET HNDPC FAN SPRY TIP SCT (DISPOSABLE) ×1 IMPLANT
SET PAD KNEE POSITIONER (MISCELLANEOUS) ×2 IMPLANT
SPIKE FLUID TRANSFER (MISCELLANEOUS) ×3 IMPLANT
SPONGE T-LAP 18X18 ~~LOC~~+RFID (SPONGE) ×5 IMPLANT
SUT MNCRL AB 4-0 PS2 18 (SUTURE) ×2 IMPLANT
SUT STRATAFIX PDS+ 0 24IN (SUTURE) ×2 IMPLANT
SUT VIC AB 1 CT1 36 (SUTURE) ×2 IMPLANT
SUT VIC AB 2-0 CT1 27 (SUTURE) ×6
SUT VIC AB 2-0 CT1 TAPERPNT 27 (SUTURE) ×3 IMPLANT
SYR 3ML LL SCALE MARK (SYRINGE) ×2 IMPLANT
TIBIAL BASE ROT PLAT SZ 3 KNEE (Knees) ×2 IMPLANT
TRAY FOL W/BAG SLVR 16FR STRL (SET/KITS/TRAYS/PACK) IMPLANT
TRAY FOLEY MTR SLVR 16FR STAT (SET/KITS/TRAYS/PACK) ×1 IMPLANT
TRAY FOLEY W/BAG SLVR 16FR LF (SET/KITS/TRAYS/PACK) ×2
TUBE SUCTION HIGH CAP CLEAR NV (SUCTIONS) ×1 IMPLANT
WATER STERILE IRR 1000ML POUR (IV SOLUTION) ×4 IMPLANT
WRAP KNEE MAXI GEL POST OP (GAUZE/BANDAGES/DRESSINGS) ×2 IMPLANT

## 2021-02-18 NOTE — Progress Notes (Signed)
Patient was notified that her surgeon has been delayed.  Patient verbally upset.  Apologized to patient for the inconvenience.  Patient wanting to get up to use the bathroom, explained to patient she could not because she had just had her nerve block.  Offered patient the bedpan, she initially refused but then changed her mind.  Placed patient on the bedpan at this time.

## 2021-02-18 NOTE — Transfer of Care (Signed)
Immediate Anesthesia Transfer of Care Note  Patient: Tara Fernandez  Procedure(s) Performed: TOTAL KNEE ARTHROPLASTY (Right: Knee)  Patient Location: PACU  Anesthesia Type:Spinal  Level of Consciousness: awake  Airway & Oxygen Therapy: Patient Spontanous Breathing and Patient connected to face mask oxygen  Post-op Assessment: Report given to RN and Post -op Vital signs reviewed and stable  Post vital signs: Reviewed and stable  Last Vitals:  Vitals Value Taken Time  BP 109/70 02/18/21 1613  Temp    Pulse 75 02/18/21 1615  Resp 20 02/18/21 1615  SpO2 100 % 02/18/21 1615  Vitals shown include unvalidated device data.  Last Pain:  Vitals:   02/18/21 1245  TempSrc:   PainSc: 0-No pain      Patients Stated Pain Goal: 5 (15/83/09 4076)  Complications: No notable events documented.

## 2021-02-18 NOTE — H&P (Signed)
TOTAL KNEE ADMISSION H&P  Patient is being admitted for right total knee arthroplasty.  Subjective:  Chief Complaint:right knee pain.  HPI: Tara Fernandez, 71 y.o. female, has a history of pain and functional disability in the right knee due to arthritis and has failed non-surgical conservative treatments for greater than 12 weeks to includeNSAID's and/or analgesics, corticosteriod injections, viscosupplementation injections, and activity modification.  Onset of symptoms was gradual, starting 2 years ago with gradually worsening course since that time. The patient noted prior procedures on the knee to include  arthroscopy and menisectomy on the right knee(s).  Patient currently rates pain in the right knee(s) at 8 out of 10 with activity. Patient has worsening of pain with activity and weight bearing, pain that interferes with activities of daily living, and pain with passive range of motion.  Patient has evidence of joint space narrowing by imaging studies.  There is no active infection.  Patient Active Problem List   Diagnosis Date Noted   Preoperative clearance 12/24/2020   Adhesive capsulitis of right shoulder 07/25/2020   Right shoulder injury, initial encounter 07/05/2020   Chronic right shoulder pain 03/02/2019   Chondromalacia of right patellofemoral joint 05/18/2013   Leg length discrepancy 04/15/2013   Arthritis of right hip 02/11/2013   Status post THR (total hip replacement) 02/11/2013   Degenerative arthritis of hip 06/17/2012   Past Medical History:  Diagnosis Date   Arthritis    osteoarthritis; PAIN RIGHT HIP   PONV (postoperative nausea and vomiting)     Past Surgical History:  Procedure Laterality Date   ANTERIOR AND POSTERIOR REPAIR N/A 03/05/2015   Procedure: ANTERIOR (CYSTOCELE) REPAIR, VAULT PROLAPSE AND GRAFT;  Surgeon: Bjorn Loser, MD;  Location: [facility];  Service: Urology;  Laterality: N/A;   BREAST MASS EXCISION     Hx: of left breast   CARTILAGE  SURGERY  10 th grade   RIGHT KNEE   CYSTOSCOPY N/A 03/05/2015   Procedure: CYSTOSCOPY;  Surgeon: Bjorn Loser, MD;  Location: [facility];  Service: Urology;  Laterality: N/A;   LAPAROSCOPIC VAGINAL HYSTERECTOMY WITH SALPINGECTOMY Bilateral 03/05/2015   Procedure: LAPAROSCOPIC ASSISTED VAGINAL HYSTERECTOMY WITH SALPINGECTOMY;  Surgeon: Megan Salon, MD;  Location: [facility];  Service: Gynecology;  Laterality: Bilateral;   TOTAL HIP ARTHROPLASTY Left 06/17/2012   Procedure: LEFT TOTAL HIP ARTHROPLASTY ANTERIOR APPROACH;  Surgeon: Mcarthur Rossetti, MD;  Location: [facility];  Service: Orthopedics;  Laterality: Left;   TOTAL HIP ARTHROPLASTY Right 02/11/2013   Procedure: RIGHT TOTAL HIP ARTHROPLASTY ANTERIOR APPROACH;  Surgeon: Mcarthur Rossetti, MD;  Location: [facility];  Service: Orthopedics;  Laterality: Right;    Current Facility-Administered Medications  Medication Dose Route Frequency Provider Last Rate Last Admin   ceFAZolin (ANCEF) IVPB 2g/100 mL premix  2 g Intravenous On Call to OR Irving Copas, PA-C       dexamethasone (DECADRON) injection 8 mg  8 mg Intravenous Once Irving Copas, PA-C       fentaNYL (SUBLIMAZE) injection 50-100 mcg  50-100 mcg Intravenous Pollyann Glen, MD   50 mcg at 02/18/21 1234   lactated ringers infusion   Intravenous Continuous Costella Hatcher R, PA-C       lactated ringers infusion   Intravenous Continuous Barnet Glasgow, MD 10 mL/hr at 02/18/21 1050 New Bag at 02/18/21 1050   midazolam (VERSED) injection 1-2 mg  1-2 mg Intravenous Pollyann Glen, MD   1 mg at 02/18/21 1234   tranexamic acid (CYKLOKAPRON)  IVPB 1,000 mg  1,000 mg Intravenous To OR Irving Copas, PA-C       Facility-Administered Medications Ordered in Other Encounters  Medication Dose Route Frequency Provider Last Rate Last Admin   bupivacaine(PF) (MARCAINE) 0.5 % injection   Peri-NEURAL Anesthesia Intra-op Myrtie Soman, MD   20 mL at 02/18/21 1234   Allergies  Allergen  Reactions   Tramadol Other (See Comments)    Causes numbness in hands and right side of body   Bactrim [Sulfamethoxazole-Trimethoprim]     Pt unsure of reaction    Chlorhexidine Itching   Other     PT STATES SHE CAN TAKE HYDROCODONE/ACETAMINOPHEN FOR PAIN  WITH OUT ANY PROBLEMS.  STATES - IN THE PAST - OTHER PAIN MEDS CAUSED NAUSEA OR RASH OR FAST HEART BEAT - BUT SHE DOESN'T KNOW NAMES OF THOSE MEDS   STATES NO PROBLEMS WITH ANY PAIN MEDS GIVEN IN HOSPITAL.   Premarin [Conjugated Estrogens] Other (See Comments)    Headache, dizziness, and pelvic cramping.   Zithromax [Azithromycin] Swelling    Area around eyes swollen and red   Ciprofloxacin Hives, Itching and Rash    "joint pain", patient reports hives, rash, and itching   Latex Rash    HIVES - AFTER DENTIST USED LATEX GLOVES    Social History   Tobacco Use   Smoking status: Never   Smokeless tobacco: Never  Substance Use Topics   Alcohol use: Not Currently    Comment: occ    Family History  Problem Relation Age of Onset   Heart attack Father    Cancer Father    Tongue cancer Father    Breast cancer Sister 92   Lung cancer Sister    Cancer Sister    Lung cancer Mother    Cancer Mother    Lupus Sister    Cancer Sister    Breast cancer Sister    Healthy Brother    Cancer Maternal Aunt    Cancer Maternal Uncle    Cancer Paternal Aunt    Cancer Paternal Uncle    Healthy Brother    Diabetes Neg Hx    Hypertension Neg Hx    Thyroid disease Neg Hx      Review of Systems  Constitutional:  Negative for chills and fever.  Respiratory:  Negative for cough and shortness of breath.   Cardiovascular:  Negative for chest pain.  Gastrointestinal:  Negative for nausea and vomiting.  Musculoskeletal:  Positive for arthralgias.    Objective:  Physical Exam Well nourished and well developed. General: Alert and oriented x3, cooperative and pleasant, no acute distress. Head: normocephalic, atraumatic, neck supple. Eyes:  EOMI.  Musculoskeletal: Right knee exam: Valgus right knee associated with tenderness laterally and anteriorly No palpable effusion, warmth or erythema Slight flexion contracture Discomfort with passive correction of her valgus No lower extremity edema, erythema or calf tenderness  Calves soft and nontender. Motor function intact in LE. Strength 5/5 LE bilaterally. Neuro: Distal pulses 2+. Sensation to light touch intact in LE.  Vital signs in last 24 hours: Temp:  [98.6 F (37 C)] 98.6 F (37 C) (02/13 1039) Pulse Rate:  [70-77] 72 (02/13 1245) Resp:  [16-18] 17 (02/13 1245) BP: (112-131)/(73-84) 117/83 (02/13 1245) SpO2:  [98 %-100 %] 100 % (02/13 1245) Weight:  [62.6 kg] 62.6 kg (02/13 1023)  Labs:   Estimated body mass index is 23.69 kg/m as calculated from the following:   Height as of this encounter: 5\' 4"  (1.626  m).   Weight as of this encounter: 62.6 kg.   Imaging Review Plain radiographs demonstrate severe degenerative joint disease of the right knee(s). The overall alignment isneutral. The bone quality appears to be adequate for age and reported activity level.      Assessment/Plan:  End stage arthritis, right knee   The patient history, physical examination, clinical judgment of the provider and imaging studies are consistent with end stage degenerative joint disease of the right knee(s) and total knee arthroplasty is deemed medically necessary. The treatment options including medical management, injection therapy arthroscopy and arthroplasty were discussed at length. The risks and benefits of total knee arthroplasty were presented and reviewed. The risks due to aseptic loosening, infection, stiffness, patella tracking problems, thromboembolic complications and other imponderables were discussed. The patient acknowledged the explanation, agreed to proceed with the plan and consent was signed. Patient is being admitted for inpatient treatment for surgery, pain  control, PT, OT, prophylactic antibiotics, VTE prophylaxis, progressive ambulation and ADL's and discharge planning. The patient is planning to be discharged  home.  Therapy Plans: outpatient therapy Disposition: Home with Planned DVT Prophylaxis: aspirin 81mg  BID DME needed: walker PCP: Dr. De [place], clearance received TXA: IV Allergies: chlorhexidine - unknown , cipro - unknown , tramadol - unknown Anesthesia Concerns: none BMI: 25.5 Last HgbA1c: Not diabetic   Other: - Oxycodone, celebrex, tylenol   Patient's anticipated LOS is less than 2 midnights, meeting these requirements: - Younger than 32 - Lives within 1 hour of care - Has a competent adult at home to recover with post-op recover - NO history of  - Chronic pain requiring opiods  - Diabetes  - Coronary Artery Disease  - Heart failure  - Heart attack  - Stroke  - DVT/VTE  - Cardiac arrhythmia  - Respiratory Failure/COPD  - Renal failure  - Anemia  - Advanced Liver disease  Costella Hatcher, PA-C Orthopedic Surgery [facility] [place] [phone]

## 2021-02-18 NOTE — Interval H&P Note (Signed)
History and Physical Interval Note:  02/18/2021 2:19 PM  Tara Fernandez  has presented today for surgery, with the diagnosis of Right knee osteoarthritis.  The various methods of treatment have been discussed with the patient and family. After consideration of risks, benefits and other options for treatment, the patient has consented to  Procedure(s): TOTAL KNEE ARTHROPLASTY (Right) as a surgical intervention.  The patient's history has been reviewed, patient examined, no change in status, stable for surgery.  I have reviewed the patient's chart and labs.  Questions were answered to the patient's satisfaction.     Mauri Pole

## 2021-02-18 NOTE — Discharge Instructions (Signed)

## 2021-02-18 NOTE — Op Note (Signed)
NAME:  Tara Fernandez                      MEDICAL RECORD NO.:  [id number]                             FACILITY:  [facility]      PHYSICIAN:  Pietro Cassis. Alvan Dame, M.D.  DATE OF BIRTH:  March 22, 1950      DATE OF PROCEDURE:  02/18/2021                                     OPERATIVE REPORT         PREOPERATIVE DIAGNOSIS:  Right knee osteoarthritis.      POSTOPERATIVE DIAGNOSIS:  Right knee osteoarthritis.      FINDINGS:  The patient was noted to have complete loss of cartilage and   bone-on-bone arthritis with associated osteophytes in the lateral and patellofemoral compartments of   the knee with a significant synovitis and associated effusion.  The patient had failed months of conservative treatment including medications, injection therapy, activity modification.     PROCEDURE:  Right total knee replacement.      COMPONENTS USED:  DePuy Attune rotating platform posterior stabilized knee   system, a size 4N femur, 3 tibia, size 8 mm PS AOX insert, and 35 anatomic patellar   button.      SURGEON:  Pietro Cassis. Alvan Dame, M.D.      ASSISTANT:  Costella Hatcher, PA-C.      ANESTHESIA:  Regional and Spinal.      SPECIMENS:  None.      COMPLICATION:  None.      DRAINS:  None.  EBL: <100 cc      TOURNIQUET TIME:  30 min at 250 mmHg     The patient was stable to the recovery room.      INDICATION FOR PROCEDURE:  Tara Fernandez is a 71 y.o. female patient of   mine.  The patient had been seen, evaluated, and treated for months conservatively in the   office with medication, activity modification, and injections.  The patient had   radiographic changes of bone-on-bone arthritis with endplate sclerosis and osteophytes noted.  Based on the radiographic changes and failed conservative measures, the patient   decided to proceed with definitive treatment, total knee replacement.  Risks of infection, DVT, component failure, need for revision surgery, neurovascular injury were reviewed in the office  setting.  The postop course was reviewed stressing the efforts to maximize post-operative satisfaction and function.  Consent was obtained for benefit of pain   relief.      PROCEDURE IN DETAIL:  The patient was brought to the operative theater.   Once adequate anesthesia, preoperative antibiotics, 2 gm of Ancef,1 gm of Tranexamic Acid, and 10 mg of Decadron administered, the patient was positioned supine with a right thigh tourniquet placed.  The  right lower extremity was prepped and draped in sterile fashion.  A time-   out was performed identifying the patient, planned procedure, and the appropriate extremity.      The right lower extremity was placed in the [facility] leg holder.  The leg was   exsanguinated, tourniquet elevated to 250 mmHg.  A midline incision was   made followed by median parapatellar arthrotomy.  Following initial   exposure, attention was  first directed to the patella.  Precut   measurement was noted to be 21 mm.  I resected down to 13 mm and used a   35 anatomic patellar button to restore patellar height as well as cover the cut surface.      The lug holes were drilled and a metal shim was placed to protect the   patella from retractors and saw blade during the procedure.      At this point, attention was now directed to the femur.  The femoral   canal was opened with a drill, irrigated to try to prevent fat emboli.  An   intramedullary rod was passed at 3 degrees valgus, 9 mm of bone was   resected off the distal femur.  Following this resection, the tibia was   subluxated anteriorly.  Using the extramedullary guide, 2 mm of bone was resected off   the proximal lateral tibia.  We confirmed the gap would be   stable medially and laterally with a size 6 spacer block as well as confirmed that the tibial cut was perpendicular in the coronal plane, checking with an alignment rod.      Once this was done, I sized the femur to be a size 4 in the anterior-   posterior  dimension, chose a narrow component based on medial and   lateral dimension.  The size 4 rotation block was then pinned in   position anterior referenced using the C-clamp to set rotation.  The   anterior, posterior, and  chamfer cuts were made without difficulty nor   notching making certain that I was along the anterior cortex to help   with flexion gap stability.      The final box cut was made off the lateral aspect of distal femur.      At this point, the tibia was sized to be a size 3.  The size 3 tray was   then pinned in position through the medial third of the tubercle,   drilled, and keel punched.  Trial reduction was now carried with a 4 femur,  3 tibia, a size 8 mm PS insert, and the 35 anatomic patella botton.  The knee was brought to full extension with good flexion stability with the patella   tracking through the trochlea without application of pressure.  Given   all these findings the trial components removed.  Final components were   opened and cement was mixed.  The knee was irrigated with normal saline solution and pulse lavage.  The synovial lining was   then injected with 30 cc of 0.25% Marcaine with epinephrine, 1 cc of Toradol and 30 cc of NS for a total of 61 cc.     Final implants were then cemented onto cleaned and dried cut surfaces of bone with the knee brought to extension with a size 8 mm PS trial insert.      Once the cement had fully cured, excess cement was removed   throughout the knee.  I confirmed that I was satisfied with the range of   motion and stability, and the final size 8 mm PS AOX insert was chosen.  It was   placed into the knee.      The tourniquet had been let down at 30 minutes.  No significant   hemostasis was required.  The extensor mechanism was then reapproximated using #1 Vicryl and #1 Stratafix sutures with the knee   in flexion.  The  remaining wound was closed with 2-0 Vicryl and running 4-0 Monocryl.   The knee was cleaned,  dried, dressed sterilely using Dermabond and   Aquacel dressing.  The patient was then   brought to recovery room in stable condition, tolerating the procedure   well.   Please note that Physician Assistant, Costella Hatcher, PA-C was present for the entirety of the case, and was utilized for pre-operative positioning, peri-operative retractor management, general facilitation of the procedure and for primary wound closure at the end of the case.              Pietro Cassis Alvan Dame, M.D.    02/18/2021 2:44 PM

## 2021-02-18 NOTE — Progress Notes (Signed)
Patient removed herself from bedpan, bedpan sitting on bed.  Patient in bed.  Patient did not void.  Patient was given the bedside phone to call her husband also.

## 2021-02-18 NOTE — Anesthesia Preprocedure Evaluation (Signed)
Anesthesia Evaluation  Patient identified by MRN, date of birth, ID band Patient awake    Reviewed: Allergy & Precautions, NPO status , Patient's Chart, lab work & pertinent test results  History of Anesthesia Complications (+) PONV and history of anesthetic complications  Airway Mallampati: II  TM Distance: >3 FB Neck ROM: Full    Dental no notable dental hx.    Pulmonary neg pulmonary ROS,    Pulmonary exam normal breath sounds clear to auscultation       Cardiovascular negative cardio ROS Normal cardiovascular exam Rhythm:Regular Rate:Normal     Neuro/Psych negative neurological ROS  negative psych ROS   GI/Hepatic negative GI ROS, Neg liver ROS,   Endo/Other  negative endocrine ROS  Renal/GU negative Renal ROS  negative genitourinary   Musculoskeletal  (+) Arthritis , Osteoarthritis,    Abdominal   Peds negative pediatric ROS (+)  Hematology negative hematology ROS (+)   Anesthesia Other Findings   Reproductive/Obstetrics negative OB ROS                             Anesthesia Physical Anesthesia Plan  ASA: 2  Anesthesia Plan: Spinal   Post-op Pain Management: Regional block   Induction: Intravenous  PONV Risk Score and Plan: 3 and Ondansetron, Dexamethasone, Propofol infusion and Treatment may vary due to age or medical condition  Airway Management Planned: Simple Face Mask  Additional Equipment:   Intra-op Plan:   Post-operative Plan:   Informed Consent: I have reviewed the patients History and Physical, chart, labs and discussed the procedure including the risks, benefits and alternatives for the proposed anesthesia with the patient or authorized representative who has indicated his/her understanding and acceptance.     Dental advisory given  Plan Discussed with: CRNA and Surgeon  Anesthesia Plan Comments:         Anesthesia Quick Evaluation

## 2021-02-18 NOTE — Anesthesia Procedure Notes (Signed)
Anesthesia Regional Block: Adductor canal block   Pre-Anesthetic Checklist: , timeout performed,  Correct Patient, Correct Site, Correct Laterality,  Correct Procedure, Correct Position, site marked,  Risks and benefits discussed,  Surgical consent,  Pre-op evaluation,  At surgeon's request and post-op pain management  Laterality: Right  Prep: chloraprep       Needles:  Injection technique: Single-shot  Needle Type: Echogenic Needle     Needle Length: 9cm      Additional Needles:   Procedures:,,,, ultrasound used (permanent image in chart),,    Narrative:  Start time: 02/18/2021 12:27 PM End time: 02/18/2021 12:34 PM Injection made incrementally with aspirations every 5 mL.  Performed by: Personally  Anesthesiologist: Myrtie Soman, MD  Additional Notes: Patient tolerated the procedure well without complications

## 2021-02-18 NOTE — Anesthesia Procedure Notes (Signed)
Spinal  Patient location during procedure: OR Start time: 02/18/2021 2:30 PM End time: 02/18/2021 2:34 PM Reason for block: surgical anesthesia Staffing Performed: anesthesiologist  Anesthesiologist: Myrtie Soman, MD Preanesthetic Checklist Completed: patient identified, IV checked, site marked, risks and benefits discussed, surgical consent, monitors and equipment checked, pre-op evaluation and timeout performed Spinal Block Patient position: sitting Prep: Betadine Patient monitoring: heart rate, continuous pulse ox and blood pressure Approach: midline Location: L4-5 Injection technique: single-shot Needle Needle type: Sprotte  Needle gauge: 24 G Needle length: 9 cm Assessment Sensory level: T6 Events: CSF return Additional Notes

## 2021-02-18 NOTE — Plan of Care (Signed)

## 2021-02-18 NOTE — Care Plan (Signed)
Ortho Bundle Case Management Note  Patient Details  Name: Tara Fernandez MRN: [id number] Date of Birth: 1950-07-30                  R TKA on 02-18-21 DCP: Home with husband DME: Has a RW. Sent order for ice machine to [employer] on patient's request. PT [facility]. Referral faxed. She states she is already scheduled for an appt.   DME Arranged:  Other see comment (Cold Therapy Unit) DME Agency:  Other - Comment ([employer])  HH Arranged:    HH Agency:     Additional Comments: Please contact me with any questions of if this plan should need to change.  Marianne Sofia, RN,CCM [facility]  [phone] 02/18/2021, 11:00 AM

## 2021-02-18 NOTE — Anesthesia Procedure Notes (Signed)
Anesthesia Procedure Image    

## 2021-02-18 NOTE — Anesthesia Postprocedure Evaluation (Signed)
Anesthesia Post Note  Patient: Tara Fernandez  Procedure(s) Performed: TOTAL KNEE ARTHROPLASTY (Right: Knee)     Patient location during evaluation: PACU Anesthesia Type: Spinal Level of consciousness: awake, awake and alert and oriented Pain management: pain level controlled Vital Signs Assessment: post-procedure vital signs reviewed and stable Respiratory status: spontaneous breathing, nonlabored ventilation and respiratory function stable Cardiovascular status: blood pressure returned to baseline and stable Postop Assessment: no headache, no backache, spinal receding and no apparent nausea or vomiting Anesthetic complications: no   No notable events documented.  Last Vitals:  Vitals:   02/18/21 1645 02/18/21 1700  BP: 115/71 123/78  Pulse: 78 78  Resp: 12 19  Temp:    SpO2: 99% 93%    Last Pain:  Vitals:   02/18/21 1700  TempSrc:   PainSc: 0-No pain                 Santa Lighter

## 2021-02-18 NOTE — Progress Notes (Signed)
Assisted Dr. Rose with right, ultrasound guided, adductor canal block. Side rails up, monitors on throughout procedure. See vital signs in flow sheet. Tolerated Procedure well.  

## 2021-02-18 NOTE — Anesthesia Procedure Notes (Signed)
Date/Time: 02/18/2021 2:30 PM Performed by: Sharlette Dense, CRNA Oxygen Delivery Method: Simple face mask

## 2021-02-19 ENCOUNTER — Encounter (HOSPITAL_COMMUNITY): Payer: Self-pay | Admitting: Orthopedic Surgery

## 2021-02-19 DIAGNOSIS — Z91040 Latex allergy status: Secondary | ICD-10-CM | POA: Diagnosis not present

## 2021-02-19 DIAGNOSIS — M1711 Unilateral primary osteoarthritis, right knee: Secondary | ICD-10-CM | POA: Diagnosis not present

## 2021-02-19 DIAGNOSIS — Z96643 Presence of artificial hip joint, bilateral: Secondary | ICD-10-CM | POA: Diagnosis not present

## 2021-02-19 LAB — BASIC METABOLIC PANEL
Anion gap: 4 — ABNORMAL LOW (ref 5–15)
BUN: 11 mg/dL (ref 8–23)
CO2: 25 mmol/L (ref 22–32)
Calcium: 8.1 mg/dL — ABNORMAL LOW (ref 8.9–10.3)
Chloride: 105 mmol/L (ref 98–111)
Creatinine, Ser: 0.52 mg/dL (ref 0.44–1.00)
GFR, Estimated: >60 mL/min (ref >60)
Glucose, Bld: 143 mg/dL — ABNORMAL HIGH (ref 70–99)
Potassium: 4.1 mmol/L (ref 3.5–5.1)
Sodium: 134 mmol/L — ABNORMAL LOW (ref 135–145)

## 2021-02-19 LAB — CBC
HCT: 33.9 % — ABNORMAL LOW (ref 36.0–46.0)
Hemoglobin: 11.7 g/dL — ABNORMAL LOW (ref 12.0–15.0)
MCH: 31.9 pg (ref 26.0–34.0)
MCHC: 34.5 g/dL (ref 30.0–36.0)
MCV: 92.4 fL (ref 80.0–100.0)
Platelets: 189 10*3/uL (ref 150–400)
RBC: 3.67 MIL/uL — ABNORMAL LOW (ref 3.87–5.11)
RDW: 13 % (ref 11.5–15.5)
WBC: 11.9 10*3/uL — ABNORMAL HIGH (ref 4.0–10.5)
nRBC: 0 % (ref 0.0–0.2)

## 2021-02-19 MED ORDER — DOCUSATE SODIUM 100 MG PO CAPS: 100.0000 mg | ORAL_CAPSULE | Freq: Two times a day (BID) | ORAL | 0 refills | Status: DC

## 2021-02-19 MED ORDER — SODIUM CHLORIDE 0.9 % IV BOLUS
250.0000 mL | Freq: Once | INTRAVENOUS | Status: AC
  Administered 2021-02-19: 250 mL via INTRAVENOUS

## 2021-02-19 MED ORDER — HYDROMORPHONE HCL 2 MG PO TABS
2.0000 mg | ORAL_TABLET | ORAL | Status: DC | PRN
  Administered 2021-02-19: 2 mg via ORAL
  Filled 2021-02-19: qty 1

## 2021-02-19 MED ORDER — POLYETHYLENE GLYCOL 3350 17 G PO PACK: 17.0000 g | PACK | Freq: Every day | ORAL | 0 refills | Status: DC | PRN

## 2021-02-19 MED ORDER — METHOCARBAMOL 500 MG PO TABS: 500.0000 mg | ORAL_TABLET | Freq: Four times a day (QID) | ORAL | 0 refills | Status: DC | PRN

## 2021-02-19 MED ORDER — OXYCODONE HCL 5 MG PO TABS
10.0000 mg | ORAL_TABLET | Freq: Once | ORAL | Status: AC
  Administered 2021-02-19: 10 mg via ORAL
  Filled 2021-02-19: qty 2

## 2021-02-19 MED ORDER — ASPIRIN 81 MG PO CHEW: 81.0000 mg | CHEWABLE_TABLET | Freq: Two times a day (BID) | ORAL | 0 refills | Status: AC

## 2021-02-19 MED ORDER — OXYCODONE HCL 5 MG PO TABS: 5.0000 mg | ORAL_TABLET | ORAL | 0 refills | Status: DC | PRN

## 2021-02-19 NOTE — Plan of Care (Signed)
°  Problem: Education: Goal: Knowledge of the prescribed therapeutic regimen will improve Outcome: Progressing   Problem: Activity: Goal: Ability to avoid complications of mobility impairment will improve Outcome: Progressing   Problem: Clinical Measurements: Goal: Postoperative complications will be avoided or minimized Outcome: Progressing   Problem: Pain Management: Goal: Pain level will decrease with appropriate interventions Outcome: Progressing   Problem: Skin Integrity: Goal: Will show signs of wound healing Outcome: Progressing   Problem: Education: Goal: Knowledge of the prescribed therapeutic regimen will improve Outcome: Progressing

## 2021-02-19 NOTE — TOC Transition Note (Signed)
Transition of Care [facility]) - CM/SW Discharge Note   Patient Details  Name: Tara Fernandez MRN: [id number] Date of Birth: June 30, 1950  [facility] CM/SW Contact:  Lennart Pall, LCSW Phone Number: 02/19/2021, 12:34 PM   Clinical Narrative:     Met briefly with pt and confirming she has all needed DME at home.  Plan for OPPT at [facility].  No TOC needs.  Final next level of care: OP Rehab Barriers to Discharge: No Barriers Identified   Patient Goals and CMS Choice Patient states their goals for this hospitalization and ongoing recovery are:: return home      Discharge Placement                       Discharge Plan and Services                DME Arranged: N/A DME Agency: NA                  Social Determinants of Health (SDOH) Interventions     Readmission Risk Interventions No flowsheet data found.

## 2021-02-19 NOTE — Progress Notes (Signed)
Provided discharge education/instructions, all questions and concerns addressed. Pt not in acute distress, discharged home with belongings accompanied by husband. 

## 2021-02-19 NOTE — Evaluation (Signed)
Physical Therapy Evaluation °Patient Details °Name: Tara Fernandez °MRN: [id number] °DOB: 03/17/1950 °Today's Date: 02/19/2021 ° °History of Present Illness ° 71 y.o. female admitted 02/18/21 for R TKA. PMH includes B THA, OA.  °Clinical Impression ° Pt is mobilizing well, she ambulated 220' with RW, no loss of balance. Stair training completed. Pt demonstrates good understanding of HEP.  She is ready to DC home from a PT standpoint. Noted pt had low BP earlier this morning, at end of PT session, BP was 109/73 in sitting. She denied dizziness throughout PT session. °   ° °Recommendations for follow up therapy are one component of a multi-disciplinary discharge planning process, led by the attending physician.  Recommendations may be updated based on patient status, additional functional criteria and insurance authorization. ° °Follow Up Recommendations Follow physician's recommendations for discharge plan and follow up therapies ° °  °Assistance Recommended at Discharge Intermittent Supervision/Assistance  °Patient can return home with the following ° Assistance with cooking/housework;Assist for transportation;Help with stairs or ramp for entrance ° °  °Equipment Recommendations None recommended by PT  °Recommendations for Other Services °    °  °Functional Status Assessment Patient has had a recent decline in their functional status and demonstrates the ability to make significant improvements in function in a reasonable and predictable amount of time.  ° °  °Precautions / Restrictions Precautions °Precautions: Knee °Precaution Booklet Issued: Yes (comment) °Precaution Comments: reviewed no pillow under knee °Restrictions °RLE Weight Bearing: Weight bearing as tolerated  ° °  ° °Mobility ° Bed Mobility °Overal bed mobility: Modified Independent °  °  °  °  °  °  °  °  ° °Transfers °Overall transfer level: Needs assistance °Equipment used: Rolling walker (2 wheels) °Transfers: Sit to/from Stand °Sit to Stand:  Supervision °  °  °  °  °  °General transfer comment: VCs hand placement °  ° °Ambulation/Gait °Ambulation/Gait assistance: Modified independent (Device/Increase time) °Gait Distance (Feet): 220 Feet °Assistive device: Rolling walker (2 wheels) °Gait Pattern/deviations: Step-to pattern °Gait velocity: decr °  °  °General Gait Details: steady, no loss of balance, VCs sequencing ° °Stairs °Stairs: Yes °Stairs assistance: Min assist °Stair Management: Step to pattern, Forwards, With walker °Number of Stairs: 1 °General stair comments: 1 step x 2 trials, VCs sequencing ° °Wheelchair Mobility °  ° °Modified Rankin (Stroke Patients Only) °  ° °  ° °Balance Overall balance assessment: Modified Independent °  °  °  °  °  °  °  °  °  °  °  °  °  °  °  °  °  °  °   ° ° ° °Pertinent Vitals/Pain Pain Assessment °Pain Assessment: 0-10 °Pain Score: 6  °Pain Location: R knee with walking °Pain Descriptors / Indicators: Sore °Pain Intervention(s): Limited activity within patient's tolerance, Monitored during session, Premedicated before session, Ice applied, Repositioned  ° ° °Home Living Family/patient expects to be discharged to:: Private residence °Living Arrangements: Spouse/significant other °Available Help at Discharge: Family;Available 24 hours/day °Type of Home: House °Home Access: Stairs to enter °Entrance Stairs-Rails: Left °Entrance Stairs-Number of Steps: 1 °  °Home Layout: One level °Home Equipment: Rolling Walker (2 wheels);Cane - single point;BSC/3in1 °   °  °Prior Function Prior Level of Function : Independent/Modified Independent °  °  °  °  °  °  °  °  °  ° ° °Hand Dominance  °   ° °  °  Extremity/Trunk Assessment  ° Upper Extremity Assessment °Upper Extremity Assessment: Overall WFL for tasks assessed °  ° °Lower Extremity Assessment °Lower Extremity Assessment: RLE deficits/detail °RLE Deficits / Details: SLR -3/5, knee AAROM 0-75* °RLE Sensation: WNL °RLE Coordination: WNL °  ° °Cervical / Trunk  Assessment °Cervical / Trunk Assessment: Normal  °Communication  ° Communication: No difficulties  °Cognition Arousal/Alertness: Awake/alert °Behavior During Therapy: WFL for tasks assessed/performed °Overall Cognitive Status: Within Functional Limits for tasks assessed °  °  °  °  °  °  °  °  °  °  °  °  °  °  °  °  °  °  °  ° °  °General Comments   ° °  °Exercises Total Joint Exercises °Ankle Circles/Pumps: AROM, Both, 10 reps, Supine °Quad Sets: AROM, Both, 5 reps, Supine °Short Arc Quad: AROM, Left, 10 reps, Supine °Heel Slides: AAROM, Right, 10 reps, Supine °Hip ABduction/ADduction: AROM, Right, 10 reps, Supine °Straight Leg Raises: AAROM, Right, 10 reps, Supine °Long Arc Quad: AROM, Right, 5 reps, Seated °Knee Flexion: AAROM, Right, 10 reps, Seated °Goniometric ROM: 0-70* AAROM R knee  ° °Assessment/Plan  °  °PT Assessment All further PT needs can be met in the next venue of care  °PT Problem List Decreased strength;Decreased mobility;Decreased activity tolerance;Decreased balance;Pain;Decreased range of motion ° °   °  °PT Treatment Interventions     ° °PT Goals (Current goals can be found in the Care Plan section)  °Acute Rehab PT Goals °Patient Stated Goal: dog sitting, house sitting °PT Goal Formulation: All assessment and education complete, DC therapy ° °  °Frequency   °  ° ° °Co-evaluation   °  °  °  °  ° ° °  °AM-PAC PT "6 Clicks" Mobility  °Outcome Measure Help needed turning from your back to your side while in a flat bed without using bedrails?: None °Help needed moving from lying on your back to sitting on the side of a flat bed without using bedrails?: None °Help needed moving to and from a bed to a chair (including a wheelchair)?: None °Help needed standing up from a chair using your arms (e.g., wheelchair or bedside chair)?: None °Help needed to walk in hospital room?: None °Help needed climbing 3-5 steps with a railing? : A Little °6 Click Score: 23 ° °  °End of Session Equipment Utilized  During Treatment: Gait belt °Activity Tolerance: Patient tolerated treatment well °Patient left: in chair;with call bell/phone within reach;with chair alarm set °Nurse Communication: Mobility status °PT Visit Diagnosis: Difficulty in walking, not elsewhere classified (R26.2);Pain;Muscle weakness (generalized) (M62.81) °Pain - Right/Left: Right °Pain - part of body: Knee °  ° °Time: 1030-1117 °PT Time Calculation (min) (ACUTE ONLY): 47 min ° ° °Charges:   PT Evaluation °$PT Eval Moderate Complexity: 1 Mod °PT Treatments °$Gait Training: 8-22 mins °$Therapeutic Exercise: 8-22 mins °  °   ° ° °Uhlenberg, Jennifer Kistler PT 02/19/2021  °Acute Rehabilitation Services °Pager [phone] °Office [phone] ° ° °

## 2021-02-19 NOTE — Progress Notes (Signed)
° °  Subjective: 1 Day Post-Op Procedure(s) (LRB): TOTAL KNEE ARTHROPLASTY (Right) Patient reports pain as mild.   Patient seen in rounds with Dr. Alvan Dame. Patient is well, and has had no acute complaints or problems. No acute events overnight. Foley catheter removed. Patient up at [facility] on exam.  We will start therapy today.   Objective: Vital signs in last 24 hours: Temp:  [97.6 F (36.4 C)-98.6 F (37 C)] 98 F (36.7 C) (02/14 0604) Pulse Rate:  [59-81] 79 (02/14 0604) Resp:  [12-27] 17 (02/14 0604) BP: (107-131)/(65-84) 107/65 (02/14 0604) SpO2:  [93 %-100 %] 98 % (02/14 0604) Weight:  [62.6 kg] 62.6 kg (02/13 1023)  Intake/Output from previous day:  Intake/Output Summary (Last 24 hours) at 02/19/2021 0716 Last data filed at 02/19/2021 0332 Gross per 24 hour  Intake 2802.94 ml  Output 875 ml  Net 1927.94 ml     Intake/Output this shift: No intake/output data recorded.  Labs: Recent Labs    02/19/21 0341  HGB 11.7*   Recent Labs    02/19/21 0341  WBC 11.9*  RBC 3.67*  HCT 33.9*  PLT 189   Recent Labs    02/19/21 0341  NA 134*  K 4.1  CL 105  CO2 25  BUN 11  CREATININE 0.52  GLUCOSE 143*  CALCIUM 8.1*   No results for input(s): LABPT, INR in the last 72 hours.  Exam: General - Patient is Alert and Oriented Extremity - Neurologically intact Sensation intact distally Intact pulses distally Dorsiflexion/Plantar flexion intact Dressing - dressing C/D/I Motor Function - intact, moving foot and toes well on exam.   Past Medical History:  Diagnosis Date   Arthritis    osteoarthritis; PAIN RIGHT HIP   PONV (postoperative nausea and vomiting)     Assessment/Plan: 1 Day Post-Op Procedure(s) (LRB): TOTAL KNEE ARTHROPLASTY (Right) Principal Problem:   S/P total knee arthroplasty, right  Estimated body mass index is 23.69 kg/m as calculated from the following:   Height as of this encounter: 5\' 4"  (1.626 m).   Weight as of this encounter: 62.6  kg. Advance diet Up with therapy D/C IV fluids   Patient's anticipated LOS is less than 2 midnights, meeting these requirements: - Younger than 91 - Lives within 1 hour of care - Has a competent adult at home to recover with post-op recover - NO history of  - Chronic pain requiring opiods  - Diabetes  - Coronary Artery Disease  - Heart failure  - Heart attack  - Stroke  - DVT/VTE  - Cardiac arrhythmia  - Respiratory Failure/COPD  - Renal failure  - Anemia  - Advanced Liver disease     DVT Prophylaxis - Aspirin Weight bearing as tolerated.  Plan is to go Home after hospital stay. Plan for discharge today following 1-2 sessions of PT as long as they are meeting their goals. Patient is scheduled for OPPT. Follow up in the office in 2 weeks.   Griffith Citron, PA-C Orthopedic Surgery [phone] 02/19/2021, 7:16 AM

## 2021-02-20 DIAGNOSIS — Z7982 Long term (current) use of aspirin: Secondary | ICD-10-CM | POA: Diagnosis not present

## 2021-02-20 DIAGNOSIS — Z471 Aftercare following joint replacement surgery: Secondary | ICD-10-CM | POA: Diagnosis not present

## 2021-02-20 DIAGNOSIS — M25661 Stiffness of right knee, not elsewhere classified: Secondary | ICD-10-CM | POA: Diagnosis not present

## 2021-02-20 DIAGNOSIS — R262 Difficulty in walking, not elsewhere classified: Secondary | ICD-10-CM | POA: Diagnosis not present

## 2021-02-20 DIAGNOSIS — Z79899 Other long term (current) drug therapy: Secondary | ICD-10-CM | POA: Diagnosis not present

## 2021-02-20 DIAGNOSIS — M25561 Pain in right knee: Secondary | ICD-10-CM | POA: Diagnosis not present

## 2021-02-20 DIAGNOSIS — Z792 Long term (current) use of antibiotics: Secondary | ICD-10-CM | POA: Diagnosis not present

## 2021-02-20 DIAGNOSIS — I890 Lymphedema, not elsewhere classified: Secondary | ICD-10-CM | POA: Diagnosis not present

## 2021-02-20 DIAGNOSIS — Z6825 Body mass index (BMI) 25.0-25.9, adult: Secondary | ICD-10-CM | POA: Diagnosis not present

## 2021-02-20 DIAGNOSIS — M1711 Unilateral primary osteoarthritis, right knee: Secondary | ICD-10-CM | POA: Diagnosis not present

## 2021-02-20 DIAGNOSIS — Z96651 Presence of right artificial knee joint: Secondary | ICD-10-CM | POA: Diagnosis not present

## 2021-02-21 DIAGNOSIS — M1711 Unilateral primary osteoarthritis, right knee: Secondary | ICD-10-CM | POA: Diagnosis not present

## 2021-02-21 DIAGNOSIS — R262 Difficulty in walking, not elsewhere classified: Secondary | ICD-10-CM | POA: Diagnosis not present

## 2021-02-21 DIAGNOSIS — M25661 Stiffness of right knee, not elsewhere classified: Secondary | ICD-10-CM | POA: Diagnosis not present

## 2021-02-21 DIAGNOSIS — Z471 Aftercare following joint replacement surgery: Secondary | ICD-10-CM | POA: Diagnosis not present

## 2021-02-21 DIAGNOSIS — M25561 Pain in right knee: Secondary | ICD-10-CM | POA: Diagnosis not present

## 2021-02-21 NOTE — Discharge Summary (Signed)
Patient ID: Tara Fernandez MRN: [id number] DOB/AGE: 70-Feb-1952 70 y.o.  Admit date: 02/18/2021 Discharge date: 02/19/2021  Admission Diagnoses:  Right knee osteoarthritis  Discharge Diagnoses:  Principal Problem:   S/P total knee arthroplasty, right   Past Medical History:  Diagnosis Date   Arthritis    osteoarthritis; PAIN RIGHT HIP   PONV (postoperative nausea and vomiting)     Surgeries: Procedure(s): TOTAL KNEE ARTHROPLASTY on 02/18/2021   Consultants:   Discharged Condition: Improved  Hospital Course: Tara Fernandez is an 71 y.o. female who was admitted 02/18/2021 for operative treatment ofS/P total knee arthroplasty, right. Patient has severe unremitting pain that affects sleep, daily activities, and work/hobbies. After pre-op clearance the patient was taken to the operating room on 02/18/2021 and underwent  Procedure(s): TOTAL KNEE ARTHROPLASTY.    Patient was given perioperative antibiotics:  Anti-infectives (From admission, onward)    Start     Dose/Rate Route Frequency Ordered Stop   02/18/21 2000  ceFAZolin (ANCEF) IVPB 2g/100 mL premix        2 g 200 mL/hr over 30 Minutes Intravenous Every 6 hours 02/18/21 1725 02/19/21 0153   02/18/21 1030  ceFAZolin (ANCEF) IVPB 2g/100 mL premix        2 g 200 mL/hr over 30 Minutes Intravenous On call to O.R. 02/18/21 1019 02/18/21 1441        Patient was given sequential compression devices, early ambulation, and chemoprophylaxis to prevent DVT. Patient worked with PT and was meeting their goals regarding safe ambulation and transfers.  Patient benefited maximally from hospital stay and there were no complications.    Recent vital signs: No data found.   Recent laboratory studies:  Recent Labs    02/19/21 0341  WBC 11.9*  HGB 11.7*  HCT 33.9*  PLT 189  NA 134*  K 4.1  CL 105  CO2 25  BUN 11  CREATININE 0.52  GLUCOSE 143*  CALCIUM 8.1*     Discharge Medications:   Allergies as of 02/19/2021        Reactions   Tramadol Other (See Comments)   Causes numbness in hands and right side of body   Bactrim [sulfamethoxazole-trimethoprim]    Pt unsure of reaction    Chlorhexidine Itching   Other    PT STATES SHE CAN TAKE HYDROCODONE/ACETAMINOPHEN FOR PAIN  WITH OUT ANY PROBLEMS.  STATES - IN THE PAST - OTHER PAIN MEDS CAUSED NAUSEA OR RASH OR FAST HEART BEAT - BUT SHE DOESN'T KNOW NAMES OF THOSE MEDS   STATES NO PROBLEMS WITH ANY PAIN MEDS GIVEN IN HOSPITAL.   Premarin [conjugated Estrogens] Other (See Comments)   Headache, dizziness, and pelvic cramping.   Zithromax [azithromycin] Swelling   Area around eyes swollen and red   Ciprofloxacin Hives, Itching, Rash   "joint pain", patient reports hives, rash, and itching   Latex Rash   HIVES - AFTER DENTIST USED LATEX GLOVES        Medication List     STOP taking these medications    ibuprofen 200 MG tablet Commonly known as: ADVIL       TAKE these medications    aspirin 81 MG chewable tablet Chew 1 tablet (81 mg total) by mouth 2 (two) times daily for 28 days.   B-complex with vitamin C tablet Take 1 tablet by mouth daily. Notes to patient: Resume home regimen   BEE POLLEN/ROYAL JELLY/HONEY PO Take 1 capsule by mouth daily. Notes to patient: Resume home regimen  BOSWELLIA PO Take 1 capsule by mouth in the morning and at bedtime. Notes to patient: Resume home regimen   docusate sodium 100 MG capsule Commonly known as: COLACE Take 1 capsule (100 mg total) by mouth 2 (two) times daily.   ELDERBERRY PO Take 1 capsule by mouth daily. Notes to patient: Resume home regimen   methocarbamol 500 MG tablet Commonly known as: ROBAXIN Take 1 tablet (500 mg total) by mouth every 6 (six) hours as needed for muscle spasms. Notes to patient: Last dose given 02/14 06:41pm   oxyCODONE 5 MG immediate release tablet Commonly known as: Roxicodone Take 1-2 tablets (5-10 mg total) by mouth every 4 (four) hours as needed. Notes to  patient: Last dose given 02/14 06:40am   polyethylene glycol 17 g packet Commonly known as: MIRALAX / GLYCOLAX Take 17 g by mouth daily as needed for mild constipation.   RED CLOVER LEAF EXTRACT PO Take 1 capsule by mouth daily. Notes to patient: Resume home regimen   SAM-E PO Take 1 capsule by mouth daily. Notes to patient: Resume home regimen   TURMERIC-GINGER PO Take 1 capsule by mouth daily. Notes to patient: Resume home regimen   vitamin C 1000 MG tablet Take 1,000 mg by mouth daily. Notes to patient: Resume home regimen               Discharge Care Instructions  (From admission, onward)           Start     Ordered   02/19/21 0000  Change dressing       Comments: Maintain surgical dressing until follow up in the clinic. If the edges start to pull up, may reinforce with tape. If the dressing is no longer working, may remove and cover with gauze and tape, but must keep the area dry and clean.  Call with any questions or concerns.   02/19/21 0721            Diagnostic Studies: No results found.  Disposition: Discharge disposition: 01-Home or Self Care       Discharge Instructions     Call MD / Call 911   Complete by: As directed    If you experience chest pain or shortness of breath, CALL 911 and be transported to the hospital emergency room.  If you develope a fever above 101 F, pus (white drainage) or increased drainage or redness at the wound, or calf pain, call your surgeon's office.   Change dressing   Complete by: As directed    Maintain surgical dressing until follow up in the clinic. If the edges start to pull up, may reinforce with tape. If the dressing is no longer working, may remove and cover with gauze and tape, but must keep the area dry and clean.  Call with any questions or concerns.   Constipation Prevention   Complete by: As directed    Drink plenty of fluids.  Prune juice may be helpful.  You may use a stool softener, such as  Colace (over the counter) 100 mg twice a day.  Use MiraLax (over the counter) for constipation as needed.   Diet - low sodium heart healthy   Complete by: As directed    Increase activity slowly as tolerated   Complete by: As directed    Weight bearing as tolerated with assist device (walker, cane, etc) as directed, use it as long as suggested by your surgeon or therapist, typically at least 4-6 weeks.   Post-operative  opioid taper instructions:   Complete by: As directed    POST-OPERATIVE OPIOID TAPER INSTRUCTIONS: It is important to wean off of your opioid medication as soon as possible. If you do not need pain medication after your surgery it is ok to stop day one. Opioids include: Codeine, Hydrocodone(Norco, Vicodin), Oxycodone(Percocet, oxycontin) and hydromorphone amongst others.  Long term and even short term use of opiods can cause: Increased pain response Dependence Constipation Depression Respiratory depression And more.  Withdrawal symptoms can include Flu like symptoms Nausea, vomiting And more Techniques to manage these symptoms Hydrate well Eat regular healthy meals Stay active Use relaxation techniques(deep breathing, meditating, yoga) Do Not substitute Alcohol to help with tapering If you have been on opioids for less than two weeks and do not have pain than it is ok to stop all together.  Plan to wean off of opioids This plan should start within one week post op of your joint replacement. Maintain the same interval or time between taking each dose and first decrease the dose.  Cut the total daily intake of opioids by one tablet each day Next start to increase the time between doses. The last dose that should be eliminated is the evening dose.      TED hose   Complete by: As directed    Use stockings (TED hose) for 2 weeks on both leg(s).  You may remove them at night for sleeping.        Follow-up Information     Paralee Cancel, MD. Go on 03/06/2021.    Specialty: Orthopedic Surgery Why: You are scheduled for first post op appointment on Wednesday March 1st at 2:00pm. Contact information: [address] [place] [address] [phone]                  Signed: Irving Copas 02/21/2021, 4:01 PM

## 2021-02-22 DIAGNOSIS — M25561 Pain in right knee: Secondary | ICD-10-CM | POA: Diagnosis not present

## 2021-02-22 DIAGNOSIS — Z471 Aftercare following joint replacement surgery: Secondary | ICD-10-CM | POA: Diagnosis not present

## 2021-02-22 DIAGNOSIS — M25611 Stiffness of right shoulder, not elsewhere classified: Secondary | ICD-10-CM | POA: Diagnosis not present

## 2021-02-22 DIAGNOSIS — M7581 Other shoulder lesions, right shoulder: Secondary | ICD-10-CM | POA: Diagnosis not present

## 2021-02-22 DIAGNOSIS — R262 Difficulty in walking, not elsewhere classified: Secondary | ICD-10-CM | POA: Diagnosis not present

## 2021-02-22 DIAGNOSIS — M1711 Unilateral primary osteoarthritis, right knee: Secondary | ICD-10-CM | POA: Diagnosis not present

## 2021-02-22 DIAGNOSIS — M25661 Stiffness of right knee, not elsewhere classified: Secondary | ICD-10-CM | POA: Diagnosis not present

## 2021-02-22 DIAGNOSIS — M25511 Pain in right shoulder: Secondary | ICD-10-CM | POA: Diagnosis not present

## 2021-02-25 DIAGNOSIS — M25561 Pain in right knee: Secondary | ICD-10-CM | POA: Diagnosis not present

## 2021-02-25 DIAGNOSIS — R262 Difficulty in walking, not elsewhere classified: Secondary | ICD-10-CM | POA: Diagnosis not present

## 2021-02-25 DIAGNOSIS — M1711 Unilateral primary osteoarthritis, right knee: Secondary | ICD-10-CM | POA: Diagnosis not present

## 2021-02-25 DIAGNOSIS — M25661 Stiffness of right knee, not elsewhere classified: Secondary | ICD-10-CM | POA: Diagnosis not present

## 2021-02-25 DIAGNOSIS — Z471 Aftercare following joint replacement surgery: Secondary | ICD-10-CM | POA: Diagnosis not present

## 2021-02-26 DIAGNOSIS — M1711 Unilateral primary osteoarthritis, right knee: Secondary | ICD-10-CM | POA: Diagnosis not present

## 2021-02-26 DIAGNOSIS — R262 Difficulty in walking, not elsewhere classified: Secondary | ICD-10-CM | POA: Diagnosis not present

## 2021-02-26 DIAGNOSIS — M25661 Stiffness of right knee, not elsewhere classified: Secondary | ICD-10-CM | POA: Diagnosis not present

## 2021-02-26 DIAGNOSIS — Z471 Aftercare following joint replacement surgery: Secondary | ICD-10-CM | POA: Diagnosis not present

## 2021-02-26 DIAGNOSIS — M25561 Pain in right knee: Secondary | ICD-10-CM | POA: Diagnosis not present

## 2021-02-27 DIAGNOSIS — R262 Difficulty in walking, not elsewhere classified: Secondary | ICD-10-CM | POA: Diagnosis not present

## 2021-02-27 DIAGNOSIS — M1711 Unilateral primary osteoarthritis, right knee: Secondary | ICD-10-CM | POA: Diagnosis not present

## 2021-02-27 DIAGNOSIS — M25661 Stiffness of right knee, not elsewhere classified: Secondary | ICD-10-CM | POA: Diagnosis not present

## 2021-02-27 DIAGNOSIS — Z471 Aftercare following joint replacement surgery: Secondary | ICD-10-CM | POA: Diagnosis not present

## 2021-02-27 DIAGNOSIS — M25561 Pain in right knee: Secondary | ICD-10-CM | POA: Diagnosis not present

## 2021-03-01 DIAGNOSIS — M25611 Stiffness of right shoulder, not elsewhere classified: Secondary | ICD-10-CM | POA: Diagnosis not present

## 2021-03-01 DIAGNOSIS — M25511 Pain in right shoulder: Secondary | ICD-10-CM | POA: Diagnosis not present

## 2021-03-01 DIAGNOSIS — M7581 Other shoulder lesions, right shoulder: Secondary | ICD-10-CM | POA: Diagnosis not present

## 2021-03-04 DIAGNOSIS — M25611 Stiffness of right shoulder, not elsewhere classified: Secondary | ICD-10-CM | POA: Diagnosis not present

## 2021-03-04 DIAGNOSIS — Z471 Aftercare following joint replacement surgery: Secondary | ICD-10-CM | POA: Diagnosis not present

## 2021-03-04 DIAGNOSIS — M1711 Unilateral primary osteoarthritis, right knee: Secondary | ICD-10-CM | POA: Diagnosis not present

## 2021-03-04 DIAGNOSIS — M7581 Other shoulder lesions, right shoulder: Secondary | ICD-10-CM | POA: Diagnosis not present

## 2021-03-04 DIAGNOSIS — R262 Difficulty in walking, not elsewhere classified: Secondary | ICD-10-CM | POA: Diagnosis not present

## 2021-03-04 DIAGNOSIS — M25511 Pain in right shoulder: Secondary | ICD-10-CM | POA: Diagnosis not present

## 2021-03-04 DIAGNOSIS — M25661 Stiffness of right knee, not elsewhere classified: Secondary | ICD-10-CM | POA: Diagnosis not present

## 2021-03-04 DIAGNOSIS — M25561 Pain in right knee: Secondary | ICD-10-CM | POA: Diagnosis not present

## 2021-03-05 DIAGNOSIS — M25561 Pain in right knee: Secondary | ICD-10-CM | POA: Diagnosis not present

## 2021-03-05 DIAGNOSIS — M25661 Stiffness of right knee, not elsewhere classified: Secondary | ICD-10-CM | POA: Diagnosis not present

## 2021-03-05 DIAGNOSIS — Z471 Aftercare following joint replacement surgery: Secondary | ICD-10-CM | POA: Diagnosis not present

## 2021-03-05 DIAGNOSIS — M1711 Unilateral primary osteoarthritis, right knee: Secondary | ICD-10-CM | POA: Diagnosis not present

## 2021-03-05 DIAGNOSIS — R262 Difficulty in walking, not elsewhere classified: Secondary | ICD-10-CM | POA: Diagnosis not present

## 2021-03-07 DIAGNOSIS — M25661 Stiffness of right knee, not elsewhere classified: Secondary | ICD-10-CM | POA: Diagnosis not present

## 2021-03-07 DIAGNOSIS — M1711 Unilateral primary osteoarthritis, right knee: Secondary | ICD-10-CM | POA: Diagnosis not present

## 2021-03-07 DIAGNOSIS — R262 Difficulty in walking, not elsewhere classified: Secondary | ICD-10-CM | POA: Diagnosis not present

## 2021-03-07 DIAGNOSIS — Z471 Aftercare following joint replacement surgery: Secondary | ICD-10-CM | POA: Diagnosis not present

## 2021-03-07 DIAGNOSIS — M25561 Pain in right knee: Secondary | ICD-10-CM | POA: Diagnosis not present

## 2021-03-11 DIAGNOSIS — M25511 Pain in right shoulder: Secondary | ICD-10-CM | POA: Diagnosis not present

## 2021-03-11 DIAGNOSIS — M7581 Other shoulder lesions, right shoulder: Secondary | ICD-10-CM | POA: Diagnosis not present

## 2021-03-11 DIAGNOSIS — M25611 Stiffness of right shoulder, not elsewhere classified: Secondary | ICD-10-CM | POA: Diagnosis not present

## 2021-03-13 DIAGNOSIS — M25511 Pain in right shoulder: Secondary | ICD-10-CM | POA: Diagnosis not present

## 2021-03-13 DIAGNOSIS — M25611 Stiffness of right shoulder, not elsewhere classified: Secondary | ICD-10-CM | POA: Diagnosis not present

## 2021-03-13 DIAGNOSIS — M7581 Other shoulder lesions, right shoulder: Secondary | ICD-10-CM | POA: Diagnosis not present

## 2021-03-14 DIAGNOSIS — M1711 Unilateral primary osteoarthritis, right knee: Secondary | ICD-10-CM | POA: Diagnosis not present

## 2021-03-14 DIAGNOSIS — M25611 Stiffness of right shoulder, not elsewhere classified: Secondary | ICD-10-CM | POA: Diagnosis not present

## 2021-03-14 DIAGNOSIS — M25561 Pain in right knee: Secondary | ICD-10-CM | POA: Diagnosis not present

## 2021-03-14 DIAGNOSIS — M25511 Pain in right shoulder: Secondary | ICD-10-CM | POA: Diagnosis not present

## 2021-03-14 DIAGNOSIS — M25661 Stiffness of right knee, not elsewhere classified: Secondary | ICD-10-CM | POA: Diagnosis not present

## 2021-03-14 DIAGNOSIS — Z471 Aftercare following joint replacement surgery: Secondary | ICD-10-CM | POA: Diagnosis not present

## 2021-03-14 DIAGNOSIS — R262 Difficulty in walking, not elsewhere classified: Secondary | ICD-10-CM | POA: Diagnosis not present

## 2021-03-14 DIAGNOSIS — M7581 Other shoulder lesions, right shoulder: Secondary | ICD-10-CM | POA: Diagnosis not present

## 2021-03-18 DIAGNOSIS — M9903 Segmental and somatic dysfunction of lumbar region: Secondary | ICD-10-CM | POA: Diagnosis not present

## 2021-03-18 DIAGNOSIS — M5136 Other intervertebral disc degeneration, lumbar region: Secondary | ICD-10-CM | POA: Diagnosis not present

## 2021-03-18 DIAGNOSIS — M5442 Lumbago with sciatica, left side: Secondary | ICD-10-CM | POA: Diagnosis not present

## 2021-03-18 DIAGNOSIS — M47816 Spondylosis without myelopathy or radiculopathy, lumbar region: Secondary | ICD-10-CM | POA: Diagnosis not present

## 2021-03-19 DIAGNOSIS — M25611 Stiffness of right shoulder, not elsewhere classified: Secondary | ICD-10-CM | POA: Diagnosis not present

## 2021-03-19 DIAGNOSIS — M7581 Other shoulder lesions, right shoulder: Secondary | ICD-10-CM | POA: Diagnosis not present

## 2021-03-19 DIAGNOSIS — M25511 Pain in right shoulder: Secondary | ICD-10-CM | POA: Diagnosis not present

## 2021-03-20 DIAGNOSIS — M5136 Other intervertebral disc degeneration, lumbar region: Secondary | ICD-10-CM | POA: Diagnosis not present

## 2021-03-20 DIAGNOSIS — M5442 Lumbago with sciatica, left side: Secondary | ICD-10-CM | POA: Diagnosis not present

## 2021-03-20 DIAGNOSIS — M47816 Spondylosis without myelopathy or radiculopathy, lumbar region: Secondary | ICD-10-CM | POA: Diagnosis not present

## 2021-03-20 DIAGNOSIS — M9903 Segmental and somatic dysfunction of lumbar region: Secondary | ICD-10-CM | POA: Diagnosis not present

## 2021-03-21 DIAGNOSIS — M25561 Pain in right knee: Secondary | ICD-10-CM | POA: Diagnosis not present

## 2021-03-21 DIAGNOSIS — M1711 Unilateral primary osteoarthritis, right knee: Secondary | ICD-10-CM | POA: Diagnosis not present

## 2021-03-21 DIAGNOSIS — R262 Difficulty in walking, not elsewhere classified: Secondary | ICD-10-CM | POA: Diagnosis not present

## 2021-03-21 DIAGNOSIS — Z471 Aftercare following joint replacement surgery: Secondary | ICD-10-CM | POA: Diagnosis not present

## 2021-03-21 DIAGNOSIS — M25661 Stiffness of right knee, not elsewhere classified: Secondary | ICD-10-CM | POA: Diagnosis not present

## 2021-03-22 DIAGNOSIS — Z471 Aftercare following joint replacement surgery: Secondary | ICD-10-CM | POA: Diagnosis not present

## 2021-03-22 DIAGNOSIS — M25561 Pain in right knee: Secondary | ICD-10-CM | POA: Diagnosis not present

## 2021-03-22 DIAGNOSIS — M25661 Stiffness of right knee, not elsewhere classified: Secondary | ICD-10-CM | POA: Diagnosis not present

## 2021-03-22 DIAGNOSIS — R262 Difficulty in walking, not elsewhere classified: Secondary | ICD-10-CM | POA: Diagnosis not present

## 2021-03-22 DIAGNOSIS — M1711 Unilateral primary osteoarthritis, right knee: Secondary | ICD-10-CM | POA: Diagnosis not present

## 2021-03-25 DIAGNOSIS — M25561 Pain in right knee: Secondary | ICD-10-CM | POA: Diagnosis not present

## 2021-03-25 DIAGNOSIS — Z471 Aftercare following joint replacement surgery: Secondary | ICD-10-CM | POA: Diagnosis not present

## 2021-03-25 DIAGNOSIS — M25661 Stiffness of right knee, not elsewhere classified: Secondary | ICD-10-CM | POA: Diagnosis not present

## 2021-03-25 DIAGNOSIS — M1711 Unilateral primary osteoarthritis, right knee: Secondary | ICD-10-CM | POA: Diagnosis not present

## 2021-03-25 DIAGNOSIS — R262 Difficulty in walking, not elsewhere classified: Secondary | ICD-10-CM | POA: Diagnosis not present

## 2021-03-27 DIAGNOSIS — M1711 Unilateral primary osteoarthritis, right knee: Secondary | ICD-10-CM | POA: Diagnosis not present

## 2021-03-27 DIAGNOSIS — Z471 Aftercare following joint replacement surgery: Secondary | ICD-10-CM | POA: Diagnosis not present

## 2021-03-27 DIAGNOSIS — R262 Difficulty in walking, not elsewhere classified: Secondary | ICD-10-CM | POA: Diagnosis not present

## 2021-03-27 DIAGNOSIS — M25661 Stiffness of right knee, not elsewhere classified: Secondary | ICD-10-CM | POA: Diagnosis not present

## 2021-03-27 DIAGNOSIS — M25561 Pain in right knee: Secondary | ICD-10-CM | POA: Diagnosis not present

## 2021-03-29 DIAGNOSIS — M25511 Pain in right shoulder: Secondary | ICD-10-CM | POA: Diagnosis not present

## 2021-03-29 DIAGNOSIS — M7581 Other shoulder lesions, right shoulder: Secondary | ICD-10-CM | POA: Diagnosis not present

## 2021-03-29 DIAGNOSIS — R262 Difficulty in walking, not elsewhere classified: Secondary | ICD-10-CM | POA: Diagnosis not present

## 2021-03-29 DIAGNOSIS — Z471 Aftercare following joint replacement surgery: Secondary | ICD-10-CM | POA: Diagnosis not present

## 2021-03-29 DIAGNOSIS — M25561 Pain in right knee: Secondary | ICD-10-CM | POA: Diagnosis not present

## 2021-03-29 DIAGNOSIS — M25611 Stiffness of right shoulder, not elsewhere classified: Secondary | ICD-10-CM | POA: Diagnosis not present

## 2021-03-29 DIAGNOSIS — M25661 Stiffness of right knee, not elsewhere classified: Secondary | ICD-10-CM | POA: Diagnosis not present

## 2021-03-29 DIAGNOSIS — M1711 Unilateral primary osteoarthritis, right knee: Secondary | ICD-10-CM | POA: Diagnosis not present

## 2021-04-03 DIAGNOSIS — M1711 Unilateral primary osteoarthritis, right knee: Secondary | ICD-10-CM | POA: Diagnosis not present

## 2021-04-03 DIAGNOSIS — M25561 Pain in right knee: Secondary | ICD-10-CM | POA: Diagnosis not present

## 2021-04-03 DIAGNOSIS — M25661 Stiffness of right knee, not elsewhere classified: Secondary | ICD-10-CM | POA: Diagnosis not present

## 2021-04-03 DIAGNOSIS — R262 Difficulty in walking, not elsewhere classified: Secondary | ICD-10-CM | POA: Diagnosis not present

## 2021-04-03 DIAGNOSIS — Z471 Aftercare following joint replacement surgery: Secondary | ICD-10-CM | POA: Diagnosis not present

## 2021-04-04 DIAGNOSIS — M5442 Lumbago with sciatica, left side: Secondary | ICD-10-CM | POA: Diagnosis not present

## 2021-04-04 DIAGNOSIS — M9903 Segmental and somatic dysfunction of lumbar region: Secondary | ICD-10-CM | POA: Diagnosis not present

## 2021-04-04 DIAGNOSIS — M5136 Other intervertebral disc degeneration, lumbar region: Secondary | ICD-10-CM | POA: Diagnosis not present

## 2021-04-04 DIAGNOSIS — Z471 Aftercare following joint replacement surgery: Secondary | ICD-10-CM | POA: Diagnosis not present

## 2021-04-04 DIAGNOSIS — M47816 Spondylosis without myelopathy or radiculopathy, lumbar region: Secondary | ICD-10-CM | POA: Diagnosis not present

## 2021-04-04 DIAGNOSIS — Z4789 Encounter for other orthopedic aftercare: Secondary | ICD-10-CM | POA: Diagnosis not present

## 2021-04-05 DIAGNOSIS — M25661 Stiffness of right knee, not elsewhere classified: Secondary | ICD-10-CM | POA: Diagnosis not present

## 2021-04-05 DIAGNOSIS — R262 Difficulty in walking, not elsewhere classified: Secondary | ICD-10-CM | POA: Diagnosis not present

## 2021-04-05 DIAGNOSIS — Z471 Aftercare following joint replacement surgery: Secondary | ICD-10-CM | POA: Diagnosis not present

## 2021-04-05 DIAGNOSIS — M25561 Pain in right knee: Secondary | ICD-10-CM | POA: Diagnosis not present

## 2021-04-05 DIAGNOSIS — M1711 Unilateral primary osteoarthritis, right knee: Secondary | ICD-10-CM | POA: Diagnosis not present

## 2021-04-08 DIAGNOSIS — M25561 Pain in right knee: Secondary | ICD-10-CM | POA: Diagnosis not present

## 2021-04-08 DIAGNOSIS — M25661 Stiffness of right knee, not elsewhere classified: Secondary | ICD-10-CM | POA: Diagnosis not present

## 2021-04-08 DIAGNOSIS — M1711 Unilateral primary osteoarthritis, right knee: Secondary | ICD-10-CM | POA: Diagnosis not present

## 2021-04-08 DIAGNOSIS — R262 Difficulty in walking, not elsewhere classified: Secondary | ICD-10-CM | POA: Diagnosis not present

## 2021-04-08 DIAGNOSIS — Z471 Aftercare following joint replacement surgery: Secondary | ICD-10-CM | POA: Diagnosis not present

## 2021-04-12 DIAGNOSIS — Z471 Aftercare following joint replacement surgery: Secondary | ICD-10-CM | POA: Diagnosis not present

## 2021-04-12 DIAGNOSIS — M25561 Pain in right knee: Secondary | ICD-10-CM | POA: Diagnosis not present

## 2021-04-12 DIAGNOSIS — R262 Difficulty in walking, not elsewhere classified: Secondary | ICD-10-CM | POA: Diagnosis not present

## 2021-04-12 DIAGNOSIS — M25661 Stiffness of right knee, not elsewhere classified: Secondary | ICD-10-CM | POA: Diagnosis not present

## 2021-04-12 DIAGNOSIS — M1711 Unilateral primary osteoarthritis, right knee: Secondary | ICD-10-CM | POA: Diagnosis not present

## 2021-04-17 DIAGNOSIS — M25661 Stiffness of right knee, not elsewhere classified: Secondary | ICD-10-CM | POA: Diagnosis not present

## 2021-04-17 DIAGNOSIS — Z471 Aftercare following joint replacement surgery: Secondary | ICD-10-CM | POA: Diagnosis not present

## 2021-04-17 DIAGNOSIS — R262 Difficulty in walking, not elsewhere classified: Secondary | ICD-10-CM | POA: Diagnosis not present

## 2021-04-17 DIAGNOSIS — M1711 Unilateral primary osteoarthritis, right knee: Secondary | ICD-10-CM | POA: Diagnosis not present

## 2021-04-17 DIAGNOSIS — M25561 Pain in right knee: Secondary | ICD-10-CM | POA: Diagnosis not present

## 2021-04-19 DIAGNOSIS — M1711 Unilateral primary osteoarthritis, right knee: Secondary | ICD-10-CM | POA: Diagnosis not present

## 2021-04-19 DIAGNOSIS — M25561 Pain in right knee: Secondary | ICD-10-CM | POA: Diagnosis not present

## 2021-04-19 DIAGNOSIS — Z471 Aftercare following joint replacement surgery: Secondary | ICD-10-CM | POA: Diagnosis not present

## 2021-04-19 DIAGNOSIS — R262 Difficulty in walking, not elsewhere classified: Secondary | ICD-10-CM | POA: Diagnosis not present

## 2021-04-19 DIAGNOSIS — M25661 Stiffness of right knee, not elsewhere classified: Secondary | ICD-10-CM | POA: Diagnosis not present

## 2021-04-22 DIAGNOSIS — R262 Difficulty in walking, not elsewhere classified: Secondary | ICD-10-CM | POA: Diagnosis not present

## 2021-04-22 DIAGNOSIS — M25561 Pain in right knee: Secondary | ICD-10-CM | POA: Diagnosis not present

## 2021-04-22 DIAGNOSIS — Z471 Aftercare following joint replacement surgery: Secondary | ICD-10-CM | POA: Diagnosis not present

## 2021-04-22 DIAGNOSIS — M25661 Stiffness of right knee, not elsewhere classified: Secondary | ICD-10-CM | POA: Diagnosis not present

## 2021-04-22 DIAGNOSIS — M1711 Unilateral primary osteoarthritis, right knee: Secondary | ICD-10-CM | POA: Diagnosis not present

## 2021-04-24 DIAGNOSIS — M7581 Other shoulder lesions, right shoulder: Secondary | ICD-10-CM | POA: Diagnosis not present

## 2021-04-24 DIAGNOSIS — M25611 Stiffness of right shoulder, not elsewhere classified: Secondary | ICD-10-CM | POA: Diagnosis not present

## 2021-04-24 DIAGNOSIS — R262 Difficulty in walking, not elsewhere classified: Secondary | ICD-10-CM | POA: Diagnosis not present

## 2021-04-24 DIAGNOSIS — M25561 Pain in right knee: Secondary | ICD-10-CM | POA: Diagnosis not present

## 2021-04-24 DIAGNOSIS — Z471 Aftercare following joint replacement surgery: Secondary | ICD-10-CM | POA: Diagnosis not present

## 2021-04-24 DIAGNOSIS — M1711 Unilateral primary osteoarthritis, right knee: Secondary | ICD-10-CM | POA: Diagnosis not present

## 2021-04-24 DIAGNOSIS — M25661 Stiffness of right knee, not elsewhere classified: Secondary | ICD-10-CM | POA: Diagnosis not present

## 2021-04-24 DIAGNOSIS — M25511 Pain in right shoulder: Secondary | ICD-10-CM | POA: Diagnosis not present

## 2021-04-26 DIAGNOSIS — R262 Difficulty in walking, not elsewhere classified: Secondary | ICD-10-CM | POA: Diagnosis not present

## 2021-04-26 DIAGNOSIS — M7581 Other shoulder lesions, right shoulder: Secondary | ICD-10-CM | POA: Diagnosis not present

## 2021-04-26 DIAGNOSIS — Z471 Aftercare following joint replacement surgery: Secondary | ICD-10-CM | POA: Diagnosis not present

## 2021-04-26 DIAGNOSIS — M25511 Pain in right shoulder: Secondary | ICD-10-CM | POA: Diagnosis not present

## 2021-04-26 DIAGNOSIS — M1711 Unilateral primary osteoarthritis, right knee: Secondary | ICD-10-CM | POA: Diagnosis not present

## 2021-04-26 DIAGNOSIS — M25611 Stiffness of right shoulder, not elsewhere classified: Secondary | ICD-10-CM | POA: Diagnosis not present

## 2021-04-26 DIAGNOSIS — M25561 Pain in right knee: Secondary | ICD-10-CM | POA: Diagnosis not present

## 2021-04-26 DIAGNOSIS — M25661 Stiffness of right knee, not elsewhere classified: Secondary | ICD-10-CM | POA: Diagnosis not present

## 2021-04-29 DIAGNOSIS — M25561 Pain in right knee: Secondary | ICD-10-CM | POA: Diagnosis not present

## 2021-04-29 DIAGNOSIS — M1711 Unilateral primary osteoarthritis, right knee: Secondary | ICD-10-CM | POA: Diagnosis not present

## 2021-04-29 DIAGNOSIS — Z471 Aftercare following joint replacement surgery: Secondary | ICD-10-CM | POA: Diagnosis not present

## 2021-04-29 DIAGNOSIS — M25661 Stiffness of right knee, not elsewhere classified: Secondary | ICD-10-CM | POA: Diagnosis not present

## 2021-04-29 DIAGNOSIS — R262 Difficulty in walking, not elsewhere classified: Secondary | ICD-10-CM | POA: Diagnosis not present

## 2021-04-30 DIAGNOSIS — M47816 Spondylosis without myelopathy or radiculopathy, lumbar region: Secondary | ICD-10-CM | POA: Diagnosis not present

## 2021-04-30 DIAGNOSIS — M5136 Other intervertebral disc degeneration, lumbar region: Secondary | ICD-10-CM | POA: Diagnosis not present

## 2021-04-30 DIAGNOSIS — M5442 Lumbago with sciatica, left side: Secondary | ICD-10-CM | POA: Diagnosis not present

## 2021-04-30 DIAGNOSIS — M9903 Segmental and somatic dysfunction of lumbar region: Secondary | ICD-10-CM | POA: Diagnosis not present

## 2021-05-01 DIAGNOSIS — M1711 Unilateral primary osteoarthritis, right knee: Secondary | ICD-10-CM | POA: Diagnosis not present

## 2021-05-01 DIAGNOSIS — M25561 Pain in right knee: Secondary | ICD-10-CM | POA: Diagnosis not present

## 2021-05-01 DIAGNOSIS — R262 Difficulty in walking, not elsewhere classified: Secondary | ICD-10-CM | POA: Diagnosis not present

## 2021-05-01 DIAGNOSIS — Z471 Aftercare following joint replacement surgery: Secondary | ICD-10-CM | POA: Diagnosis not present

## 2021-05-01 DIAGNOSIS — M25661 Stiffness of right knee, not elsewhere classified: Secondary | ICD-10-CM | POA: Diagnosis not present

## 2021-05-02 ENCOUNTER — Encounter: Payer: Self-pay | Admitting: Family Medicine

## 2021-05-02 DIAGNOSIS — Z1231 Encounter for screening mammogram for malignant neoplasm of breast: Secondary | ICD-10-CM | POA: Diagnosis not present

## 2021-05-03 DIAGNOSIS — R262 Difficulty in walking, not elsewhere classified: Secondary | ICD-10-CM | POA: Diagnosis not present

## 2021-05-03 DIAGNOSIS — Z471 Aftercare following joint replacement surgery: Secondary | ICD-10-CM | POA: Diagnosis not present

## 2021-05-03 DIAGNOSIS — M25561 Pain in right knee: Secondary | ICD-10-CM | POA: Diagnosis not present

## 2021-05-03 DIAGNOSIS — M25661 Stiffness of right knee, not elsewhere classified: Secondary | ICD-10-CM | POA: Diagnosis not present

## 2021-05-03 DIAGNOSIS — M1711 Unilateral primary osteoarthritis, right knee: Secondary | ICD-10-CM | POA: Diagnosis not present

## 2021-05-06 DIAGNOSIS — R262 Difficulty in walking, not elsewhere classified: Secondary | ICD-10-CM | POA: Diagnosis not present

## 2021-05-06 DIAGNOSIS — Z471 Aftercare following joint replacement surgery: Secondary | ICD-10-CM | POA: Diagnosis not present

## 2021-05-06 DIAGNOSIS — M1711 Unilateral primary osteoarthritis, right knee: Secondary | ICD-10-CM | POA: Diagnosis not present

## 2021-05-06 DIAGNOSIS — M25661 Stiffness of right knee, not elsewhere classified: Secondary | ICD-10-CM | POA: Diagnosis not present

## 2021-05-06 DIAGNOSIS — M25561 Pain in right knee: Secondary | ICD-10-CM | POA: Diagnosis not present

## 2021-05-08 DIAGNOSIS — M25661 Stiffness of right knee, not elsewhere classified: Secondary | ICD-10-CM | POA: Diagnosis not present

## 2021-05-08 DIAGNOSIS — R262 Difficulty in walking, not elsewhere classified: Secondary | ICD-10-CM | POA: Diagnosis not present

## 2021-05-08 DIAGNOSIS — M1711 Unilateral primary osteoarthritis, right knee: Secondary | ICD-10-CM | POA: Diagnosis not present

## 2021-05-08 DIAGNOSIS — Z471 Aftercare following joint replacement surgery: Secondary | ICD-10-CM | POA: Diagnosis not present

## 2021-05-08 DIAGNOSIS — M25561 Pain in right knee: Secondary | ICD-10-CM | POA: Diagnosis not present

## 2021-05-10 DIAGNOSIS — Z471 Aftercare following joint replacement surgery: Secondary | ICD-10-CM | POA: Diagnosis not present

## 2021-05-10 DIAGNOSIS — M1711 Unilateral primary osteoarthritis, right knee: Secondary | ICD-10-CM | POA: Diagnosis not present

## 2021-05-10 DIAGNOSIS — M25561 Pain in right knee: Secondary | ICD-10-CM | POA: Diagnosis not present

## 2021-05-10 DIAGNOSIS — R262 Difficulty in walking, not elsewhere classified: Secondary | ICD-10-CM | POA: Diagnosis not present

## 2021-05-10 DIAGNOSIS — M25661 Stiffness of right knee, not elsewhere classified: Secondary | ICD-10-CM | POA: Diagnosis not present

## 2021-05-14 DIAGNOSIS — M1711 Unilateral primary osteoarthritis, right knee: Secondary | ICD-10-CM | POA: Diagnosis not present

## 2021-05-14 DIAGNOSIS — Z471 Aftercare following joint replacement surgery: Secondary | ICD-10-CM | POA: Diagnosis not present

## 2021-05-14 DIAGNOSIS — M25561 Pain in right knee: Secondary | ICD-10-CM | POA: Diagnosis not present

## 2021-05-14 DIAGNOSIS — R262 Difficulty in walking, not elsewhere classified: Secondary | ICD-10-CM | POA: Diagnosis not present

## 2021-05-14 DIAGNOSIS — M9903 Segmental and somatic dysfunction of lumbar region: Secondary | ICD-10-CM | POA: Diagnosis not present

## 2021-05-14 DIAGNOSIS — M25661 Stiffness of right knee, not elsewhere classified: Secondary | ICD-10-CM | POA: Diagnosis not present

## 2021-05-14 DIAGNOSIS — M47816 Spondylosis without myelopathy or radiculopathy, lumbar region: Secondary | ICD-10-CM | POA: Diagnosis not present

## 2021-05-14 DIAGNOSIS — M5136 Other intervertebral disc degeneration, lumbar region: Secondary | ICD-10-CM | POA: Diagnosis not present

## 2021-05-14 DIAGNOSIS — M5442 Lumbago with sciatica, left side: Secondary | ICD-10-CM | POA: Diagnosis not present

## 2021-05-28 DIAGNOSIS — R262 Difficulty in walking, not elsewhere classified: Secondary | ICD-10-CM | POA: Diagnosis not present

## 2021-05-28 DIAGNOSIS — Z471 Aftercare following joint replacement surgery: Secondary | ICD-10-CM | POA: Diagnosis not present

## 2021-05-28 DIAGNOSIS — M1711 Unilateral primary osteoarthritis, right knee: Secondary | ICD-10-CM | POA: Diagnosis not present

## 2021-05-28 DIAGNOSIS — M25561 Pain in right knee: Secondary | ICD-10-CM | POA: Diagnosis not present

## 2021-05-28 DIAGNOSIS — M25661 Stiffness of right knee, not elsewhere classified: Secondary | ICD-10-CM | POA: Diagnosis not present

## 2021-05-31 DIAGNOSIS — M1711 Unilateral primary osteoarthritis, right knee: Secondary | ICD-10-CM | POA: Diagnosis not present

## 2021-05-31 DIAGNOSIS — R262 Difficulty in walking, not elsewhere classified: Secondary | ICD-10-CM | POA: Diagnosis not present

## 2021-05-31 DIAGNOSIS — Z471 Aftercare following joint replacement surgery: Secondary | ICD-10-CM | POA: Diagnosis not present

## 2021-05-31 DIAGNOSIS — M25561 Pain in right knee: Secondary | ICD-10-CM | POA: Diagnosis not present

## 2021-05-31 DIAGNOSIS — M25661 Stiffness of right knee, not elsewhere classified: Secondary | ICD-10-CM | POA: Diagnosis not present

## 2021-06-04 DIAGNOSIS — M1711 Unilateral primary osteoarthritis, right knee: Secondary | ICD-10-CM | POA: Diagnosis not present

## 2021-06-04 DIAGNOSIS — Z471 Aftercare following joint replacement surgery: Secondary | ICD-10-CM | POA: Diagnosis not present

## 2021-06-04 DIAGNOSIS — R262 Difficulty in walking, not elsewhere classified: Secondary | ICD-10-CM | POA: Diagnosis not present

## 2021-06-04 DIAGNOSIS — M25661 Stiffness of right knee, not elsewhere classified: Secondary | ICD-10-CM | POA: Diagnosis not present

## 2021-06-04 DIAGNOSIS — M25561 Pain in right knee: Secondary | ICD-10-CM | POA: Diagnosis not present

## 2021-06-06 DIAGNOSIS — M5442 Lumbago with sciatica, left side: Secondary | ICD-10-CM | POA: Diagnosis not present

## 2021-06-06 DIAGNOSIS — R262 Difficulty in walking, not elsewhere classified: Secondary | ICD-10-CM | POA: Diagnosis not present

## 2021-06-06 DIAGNOSIS — M5136 Other intervertebral disc degeneration, lumbar region: Secondary | ICD-10-CM | POA: Diagnosis not present

## 2021-06-06 DIAGNOSIS — M25561 Pain in right knee: Secondary | ICD-10-CM | POA: Diagnosis not present

## 2021-06-06 DIAGNOSIS — M9903 Segmental and somatic dysfunction of lumbar region: Secondary | ICD-10-CM | POA: Diagnosis not present

## 2021-06-06 DIAGNOSIS — M25661 Stiffness of right knee, not elsewhere classified: Secondary | ICD-10-CM | POA: Diagnosis not present

## 2021-06-06 DIAGNOSIS — M47816 Spondylosis without myelopathy or radiculopathy, lumbar region: Secondary | ICD-10-CM | POA: Diagnosis not present

## 2021-06-06 DIAGNOSIS — M1711 Unilateral primary osteoarthritis, right knee: Secondary | ICD-10-CM | POA: Diagnosis not present

## 2021-06-06 DIAGNOSIS — Z471 Aftercare following joint replacement surgery: Secondary | ICD-10-CM | POA: Diagnosis not present

## 2021-06-13 DIAGNOSIS — R262 Difficulty in walking, not elsewhere classified: Secondary | ICD-10-CM | POA: Diagnosis not present

## 2021-06-13 DIAGNOSIS — M25661 Stiffness of right knee, not elsewhere classified: Secondary | ICD-10-CM | POA: Diagnosis not present

## 2021-06-13 DIAGNOSIS — M1711 Unilateral primary osteoarthritis, right knee: Secondary | ICD-10-CM | POA: Diagnosis not present

## 2021-06-13 DIAGNOSIS — M9903 Segmental and somatic dysfunction of lumbar region: Secondary | ICD-10-CM | POA: Diagnosis not present

## 2021-06-13 DIAGNOSIS — M25511 Pain in right shoulder: Secondary | ICD-10-CM | POA: Diagnosis not present

## 2021-06-13 DIAGNOSIS — M25611 Stiffness of right shoulder, not elsewhere classified: Secondary | ICD-10-CM | POA: Diagnosis not present

## 2021-06-13 DIAGNOSIS — M5442 Lumbago with sciatica, left side: Secondary | ICD-10-CM | POA: Diagnosis not present

## 2021-06-13 DIAGNOSIS — Z471 Aftercare following joint replacement surgery: Secondary | ICD-10-CM | POA: Diagnosis not present

## 2021-06-13 DIAGNOSIS — M7581 Other shoulder lesions, right shoulder: Secondary | ICD-10-CM | POA: Diagnosis not present

## 2021-06-13 DIAGNOSIS — M25561 Pain in right knee: Secondary | ICD-10-CM | POA: Diagnosis not present

## 2021-06-13 DIAGNOSIS — M5136 Other intervertebral disc degeneration, lumbar region: Secondary | ICD-10-CM | POA: Diagnosis not present

## 2021-06-13 DIAGNOSIS — M47816 Spondylosis without myelopathy or radiculopathy, lumbar region: Secondary | ICD-10-CM | POA: Diagnosis not present

## 2021-06-18 DIAGNOSIS — M25611 Stiffness of right shoulder, not elsewhere classified: Secondary | ICD-10-CM | POA: Diagnosis not present

## 2021-06-18 DIAGNOSIS — M7581 Other shoulder lesions, right shoulder: Secondary | ICD-10-CM | POA: Diagnosis not present

## 2021-06-18 DIAGNOSIS — M25511 Pain in right shoulder: Secondary | ICD-10-CM | POA: Diagnosis not present

## 2021-06-25 DIAGNOSIS — M25661 Stiffness of right knee, not elsewhere classified: Secondary | ICD-10-CM | POA: Diagnosis not present

## 2021-06-25 DIAGNOSIS — R262 Difficulty in walking, not elsewhere classified: Secondary | ICD-10-CM | POA: Diagnosis not present

## 2021-06-25 DIAGNOSIS — M7581 Other shoulder lesions, right shoulder: Secondary | ICD-10-CM | POA: Diagnosis not present

## 2021-06-25 DIAGNOSIS — M25511 Pain in right shoulder: Secondary | ICD-10-CM | POA: Diagnosis not present

## 2021-06-25 DIAGNOSIS — M1711 Unilateral primary osteoarthritis, right knee: Secondary | ICD-10-CM | POA: Diagnosis not present

## 2021-06-25 DIAGNOSIS — Z471 Aftercare following joint replacement surgery: Secondary | ICD-10-CM | POA: Diagnosis not present

## 2021-06-25 DIAGNOSIS — M25561 Pain in right knee: Secondary | ICD-10-CM | POA: Diagnosis not present

## 2021-06-25 DIAGNOSIS — M25611 Stiffness of right shoulder, not elsewhere classified: Secondary | ICD-10-CM | POA: Diagnosis not present

## 2021-07-03 DIAGNOSIS — M25611 Stiffness of right shoulder, not elsewhere classified: Secondary | ICD-10-CM | POA: Diagnosis not present

## 2021-07-03 DIAGNOSIS — R262 Difficulty in walking, not elsewhere classified: Secondary | ICD-10-CM | POA: Diagnosis not present

## 2021-07-03 DIAGNOSIS — M25511 Pain in right shoulder: Secondary | ICD-10-CM | POA: Diagnosis not present

## 2021-07-03 DIAGNOSIS — M25661 Stiffness of right knee, not elsewhere classified: Secondary | ICD-10-CM | POA: Diagnosis not present

## 2021-07-03 DIAGNOSIS — M25561 Pain in right knee: Secondary | ICD-10-CM | POA: Diagnosis not present

## 2021-07-03 DIAGNOSIS — M1711 Unilateral primary osteoarthritis, right knee: Secondary | ICD-10-CM | POA: Diagnosis not present

## 2021-07-03 DIAGNOSIS — Z471 Aftercare following joint replacement surgery: Secondary | ICD-10-CM | POA: Diagnosis not present

## 2021-07-03 DIAGNOSIS — M7581 Other shoulder lesions, right shoulder: Secondary | ICD-10-CM | POA: Diagnosis not present

## 2021-07-04 DIAGNOSIS — M5442 Lumbago with sciatica, left side: Secondary | ICD-10-CM | POA: Diagnosis not present

## 2021-07-04 DIAGNOSIS — M5136 Other intervertebral disc degeneration, lumbar region: Secondary | ICD-10-CM | POA: Diagnosis not present

## 2021-07-04 DIAGNOSIS — M9903 Segmental and somatic dysfunction of lumbar region: Secondary | ICD-10-CM | POA: Diagnosis not present

## 2021-07-04 DIAGNOSIS — M47816 Spondylosis without myelopathy or radiculopathy, lumbar region: Secondary | ICD-10-CM | POA: Diagnosis not present

## 2021-07-11 DIAGNOSIS — M25661 Stiffness of right knee, not elsewhere classified: Secondary | ICD-10-CM | POA: Diagnosis not present

## 2021-07-11 DIAGNOSIS — Z471 Aftercare following joint replacement surgery: Secondary | ICD-10-CM | POA: Diagnosis not present

## 2021-07-11 DIAGNOSIS — M25561 Pain in right knee: Secondary | ICD-10-CM | POA: Diagnosis not present

## 2021-07-11 DIAGNOSIS — M1711 Unilateral primary osteoarthritis, right knee: Secondary | ICD-10-CM | POA: Diagnosis not present

## 2021-07-11 DIAGNOSIS — R262 Difficulty in walking, not elsewhere classified: Secondary | ICD-10-CM | POA: Diagnosis not present

## 2021-07-16 DIAGNOSIS — R262 Difficulty in walking, not elsewhere classified: Secondary | ICD-10-CM | POA: Diagnosis not present

## 2021-07-16 DIAGNOSIS — M5442 Lumbago with sciatica, left side: Secondary | ICD-10-CM | POA: Diagnosis not present

## 2021-07-16 DIAGNOSIS — M47816 Spondylosis without myelopathy or radiculopathy, lumbar region: Secondary | ICD-10-CM | POA: Diagnosis not present

## 2021-07-16 DIAGNOSIS — M1711 Unilateral primary osteoarthritis, right knee: Secondary | ICD-10-CM | POA: Diagnosis not present

## 2021-07-16 DIAGNOSIS — Z471 Aftercare following joint replacement surgery: Secondary | ICD-10-CM | POA: Diagnosis not present

## 2021-07-16 DIAGNOSIS — M5136 Other intervertebral disc degeneration, lumbar region: Secondary | ICD-10-CM | POA: Diagnosis not present

## 2021-07-16 DIAGNOSIS — M9903 Segmental and somatic dysfunction of lumbar region: Secondary | ICD-10-CM | POA: Diagnosis not present

## 2021-07-16 DIAGNOSIS — M25661 Stiffness of right knee, not elsewhere classified: Secondary | ICD-10-CM | POA: Diagnosis not present

## 2021-07-16 DIAGNOSIS — M25561 Pain in right knee: Secondary | ICD-10-CM | POA: Diagnosis not present

## 2021-07-23 DIAGNOSIS — M7581 Other shoulder lesions, right shoulder: Secondary | ICD-10-CM | POA: Diagnosis not present

## 2021-07-23 DIAGNOSIS — M25511 Pain in right shoulder: Secondary | ICD-10-CM | POA: Diagnosis not present

## 2021-07-23 DIAGNOSIS — M25611 Stiffness of right shoulder, not elsewhere classified: Secondary | ICD-10-CM | POA: Diagnosis not present

## 2021-07-26 ENCOUNTER — Encounter (HOSPITAL_BASED_OUTPATIENT_CLINIC_OR_DEPARTMENT_OTHER): Payer: Self-pay

## 2021-07-26 ENCOUNTER — Ambulatory Visit (INDEPENDENT_AMBULATORY_CARE_PROVIDER_SITE_OTHER): Payer: Medicare HMO

## 2021-07-26 DIAGNOSIS — Z0000 Encounter for general adult medical examination without abnormal findings: Secondary | ICD-10-CM | POA: Diagnosis not present

## 2021-07-26 NOTE — Progress Notes (Signed)
I connected with  Barbara Ahart on 07/26/21 by a audio enabled telemedicine application and verified that I am speaking with the correct person using two identifiers.  Patient Location: Home  Provider Location: Home Office  I discussed the limitations of evaluation and management by telemedicine. The patient expressed understanding and agreed to proceed.  Subjective:   Tara Fernandez is a 71 y.o. female who presents for Medicare Annual (Subsequent) preventive examination.      Objective:    There were no vitals filed for this visit. There is no height or weight on file to calculate BMI.     02/18/2021    5:39 PM 02/05/2021   10:26 AM 01/10/2021    1:11 PM 03/05/2015    2:00 PM 03/05/2015    6:11 AM 02/23/2015   10:08 AM 06/08/2013    2:12 PM  Advanced Directives  Does Patient Have a Medical Advance Directive? Yes Yes Yes No No No Patient does not have advance directive  Type of Advance Directive [place];Living will [place] ;Living will [place];Living will      Does patient want to make changes to medical advance directive? No - Patient declined        Copy of [place] in Chart? No - copy requested No - copy requested       Would patient like information on creating a medical advance directive?    No - patient declined information  No - patient declined information     Current Medications (verified) Outpatient Encounter Medications as of 07/26/2021  Medication Sig   Ascorbic Acid (VITAMIN C) 1000 MG tablet Take 1,000 mg by mouth daily.   B Complex-C (B-COMPLEX WITH VITAMIN C) tablet Take 1 tablet by mouth daily.   Boswellia Serrata (BOSWELLIA PO) Take 1 capsule by mouth in the morning and at bedtime.   docusate sodium (COLACE) 100 MG capsule Take 1 capsule (100 mg total) by mouth 2 (two) times daily.   ELDERBERRY PO Take 1 capsule by mouth daily.   methocarbamol (ROBAXIN) 500 MG tablet Take 1  tablet (500 mg total) by mouth every 6 (six) hours as needed for muscle spasms.   Nutritional Supplements (BEE POLLEN/ROYAL JELLY/HONEY PO) Take 1 capsule by mouth daily.   oxyCODONE (ROXICODONE) 5 MG immediate release tablet Take 1-2 tablets (5-10 mg total) by mouth every 4 (four) hours as needed.   polyethylene glycol (MIRALAX / GLYCOLAX) 17 g packet Take 17 g by mouth daily as needed for mild constipation.   RED CLOVER LEAF EXTRACT PO Take 1 capsule by mouth daily.   S-Adenosylmethionine (SAM-E PO) Take 1 capsule by mouth daily.   TURMERIC-GINGER PO Take 1 capsule by mouth daily.   No facility-administered encounter medications on file as of 07/26/2021.    Allergies (verified) Tramadol, Bactrim [sulfamethoxazole-trimethoprim], Chlorhexidine, Other, Premarin [conjugated estrogens], Zithromax [azithromycin], Ciprofloxacin, and Latex   History: Past Medical History:  Diagnosis Date   Arthritis    osteoarthritis; PAIN RIGHT HIP   PONV (postoperative nausea and vomiting)    Past Surgical History:  Procedure Laterality Date   ANTERIOR AND POSTERIOR REPAIR N/A 03/05/2015   Procedure: ANTERIOR (CYSTOCELE) REPAIR, VAULT PROLAPSE AND GRAFT;  Surgeon: Bjorn Loser, MD;  Location: [facility];  Service: Urology;  Laterality: N/A;   BREAST MASS EXCISION     Hx: of left breast   CARTILAGE SURGERY  10 th grade   RIGHT KNEE   CYSTOSCOPY N/A  03/05/2015   Procedure: CYSTOSCOPY;  Surgeon: Bjorn Loser, MD;  Location: [facility];  Service: Urology;  Laterality: N/A;   LAPAROSCOPIC VAGINAL HYSTERECTOMY WITH SALPINGECTOMY Bilateral 03/05/2015   Procedure: LAPAROSCOPIC ASSISTED VAGINAL HYSTERECTOMY WITH SALPINGECTOMY;  Surgeon: Megan Salon, MD;  Location: [facility];  Service: Gynecology;  Laterality: Bilateral;   TOTAL HIP ARTHROPLASTY Left 06/17/2012   Procedure: LEFT TOTAL HIP ARTHROPLASTY ANTERIOR APPROACH;  Surgeon: Mcarthur Rossetti, MD;  Location: [facility];  Service: Orthopedics;  Laterality: Left;    TOTAL HIP ARTHROPLASTY Right 02/11/2013   Procedure: RIGHT TOTAL HIP ARTHROPLASTY ANTERIOR APPROACH;  Surgeon: Mcarthur Rossetti, MD;  Location: [facility];  Service: Orthopedics;  Laterality: Right;   TOTAL KNEE ARTHROPLASTY Right 02/18/2021   Procedure: TOTAL KNEE ARTHROPLASTY;  Surgeon: Paralee Cancel, MD;  Location: [facility];  Service: Orthopedics;  Laterality: Right;   Family History  Problem Relation Age of Onset   Heart attack Father    Cancer Father    Tongue cancer Father    Breast cancer Sister 29   Lung cancer Sister    Cancer Sister    Lung cancer Mother    Cancer Mother    Lupus Sister    Cancer Sister    Breast cancer Sister    Healthy Brother    Cancer Maternal Aunt    Cancer Maternal Uncle    Cancer Paternal Aunt    Cancer Paternal Uncle    Healthy Brother    Diabetes Neg Hx    Hypertension Neg Hx    Thyroid disease Neg Hx    Social History   Socioeconomic History   Marital status: Married    Spouse name: Not on file   Number of children: Not on file   Years of education: Not on file   Highest education level: Not on file  Occupational History   Not on file  Tobacco Use   Smoking status: Never   Smokeless tobacco: Never  Vaping Use   Vaping Use: Never used  Substance and Sexual Activity   Alcohol use: Not Currently    Comment: occ   Drug use: No   Sexual activity: Yes    Partners: Male    Birth control/protection: Post-menopausal, Surgical    Comment: hysterectomy  Other Topics Concern   Not on file  Social History Narrative   Not on file   Social Determinants of Health   Financial Resource Strain: Low Risk  (07/26/2021)   Overall Financial Resource Strain (CARDIA)    Difficulty of Paying Living Expenses: Not very hard  Food Insecurity: No Food Insecurity (07/26/2021)   Hunger Vital Sign    Worried About Running Out of Food in the Last Year: Never true    Ran Out of Food in the Last Year: Never true  Transportation Needs: No Transportation  Needs (07/26/2021)   PRAPARE - Hydrologist (Medical): No    Lack of Transportation (Non-Medical): No  Physical Activity: Insufficiently Active (07/26/2021)   Exercise Vital Sign    Days of Exercise per Week: 4 days    Minutes of Exercise per Session: 30 min  Stress: No Stress Concern Present (07/26/2021)   [place]    Feeling of Stress : Not at all  Social Connections: [place] (07/26/2021)   Social Connection and Isolation Panel [NHANES]    Frequency of Communication with Friends and Family: Three times a week    Frequency  of Social Gatherings with Friends and Family: Three times a week    Attends Religious Services: More than 4 times per year    Active Member of Clubs or Organizations: Yes    Attends Archivist Meetings: More than 4 times per year    Marital Status: Married    Tobacco Counseling Counseling given: Not Answered   Clinical Intake:                 Diabetic?no       Activities of Daily Living    02/18/2021    5:39 PM 02/05/2021   10:28 AM  In your present state of health, do you have any difficulty performing the following activities:  Hearing? 1   Vision? 0   Difficulty concentrating or making decisions? 0   Walking or climbing stairs? 0   Dressing or bathing? 0   Doing errands, shopping? 0 0    Patient Care Team: [place], Blondell Reveal, MD as PCP - General (Family Medicine)  Indicate any recent Medical Services you may have received from other than [facility] providers in the past year (date may be approximate).     Assessment:   This is a routine wellness examination for [place].  Hearing/Vision screen No results found.  Dietary issues and exercise activities discussed:     Goals Addressed   None   Depression Screen    07/26/2021   11:06 AM 07/05/2020    4:48 PM 02/17/2019    5:46 PM 10/25/2014    2:32 PM  PHQ 2/9 Scores   PHQ - 2 Score 0 0 0 0    Fall Risk    02/17/2019    5:46 PM 10/25/2014    2:32 PM  [place] in the past year? 0 No  Number falls in past yr: 0   Injury with Fall? 0   Follow up Education provided     [place]:  Any stairs in or around the home? Yes  If so, are there any without handrails? Yes  Home free of loose throw rugs in walkways, pet beds, electrical cords, etc? Yes  Adequate lighting in your home to reduce risk of falls? Yes   ASSISTIVE DEVICES UTILIZED TO PREVENT FALLS:  Life alert? No  Use of a cane, walker or w/c? No  Grab bars in the bathroom? Yes  Shower chair or bench in shower? No  Elevated toilet seat or a handicapped toilet? Yes   Cognitive Function:        07/26/2021   11:09 AM  6CIT Screen  What Year? 0 points  What time? 3 points  Count back from 20 0 points  Months in reverse 0 points  Repeat phrase 0 points    Immunizations  There is no immunization history on file for this patient.  TDAP status: Due, Education has been provided regarding the importance of this vaccine. Advised may receive this vaccine at local pharmacy or Health Dept. Aware to provide a copy of the vaccination record if obtained from local pharmacy or Health Dept. Verbalized acceptance and understanding.  Flu Vaccine status: Up to date  Pneumococcal vaccine status: Declined,  Education has been provided regarding the importance of this vaccine but patient still declined. Advised may receive this vaccine at local pharmacy or Health Dept. Aware to provide a copy of the vaccination record if obtained from local pharmacy or Health Dept. Verbalized acceptance and understanding.  Covid-19 vaccine status: Declined, Education has been provided regarding the importance of this vaccine but patient still declined. Advised may receive this vaccine at local pharmacy or Health Dept.or vaccine clinic. Aware to provide a copy of the vaccination  record if obtained from local pharmacy or Health Dept. Verbalized acceptance and understanding.  Qualifies for Shingles Vaccine? Yes   Zostavax completed No   Shingrix Completed?: No.    Education has been provided regarding the importance of this vaccine. Patient has been advised to call insurance company to determine out of pocket expense if they have not yet received this vaccine. Advised may also receive vaccine at local pharmacy or Health Dept. Verbalized acceptance and understanding.  Screening Tests Health Maintenance  Topic Date Due   COVID-19 Vaccine (1) Never done   Hepatitis C Screening  Never done   Zoster Vaccines- Shingrix (1 of 2) Never done   Pneumonia Vaccine 13+ Years old (1 - PCV) Never done   Fecal DNA (Cologuard)  12/19/2019   TETANUS/TDAP  03/04/2022 (Originally 01/14/1969)   INFLUENZA VACCINE  08/06/2021   MAMMOGRAM  05/03/2023   DEXA SCAN  Completed   HPV VACCINES  Aged Out    Health Maintenance  Health Maintenance Due  Topic Date Due   COVID-19 Vaccine (1) Never done   Hepatitis C Screening  Never done   Zoster Vaccines- Shingrix (1 of 2) Never done   Pneumonia Vaccine 31+ Years old (1 - PCV) Never done   Fecal DNA (Cologuard)  12/19/2019    Colorectal cancer screening: Type of screening: Cologuard. Completed [id number]. Repeat every 3 years  Mammogram status: Completed 1. Repeat every year  Bone Density status: Completed 01/25/19. Results reflect: Bone density results: NORMAL. Repeat every 1 years.  Lung Cancer Screening: (Low Dose CT Chest recommended if Age 60-80 years, 30 pack-year currently smoking OR have quit w/in 15years.) does not qualify.   Lung Cancer Screening Referral: n/a  Additional Screening:  Hepatitis C Screening: does qualify; Completed n/a  Vision Screening: Recommended annual ophthalmology exams for early detection of glaucoma and other disorders of the eye. Is the patient up to date with their annual eye exam?  Yes  Who is the  provider or what is the name of the office in which the patient attends annual eye exams? Doesn't remember the doctor's name.  If pt is not established with a provider, would they like to be referred to a provider to establish care? No .   Dental Screening: Recommended annual dental exams for proper oral hygiene  Community Resource Referral / Chronic Care Management: CRR required this visit?  No   CCM required this visit?  No      Plan:     I have personally reviewed and noted the following in the patient's chart:   Medical and social history Use of alcohol, tobacco or illicit drugs  Current medications and supplements including opioid prescriptions.  Functional ability and status Nutritional status Physical activity Advanced directives List of other physicians Hospitalizations, surgeries, and ER visits in previous 12 months Vitals Screenings to include cognitive, depression, and falls Referrals and appointments  In addition, I have reviewed and discussed with patient certain preventive protocols, quality metrics, and best practice recommendations. A written personalized care plan for preventive services as well as general preventive health recommendations were provided to patient.     [place], [employer]   07/26/2021   Nurse Notes: Patient was admit that I did not document that she had  not received the covid vaccine. "She stated the government would come after her and try to force her to get it."

## 2021-07-30 DIAGNOSIS — M25611 Stiffness of right shoulder, not elsewhere classified: Secondary | ICD-10-CM | POA: Diagnosis not present

## 2021-07-30 DIAGNOSIS — M7581 Other shoulder lesions, right shoulder: Secondary | ICD-10-CM | POA: Diagnosis not present

## 2021-07-30 DIAGNOSIS — M25511 Pain in right shoulder: Secondary | ICD-10-CM | POA: Diagnosis not present

## 2021-08-01 DIAGNOSIS — M47816 Spondylosis without myelopathy or radiculopathy, lumbar region: Secondary | ICD-10-CM | POA: Diagnosis not present

## 2021-08-01 DIAGNOSIS — M5442 Lumbago with sciatica, left side: Secondary | ICD-10-CM | POA: Diagnosis not present

## 2021-08-01 DIAGNOSIS — M5136 Other intervertebral disc degeneration, lumbar region: Secondary | ICD-10-CM | POA: Diagnosis not present

## 2021-08-01 DIAGNOSIS — M9903 Segmental and somatic dysfunction of lumbar region: Secondary | ICD-10-CM | POA: Diagnosis not present

## 2021-08-07 DIAGNOSIS — M25511 Pain in right shoulder: Secondary | ICD-10-CM | POA: Diagnosis not present

## 2021-08-13 DIAGNOSIS — M7581 Other shoulder lesions, right shoulder: Secondary | ICD-10-CM | POA: Diagnosis not present

## 2021-08-13 DIAGNOSIS — M25511 Pain in right shoulder: Secondary | ICD-10-CM | POA: Diagnosis not present

## 2021-08-13 DIAGNOSIS — M25611 Stiffness of right shoulder, not elsewhere classified: Secondary | ICD-10-CM | POA: Diagnosis not present

## 2021-08-15 DIAGNOSIS — M5442 Lumbago with sciatica, left side: Secondary | ICD-10-CM | POA: Diagnosis not present

## 2021-08-15 DIAGNOSIS — M9903 Segmental and somatic dysfunction of lumbar region: Secondary | ICD-10-CM | POA: Diagnosis not present

## 2021-08-15 DIAGNOSIS — M47816 Spondylosis without myelopathy or radiculopathy, lumbar region: Secondary | ICD-10-CM | POA: Diagnosis not present

## 2021-08-15 DIAGNOSIS — M5136 Other intervertebral disc degeneration, lumbar region: Secondary | ICD-10-CM | POA: Diagnosis not present

## 2021-08-20 DIAGNOSIS — M1711 Unilateral primary osteoarthritis, right knee: Secondary | ICD-10-CM | POA: Diagnosis not present

## 2021-08-20 DIAGNOSIS — Z471 Aftercare following joint replacement surgery: Secondary | ICD-10-CM | POA: Diagnosis not present

## 2021-08-20 DIAGNOSIS — M25511 Pain in right shoulder: Secondary | ICD-10-CM | POA: Diagnosis not present

## 2021-08-20 DIAGNOSIS — M7581 Other shoulder lesions, right shoulder: Secondary | ICD-10-CM | POA: Diagnosis not present

## 2021-08-20 DIAGNOSIS — R262 Difficulty in walking, not elsewhere classified: Secondary | ICD-10-CM | POA: Diagnosis not present

## 2021-08-20 DIAGNOSIS — M25561 Pain in right knee: Secondary | ICD-10-CM | POA: Diagnosis not present

## 2021-08-20 DIAGNOSIS — M25611 Stiffness of right shoulder, not elsewhere classified: Secondary | ICD-10-CM | POA: Diagnosis not present

## 2021-08-20 DIAGNOSIS — M25661 Stiffness of right knee, not elsewhere classified: Secondary | ICD-10-CM | POA: Diagnosis not present

## 2021-08-27 DIAGNOSIS — M7581 Other shoulder lesions, right shoulder: Secondary | ICD-10-CM | POA: Diagnosis not present

## 2021-08-27 DIAGNOSIS — M25511 Pain in right shoulder: Secondary | ICD-10-CM | POA: Diagnosis not present

## 2021-08-27 DIAGNOSIS — M25611 Stiffness of right shoulder, not elsewhere classified: Secondary | ICD-10-CM | POA: Diagnosis not present

## 2021-08-28 DIAGNOSIS — M47816 Spondylosis without myelopathy or radiculopathy, lumbar region: Secondary | ICD-10-CM | POA: Diagnosis not present

## 2021-08-28 DIAGNOSIS — M5136 Other intervertebral disc degeneration, lumbar region: Secondary | ICD-10-CM | POA: Diagnosis not present

## 2021-08-28 DIAGNOSIS — M9903 Segmental and somatic dysfunction of lumbar region: Secondary | ICD-10-CM | POA: Diagnosis not present

## 2021-08-28 DIAGNOSIS — M5442 Lumbago with sciatica, left side: Secondary | ICD-10-CM | POA: Diagnosis not present

## 2021-09-05 DIAGNOSIS — M5442 Lumbago with sciatica, left side: Secondary | ICD-10-CM | POA: Diagnosis not present

## 2021-09-05 DIAGNOSIS — M47816 Spondylosis without myelopathy or radiculopathy, lumbar region: Secondary | ICD-10-CM | POA: Diagnosis not present

## 2021-09-05 DIAGNOSIS — M9903 Segmental and somatic dysfunction of lumbar region: Secondary | ICD-10-CM | POA: Diagnosis not present

## 2021-09-05 DIAGNOSIS — M5136 Other intervertebral disc degeneration, lumbar region: Secondary | ICD-10-CM | POA: Diagnosis not present

## 2021-09-10 DIAGNOSIS — M5442 Lumbago with sciatica, left side: Secondary | ICD-10-CM | POA: Diagnosis not present

## 2021-09-10 DIAGNOSIS — M47816 Spondylosis without myelopathy or radiculopathy, lumbar region: Secondary | ICD-10-CM | POA: Diagnosis not present

## 2021-09-10 DIAGNOSIS — M9903 Segmental and somatic dysfunction of lumbar region: Secondary | ICD-10-CM | POA: Diagnosis not present

## 2021-09-10 DIAGNOSIS — M545 Low back pain, unspecified: Secondary | ICD-10-CM | POA: Diagnosis not present

## 2021-09-10 DIAGNOSIS — M5136 Other intervertebral disc degeneration, lumbar region: Secondary | ICD-10-CM | POA: Diagnosis not present

## 2021-09-10 DIAGNOSIS — R350 Frequency of micturition: Secondary | ICD-10-CM | POA: Diagnosis not present

## 2021-09-17 DIAGNOSIS — M25511 Pain in right shoulder: Secondary | ICD-10-CM | POA: Diagnosis not present

## 2021-09-17 DIAGNOSIS — M25611 Stiffness of right shoulder, not elsewhere classified: Secondary | ICD-10-CM | POA: Diagnosis not present

## 2021-09-17 DIAGNOSIS — M7581 Other shoulder lesions, right shoulder: Secondary | ICD-10-CM | POA: Diagnosis not present

## 2021-09-26 DIAGNOSIS — M5442 Lumbago with sciatica, left side: Secondary | ICD-10-CM | POA: Diagnosis not present

## 2021-09-26 DIAGNOSIS — M9903 Segmental and somatic dysfunction of lumbar region: Secondary | ICD-10-CM | POA: Diagnosis not present

## 2021-09-26 DIAGNOSIS — M47816 Spondylosis without myelopathy or radiculopathy, lumbar region: Secondary | ICD-10-CM | POA: Diagnosis not present

## 2021-09-26 DIAGNOSIS — M5136 Other intervertebral disc degeneration, lumbar region: Secondary | ICD-10-CM | POA: Diagnosis not present

## 2021-10-01 DIAGNOSIS — M25511 Pain in right shoulder: Secondary | ICD-10-CM | POA: Diagnosis not present

## 2021-10-01 DIAGNOSIS — M7581 Other shoulder lesions, right shoulder: Secondary | ICD-10-CM | POA: Diagnosis not present

## 2021-10-01 DIAGNOSIS — M25611 Stiffness of right shoulder, not elsewhere classified: Secondary | ICD-10-CM | POA: Diagnosis not present

## 2021-10-03 DIAGNOSIS — M5136 Other intervertebral disc degeneration, lumbar region: Secondary | ICD-10-CM | POA: Diagnosis not present

## 2021-10-03 DIAGNOSIS — M5442 Lumbago with sciatica, left side: Secondary | ICD-10-CM | POA: Diagnosis not present

## 2021-10-03 DIAGNOSIS — M47816 Spondylosis without myelopathy or radiculopathy, lumbar region: Secondary | ICD-10-CM | POA: Diagnosis not present

## 2021-10-03 DIAGNOSIS — M9903 Segmental and somatic dysfunction of lumbar region: Secondary | ICD-10-CM | POA: Diagnosis not present

## 2021-10-08 DIAGNOSIS — M25561 Pain in right knee: Secondary | ICD-10-CM | POA: Diagnosis not present

## 2021-10-08 DIAGNOSIS — M47816 Spondylosis without myelopathy or radiculopathy, lumbar region: Secondary | ICD-10-CM | POA: Diagnosis not present

## 2021-10-08 DIAGNOSIS — M25661 Stiffness of right knee, not elsewhere classified: Secondary | ICD-10-CM | POA: Diagnosis not present

## 2021-10-08 DIAGNOSIS — M5442 Lumbago with sciatica, left side: Secondary | ICD-10-CM | POA: Diagnosis not present

## 2021-10-08 DIAGNOSIS — M9903 Segmental and somatic dysfunction of lumbar region: Secondary | ICD-10-CM | POA: Diagnosis not present

## 2021-10-08 DIAGNOSIS — M25611 Stiffness of right shoulder, not elsewhere classified: Secondary | ICD-10-CM | POA: Diagnosis not present

## 2021-10-08 DIAGNOSIS — R262 Difficulty in walking, not elsewhere classified: Secondary | ICD-10-CM | POA: Diagnosis not present

## 2021-10-08 DIAGNOSIS — M25511 Pain in right shoulder: Secondary | ICD-10-CM | POA: Diagnosis not present

## 2021-10-08 DIAGNOSIS — M1711 Unilateral primary osteoarthritis, right knee: Secondary | ICD-10-CM | POA: Diagnosis not present

## 2021-10-08 DIAGNOSIS — Z471 Aftercare following joint replacement surgery: Secondary | ICD-10-CM | POA: Diagnosis not present

## 2021-10-08 DIAGNOSIS — M5136 Other intervertebral disc degeneration, lumbar region: Secondary | ICD-10-CM | POA: Diagnosis not present

## 2021-10-08 DIAGNOSIS — M7581 Other shoulder lesions, right shoulder: Secondary | ICD-10-CM | POA: Diagnosis not present

## 2021-10-17 DIAGNOSIS — M47816 Spondylosis without myelopathy or radiculopathy, lumbar region: Secondary | ICD-10-CM | POA: Diagnosis not present

## 2021-10-17 DIAGNOSIS — M7581 Other shoulder lesions, right shoulder: Secondary | ICD-10-CM | POA: Diagnosis not present

## 2021-10-17 DIAGNOSIS — M25511 Pain in right shoulder: Secondary | ICD-10-CM | POA: Diagnosis not present

## 2021-10-17 DIAGNOSIS — M9903 Segmental and somatic dysfunction of lumbar region: Secondary | ICD-10-CM | POA: Diagnosis not present

## 2021-10-17 DIAGNOSIS — M25611 Stiffness of right shoulder, not elsewhere classified: Secondary | ICD-10-CM | POA: Diagnosis not present

## 2021-10-17 DIAGNOSIS — M5136 Other intervertebral disc degeneration, lumbar region: Secondary | ICD-10-CM | POA: Diagnosis not present

## 2021-10-17 DIAGNOSIS — M5442 Lumbago with sciatica, left side: Secondary | ICD-10-CM | POA: Diagnosis not present

## 2021-10-24 DIAGNOSIS — M5442 Lumbago with sciatica, left side: Secondary | ICD-10-CM | POA: Diagnosis not present

## 2021-10-24 DIAGNOSIS — M5136 Other intervertebral disc degeneration, lumbar region: Secondary | ICD-10-CM | POA: Diagnosis not present

## 2021-10-24 DIAGNOSIS — M47816 Spondylosis without myelopathy or radiculopathy, lumbar region: Secondary | ICD-10-CM | POA: Diagnosis not present

## 2021-10-24 DIAGNOSIS — M9903 Segmental and somatic dysfunction of lumbar region: Secondary | ICD-10-CM | POA: Diagnosis not present

## 2021-10-31 DIAGNOSIS — M5442 Lumbago with sciatica, left side: Secondary | ICD-10-CM | POA: Diagnosis not present

## 2021-10-31 DIAGNOSIS — M47816 Spondylosis without myelopathy or radiculopathy, lumbar region: Secondary | ICD-10-CM | POA: Diagnosis not present

## 2021-10-31 DIAGNOSIS — M5136 Other intervertebral disc degeneration, lumbar region: Secondary | ICD-10-CM | POA: Diagnosis not present

## 2021-10-31 DIAGNOSIS — M9903 Segmental and somatic dysfunction of lumbar region: Secondary | ICD-10-CM | POA: Diagnosis not present

## 2021-11-05 DIAGNOSIS — M25611 Stiffness of right shoulder, not elsewhere classified: Secondary | ICD-10-CM | POA: Diagnosis not present

## 2021-11-05 DIAGNOSIS — M25511 Pain in right shoulder: Secondary | ICD-10-CM | POA: Diagnosis not present

## 2021-11-05 DIAGNOSIS — M7581 Other shoulder lesions, right shoulder: Secondary | ICD-10-CM | POA: Diagnosis not present

## 2021-12-04 DIAGNOSIS — M25511 Pain in right shoulder: Secondary | ICD-10-CM | POA: Diagnosis not present

## 2021-12-04 DIAGNOSIS — M25611 Stiffness of right shoulder, not elsewhere classified: Secondary | ICD-10-CM | POA: Diagnosis not present

## 2021-12-04 DIAGNOSIS — M7581 Other shoulder lesions, right shoulder: Secondary | ICD-10-CM | POA: Diagnosis not present

## 2021-12-04 DIAGNOSIS — L821 Other seborrheic keratosis: Secondary | ICD-10-CM | POA: Diagnosis not present

## 2021-12-04 DIAGNOSIS — L814 Other melanin hyperpigmentation: Secondary | ICD-10-CM | POA: Diagnosis not present

## 2021-12-10 DIAGNOSIS — M5442 Lumbago with sciatica, left side: Secondary | ICD-10-CM | POA: Diagnosis not present

## 2021-12-10 DIAGNOSIS — M9903 Segmental and somatic dysfunction of lumbar region: Secondary | ICD-10-CM | POA: Diagnosis not present

## 2021-12-10 DIAGNOSIS — M47816 Spondylosis without myelopathy or radiculopathy, lumbar region: Secondary | ICD-10-CM | POA: Diagnosis not present

## 2021-12-10 DIAGNOSIS — M5136 Other intervertebral disc degeneration, lumbar region: Secondary | ICD-10-CM | POA: Diagnosis not present

## 2021-12-12 DIAGNOSIS — M5136 Other intervertebral disc degeneration, lumbar region: Secondary | ICD-10-CM | POA: Diagnosis not present

## 2021-12-12 DIAGNOSIS — M9903 Segmental and somatic dysfunction of lumbar region: Secondary | ICD-10-CM | POA: Diagnosis not present

## 2021-12-12 DIAGNOSIS — M47816 Spondylosis without myelopathy or radiculopathy, lumbar region: Secondary | ICD-10-CM | POA: Diagnosis not present

## 2021-12-12 DIAGNOSIS — M5442 Lumbago with sciatica, left side: Secondary | ICD-10-CM | POA: Diagnosis not present

## 2021-12-16 ENCOUNTER — Ambulatory Visit (INDEPENDENT_AMBULATORY_CARE_PROVIDER_SITE_OTHER): Payer: Medicare HMO | Admitting: Family Medicine

## 2021-12-16 ENCOUNTER — Encounter (HOSPITAL_BASED_OUTPATIENT_CLINIC_OR_DEPARTMENT_OTHER): Payer: Self-pay | Admitting: Family Medicine

## 2021-12-16 VITALS — BP 120/69 | HR 79 | Ht 64.0 in | Wt 144.5 lb

## 2021-12-16 DIAGNOSIS — Z0000 Encounter for general adult medical examination without abnormal findings: Secondary | ICD-10-CM

## 2021-12-16 DIAGNOSIS — E785 Hyperlipidemia, unspecified: Secondary | ICD-10-CM | POA: Insufficient documentation

## 2021-12-16 DIAGNOSIS — E559 Vitamin D deficiency, unspecified: Secondary | ICD-10-CM

## 2021-12-16 NOTE — Patient Instructions (Signed)
  Medication Instructions:  Your physician recommends that you continue on your current medications as directed. Please refer to the Current Medication list given to you today. --If you need a refill on any your medications before your next appointment, please call your pharmacy first. If no refills are authorized on file call the office.-- Lab Work: Your physician has recommended that you have lab work today: No , schedule nurse visit for labs If you have labs (blood work) drawn today and your tests are completely normal, you will receive your results via [place] a phone call from our staff.  Please ensure you check your voicemail in the event that you authorized detailed messages to be left on a delegated number. If you have any lab test that is abnormal or we need to change your treatment, we will call you to review the results.  Referrals/Procedures/Imaging: No  Follow-Up: Your next appointment:   Your physician recommends that you schedule a follow-up appointment in: 4-6 months with Dr. de [place].  You will receive a text message or e-mail with a link to a survey about your care and experience with [place] today! We would greatly appreciate your feedback!   Thanks for letting us be apart of your health journey!!  Primary Care and Sports Medicine   Dr. Arlina Robes [place]   We encourage you to activate your patient portal called "MyChart".  Sign up information is provided on this After Visit Summary.  MyChart is used to connect with patients for Virtual Visits (Telemedicine).  Patients are able to view lab/test results, encounter notes, upcoming appointments, etc.  Non-urgent messages can be sent to your provider as well. To learn more about what you can do with MyChart, please visit --  [email or web address].

## 2021-12-16 NOTE — Assessment & Plan Note (Signed)
Noted to be slightly deficient on labs about 2 years ago.  We will plan to recheck levels with labs near time of upcoming physical appointment

## 2021-12-16 NOTE — Progress Notes (Signed)
    Procedures performed today:    None.  Independent interpretation of notes and tests performed by another provider:   None.  Brief History, Exam, Impression, and Recommendations:    BP 120/69 (BP Location: Left Arm, Patient Position: Sitting, Cuff Size: Large)   Pulse 79   Ht '5\' 4"'$  (1.626 m)   Wt 144 lb 8 oz (65.5 kg)   LMP 01/07/1996 (Approximate)   SpO2 100%   BMI 24.80 kg/m   Hyperlipidemia Noted on prior labs, last checked about 2 years ago.  We will plan to recheck this with upcoming labs around time of physical appointment in 4 to 6 months  Vitamin D deficiency Noted to be slightly deficient on labs about 2 years ago.  We will plan to recheck levels with labs near time of upcoming physical appointment  Since last appointment with me, she has had right knee surgery, has also been working with various orthopedic specialist as outlined below. Right knee doing well s/p TKA - will have some stiffness after prolonged sitting. She is concerned that this would affect things, such as jury duty which requires prolonged sitting Seeing Dr. Paulla Fore for right shoulder - did receive steroid injection Saw Eoin with [facility] - down the road from [place] here Will be seeing [facility] for right shoulder (Dr. Tamera Punt)  Return in about 4 months (around 04/17/2022) for CPE - with [facility] a few days prior.   ___________________________________________ Tarin Navarez de [place], MD, ABFM, CAQSM Primary Care and [place]

## 2021-12-16 NOTE — Assessment & Plan Note (Signed)
Noted on prior labs, last checked about 2 years ago.  We will plan to recheck this with upcoming labs around time of physical appointment in 4 to 6 months

## 2021-12-17 DIAGNOSIS — M5136 Other intervertebral disc degeneration, lumbar region: Secondary | ICD-10-CM | POA: Diagnosis not present

## 2021-12-17 DIAGNOSIS — M5442 Lumbago with sciatica, left side: Secondary | ICD-10-CM | POA: Diagnosis not present

## 2021-12-17 DIAGNOSIS — M47816 Spondylosis without myelopathy or radiculopathy, lumbar region: Secondary | ICD-10-CM | POA: Diagnosis not present

## 2021-12-17 DIAGNOSIS — M9903 Segmental and somatic dysfunction of lumbar region: Secondary | ICD-10-CM | POA: Diagnosis not present

## 2021-12-23 ENCOUNTER — Ambulatory Visit (HOSPITAL_BASED_OUTPATIENT_CLINIC_OR_DEPARTMENT_OTHER): Payer: Medicare HMO | Admitting: Family Medicine

## 2021-12-25 DIAGNOSIS — M5136 Other intervertebral disc degeneration, lumbar region: Secondary | ICD-10-CM | POA: Diagnosis not present

## 2021-12-25 DIAGNOSIS — M5442 Lumbago with sciatica, left side: Secondary | ICD-10-CM | POA: Diagnosis not present

## 2021-12-25 DIAGNOSIS — M47816 Spondylosis without myelopathy or radiculopathy, lumbar region: Secondary | ICD-10-CM | POA: Diagnosis not present

## 2021-12-25 DIAGNOSIS — M9903 Segmental and somatic dysfunction of lumbar region: Secondary | ICD-10-CM | POA: Diagnosis not present

## 2022-01-01 DIAGNOSIS — M9903 Segmental and somatic dysfunction of lumbar region: Secondary | ICD-10-CM | POA: Diagnosis not present

## 2022-01-01 DIAGNOSIS — M47816 Spondylosis without myelopathy or radiculopathy, lumbar region: Secondary | ICD-10-CM | POA: Diagnosis not present

## 2022-01-01 DIAGNOSIS — M5442 Lumbago with sciatica, left side: Secondary | ICD-10-CM | POA: Diagnosis not present

## 2022-01-01 DIAGNOSIS — M5136 Other intervertebral disc degeneration, lumbar region: Secondary | ICD-10-CM | POA: Diagnosis not present

## 2022-01-08 DIAGNOSIS — M5136 Other intervertebral disc degeneration, lumbar region: Secondary | ICD-10-CM | POA: Diagnosis not present

## 2022-01-08 DIAGNOSIS — M5442 Lumbago with sciatica, left side: Secondary | ICD-10-CM | POA: Diagnosis not present

## 2022-01-08 DIAGNOSIS — M47816 Spondylosis without myelopathy or radiculopathy, lumbar region: Secondary | ICD-10-CM | POA: Diagnosis not present

## 2022-01-08 DIAGNOSIS — M9903 Segmental and somatic dysfunction of lumbar region: Secondary | ICD-10-CM | POA: Diagnosis not present

## 2022-01-08 DIAGNOSIS — M19011 Primary osteoarthritis, right shoulder: Secondary | ICD-10-CM | POA: Diagnosis not present

## 2022-01-09 ENCOUNTER — Other Ambulatory Visit: Payer: Self-pay | Admitting: Orthopedic Surgery

## 2022-01-09 DIAGNOSIS — M25511 Pain in right shoulder: Secondary | ICD-10-CM

## 2022-01-14 ENCOUNTER — Ambulatory Visit
Admission: RE | Admit: 2022-01-14 | Discharge: 2022-01-14 | Disposition: A | Discharge status: Home / Self Care | Payer: Medicare HMO | Source: Ambulatory Visit | Attending: Orthopedic Surgery | Admitting: Orthopedic Surgery

## 2022-01-14 DIAGNOSIS — M25511 Pain in right shoulder: Secondary | ICD-10-CM

## 2022-01-15 DIAGNOSIS — L720 Epidermal cyst: Secondary | ICD-10-CM | POA: Diagnosis not present

## 2022-01-15 DIAGNOSIS — I788 Other diseases of capillaries: Secondary | ICD-10-CM | POA: Diagnosis not present

## 2022-01-16 DIAGNOSIS — M9903 Segmental and somatic dysfunction of lumbar region: Secondary | ICD-10-CM | POA: Diagnosis not present

## 2022-01-16 DIAGNOSIS — M5442 Lumbago with sciatica, left side: Secondary | ICD-10-CM | POA: Diagnosis not present

## 2022-01-16 DIAGNOSIS — M47816 Spondylosis without myelopathy or radiculopathy, lumbar region: Secondary | ICD-10-CM | POA: Diagnosis not present

## 2022-01-16 DIAGNOSIS — M5136 Other intervertebral disc degeneration, lumbar region: Secondary | ICD-10-CM | POA: Diagnosis not present

## 2022-01-17 ENCOUNTER — Other Ambulatory Visit (HOSPITAL_BASED_OUTPATIENT_CLINIC_OR_DEPARTMENT_OTHER): Payer: Self-pay | Admitting: Family Medicine

## 2022-01-17 DIAGNOSIS — E041 Nontoxic single thyroid nodule: Secondary | ICD-10-CM

## 2022-01-17 NOTE — Progress Notes (Signed)
Received notification of incidentally observed thyroid nodules on CT scan of the right upper extremity.  Radiology reports observation bilateral thyroid nodules measuring up to 1.9 cm on the right with recommendation for obtaining thyroid ultrasound to further evaluate.  Order placed today for ultrasound of the thyroid to be done at [facility].

## 2022-01-20 DIAGNOSIS — M19011 Primary osteoarthritis, right shoulder: Secondary | ICD-10-CM | POA: Diagnosis not present

## 2022-01-28 DIAGNOSIS — M9903 Segmental and somatic dysfunction of lumbar region: Secondary | ICD-10-CM | POA: Diagnosis not present

## 2022-01-28 DIAGNOSIS — M5136 Other intervertebral disc degeneration, lumbar region: Secondary | ICD-10-CM | POA: Diagnosis not present

## 2022-01-28 DIAGNOSIS — M5442 Lumbago with sciatica, left side: Secondary | ICD-10-CM | POA: Diagnosis not present

## 2022-01-28 DIAGNOSIS — M47816 Spondylosis without myelopathy or radiculopathy, lumbar region: Secondary | ICD-10-CM | POA: Diagnosis not present

## 2022-01-29 ENCOUNTER — Ambulatory Visit
Admission: RE | Admit: 2022-01-29 | Discharge: 2022-01-29 | Disposition: A | Discharge status: Home / Self Care | Payer: Medicare HMO | Source: Ambulatory Visit | Attending: Family Medicine | Admitting: Family Medicine

## 2022-01-29 DIAGNOSIS — E041 Nontoxic single thyroid nodule: Secondary | ICD-10-CM | POA: Diagnosis not present

## 2022-02-04 ENCOUNTER — Encounter (HOSPITAL_BASED_OUTPATIENT_CLINIC_OR_DEPARTMENT_OTHER): Payer: Self-pay | Admitting: Family Medicine

## 2022-02-04 ENCOUNTER — Ambulatory Visit (INDEPENDENT_AMBULATORY_CARE_PROVIDER_SITE_OTHER): Payer: Medicare HMO | Admitting: Family Medicine

## 2022-02-04 VITALS — BP 139/87 | HR 76 | Ht 64.0 in | Wt 147.1 lb

## 2022-02-04 DIAGNOSIS — E785 Hyperlipidemia, unspecified: Secondary | ICD-10-CM

## 2022-02-04 DIAGNOSIS — Z0000 Encounter for general adult medical examination without abnormal findings: Secondary | ICD-10-CM

## 2022-02-04 DIAGNOSIS — E559 Vitamin D deficiency, unspecified: Secondary | ICD-10-CM

## 2022-02-04 DIAGNOSIS — E041 Nontoxic single thyroid nodule: Secondary | ICD-10-CM

## 2022-02-04 NOTE — Assessment & Plan Note (Addendum)
Reviewed ultrasound results with patient in the room.  She has a TI RADS category 4 and 5 thyroid nodules. Demonstrated size of nodules while in room.  Endocrine referral today for fine-needle aspiration before definitive treatment is decided.   Will get TSH drawn today.

## 2022-02-04 NOTE — Assessment & Plan Note (Signed)
Screening lab ordered.

## 2022-02-04 NOTE — Progress Notes (Signed)
Established Patient Office Visit  Subjective   Patient ID: Aseel Truxillo, female    DOB: 05-03-1950  Age: 72 y.o. MRN: [id number]  Chief Complaint  Patient presents with   Follow-up    Pt here to f/u on ultrasound, pt only concern is mass on the right hand, weight gain and lack of sleep     HPI Follow-up for ultrasound results of thyroid.  Has small firm nodule on right palm. Unsure how long this has been present, firm to touch. Not painful.   Weight gain and sleep disturbance, wonders if related to thyroid function. Denies constipation, denies cold intolerance, denies hair loss.  Has gained 20 pound sin last 2 years.    Review of Systems  Constitutional:  Negative for chills and fever.  Respiratory:  Negative for shortness of breath.   Cardiovascular:  Negative for chest pain and palpitations.  Gastrointestinal:  Negative for abdominal pain, nausea and vomiting.  Genitourinary:  Negative for dysuria.  Neurological:  Negative for dizziness and headaches.  Endo/Heme/Allergies:        Denies hair loss, cold intolerance, constipation, dry skin.   Psychiatric/Behavioral:  The patient has insomnia.       Objective:     BP 139/87 (BP Location: Right Arm, Patient Position: Sitting, Cuff Size: Normal)   Pulse 76   Ht '5\' 4"'$  (1.626 m)   Wt 147 lb 1.6 oz (66.7 kg)   LMP 01/07/1996 (Approximate)   SpO2 98%   BMI 25.25 kg/m  BP Readings from Last 3 Encounters:  02/04/22 139/87  12/16/21 120/69  02/19/21 120/80      Physical Exam Vitals and nursing note reviewed.  Constitutional:      General: She is not in acute distress.    Appearance: Normal appearance. She is normal weight.  Neck:     Thyroid: No thyroid tenderness.     Comments: Unable to palpate thyroid nodules.  Cardiovascular:     Rate and Rhythm: Normal rate and regular rhythm.     Heart sounds: Normal heart sounds.  Pulmonary:     Effort: Pulmonary effort is normal.     Breath sounds: Normal breath  sounds.  Skin:    General: Skin is warm and dry.  Neurological:     General: No focal deficit present.     Mental Status: She is alert. Mental status is at baseline.  Psychiatric:        Mood and Affect: Mood normal.        Behavior: Behavior normal.        Thought Content: Thought content normal.        Judgment: Judgment normal.      No results found for any visits on 02/04/22.    The 10-year ASCVD risk score (Arnett DK, et al., 2019) is: 12%    Assessment & Plan:   Problem List Items Addressed This Visit     Vitamin D deficiency    Screening lab ordered.      Hyperlipidemia    Screening lab ordered.       Thyroid nodule - Primary    Reviewed ultrasound results with patient in the room.  She has a TI RADS category 4 and 5 thyroid nodules. Demonstrated size of nodules while in room.  Endocrine referral today for fine-needle aspiration before definitive treatment is decided.   Will get TSH drawn today.         Relevant Orders   Ambulatory referral to Endocrinology  Wellness examination  Firm nodule on palm of right hand. Not appropriate for I&D. Recommend watch and wait. Follow-up with PCP if does not improve.   Weight gain and sleep disturbances: will get labs today.   Labs ordered by PCP in December, did not get drawn, getting today.   Agrees with plan of care discussed.  Questions answered.   Return if symptoms worsen or fail to improve.    Chalmers Guest, FNP

## 2022-02-05 ENCOUNTER — Other Ambulatory Visit: Payer: Self-pay | Admitting: Family Medicine

## 2022-02-05 DIAGNOSIS — E041 Nontoxic single thyroid nodule: Secondary | ICD-10-CM

## 2022-02-05 LAB — LIPID PANEL
Chol/HDL Ratio: 2.7 ratio (ref 0.0–4.4)
Cholesterol, Total: 233 mg/dL — ABNORMAL HIGH (ref 100–199)
HDL: 87 mg/dL (ref >39)
LDL Chol Calc (NIH): 134 mg/dL — ABNORMAL HIGH (ref 0–99)
Triglycerides: 72 mg/dL (ref 0–149)
VLDL Cholesterol Cal: 12 mg/dL (ref 5–40)

## 2022-02-05 LAB — COMPREHENSIVE METABOLIC PANEL
ALT: 21 IU/L (ref 0–32)
AST: 22 IU/L (ref 0–40)
Albumin/Globulin Ratio: 2.4 — ABNORMAL HIGH (ref 1.2–2.2)
Albumin: 4.4 g/dL (ref 3.8–4.8)
Alkaline Phosphatase: 74 IU/L (ref 44–121)
BUN/Creatinine Ratio: 16 (ref 12–28)
BUN: 13 mg/dL (ref 8–27)
Bilirubin Total: 0.5 mg/dL (ref 0.0–1.2)
CO2: 23 mmol/L (ref 20–29)
Calcium: 9.4 mg/dL (ref 8.7–10.3)
Chloride: 100 mmol/L (ref 96–106)
Creatinine, Ser: 0.81 mg/dL (ref 0.57–1.00)
Globulin, Total: 1.8 g/dL (ref 1.5–4.5)
Glucose: 92 mg/dL (ref 70–99)
Potassium: 4.1 mmol/L (ref 3.5–5.2)
Sodium: 138 mmol/L (ref 134–144)
Total Protein: 6.2 g/dL (ref 6.0–8.5)
eGFR: 78 mL/min/{1.73_m2} (ref >59)

## 2022-02-05 LAB — CBC WITH DIFFERENTIAL/PLATELET
Basophils Absolute: 0.1 10*3/uL (ref 0.0–0.2)
Basos: 1 %
EOS (ABSOLUTE): 0.1 10*3/uL (ref 0.0–0.4)
Eos: 2 %
Hematocrit: 41 % (ref 34.0–46.6)
Hemoglobin: 14.3 g/dL (ref 11.1–15.9)
Immature Grans (Abs): 0 10*3/uL (ref 0.0–0.1)
Immature Granulocytes: 0 %
Lymphocytes Absolute: 1.9 10*3/uL (ref 0.7–3.1)
Lymphs: 30 %
MCH: 30.8 pg (ref 26.6–33.0)
MCHC: 34.9 g/dL (ref 31.5–35.7)
MCV: 88 fL (ref 79–97)
Monocytes Absolute: 0.5 10*3/uL (ref 0.1–0.9)
Monocytes: 8 %
Neutrophils Absolute: 3.8 10*3/uL (ref 1.4–7.0)
Neutrophils: 59 %
Platelets: 235 10*3/uL (ref 150–450)
RBC: 4.65 x10E6/uL (ref 3.77–5.28)
RDW: 12 % (ref 11.7–15.4)
WBC: 6.4 10*3/uL (ref 3.4–10.8)

## 2022-02-05 LAB — VITAMIN D 25 HYDROXY (VIT D DEFICIENCY, FRACTURES): Vit D, 25-Hydroxy: 42.3 ng/mL (ref 30.0–100.0)

## 2022-02-05 LAB — TSH RFX ON ABNORMAL TO FREE T4: TSH: 2.39 u[IU]/mL (ref 0.450–4.500)

## 2022-02-06 ENCOUNTER — Other Ambulatory Visit: Payer: Self-pay | Admitting: Family Medicine

## 2022-02-06 ENCOUNTER — Other Ambulatory Visit: Payer: Self-pay | Admitting: Endocrinology

## 2022-02-06 ENCOUNTER — Ambulatory Visit (HOSPITAL_BASED_OUTPATIENT_CLINIC_OR_DEPARTMENT_OTHER): Payer: Medicare HMO | Admitting: Family Medicine

## 2022-02-06 ENCOUNTER — Other Ambulatory Visit: Payer: Medicare HMO

## 2022-02-06 DIAGNOSIS — E041 Nontoxic single thyroid nodule: Secondary | ICD-10-CM

## 2022-02-11 DIAGNOSIS — L810 Postinflammatory hyperpigmentation: Secondary | ICD-10-CM | POA: Diagnosis not present

## 2022-02-12 DIAGNOSIS — Z96651 Presence of right artificial knee joint: Secondary | ICD-10-CM | POA: Diagnosis not present

## 2022-02-13 DIAGNOSIS — M5136 Other intervertebral disc degeneration, lumbar region: Secondary | ICD-10-CM | POA: Diagnosis not present

## 2022-02-13 DIAGNOSIS — M5442 Lumbago with sciatica, left side: Secondary | ICD-10-CM | POA: Diagnosis not present

## 2022-02-13 DIAGNOSIS — M9903 Segmental and somatic dysfunction of lumbar region: Secondary | ICD-10-CM | POA: Diagnosis not present

## 2022-02-13 DIAGNOSIS — M47816 Spondylosis without myelopathy or radiculopathy, lumbar region: Secondary | ICD-10-CM | POA: Diagnosis not present

## 2022-02-17 DIAGNOSIS — M25511 Pain in right shoulder: Secondary | ICD-10-CM | POA: Diagnosis not present

## 2022-02-17 DIAGNOSIS — M7581 Other shoulder lesions, right shoulder: Secondary | ICD-10-CM | POA: Diagnosis not present

## 2022-02-17 DIAGNOSIS — L309 Dermatitis, unspecified: Secondary | ICD-10-CM | POA: Diagnosis not present

## 2022-02-17 DIAGNOSIS — M25611 Stiffness of right shoulder, not elsewhere classified: Secondary | ICD-10-CM | POA: Diagnosis not present

## 2022-02-20 ENCOUNTER — Other Ambulatory Visit (HOSPITAL_COMMUNITY)
Admission: RE | Admit: 2022-02-20 | Discharge: 2022-02-20 | Disposition: A | Discharge status: Home / Self Care | Payer: Medicare HMO | Source: Ambulatory Visit | Attending: Family Medicine | Admitting: Family Medicine

## 2022-02-20 ENCOUNTER — Ambulatory Visit
Admission: RE | Admit: 2022-02-20 | Discharge: 2022-02-20 | Disposition: A | Discharge status: Home / Self Care | Payer: Medicare HMO | Source: Ambulatory Visit | Attending: Family Medicine | Admitting: Family Medicine

## 2022-02-20 DIAGNOSIS — E041 Nontoxic single thyroid nodule: Secondary | ICD-10-CM | POA: Diagnosis not present

## 2022-02-20 DIAGNOSIS — E042 Nontoxic multinodular goiter: Secondary | ICD-10-CM | POA: Diagnosis not present

## 2022-02-24 LAB — CYTOLOGY - NON PAP

## 2022-03-06 DIAGNOSIS — M9903 Segmental and somatic dysfunction of lumbar region: Secondary | ICD-10-CM | POA: Diagnosis not present

## 2022-03-06 DIAGNOSIS — M5136 Other intervertebral disc degeneration, lumbar region: Secondary | ICD-10-CM | POA: Diagnosis not present

## 2022-03-06 DIAGNOSIS — M5442 Lumbago with sciatica, left side: Secondary | ICD-10-CM | POA: Diagnosis not present

## 2022-03-06 DIAGNOSIS — M47816 Spondylosis without myelopathy or radiculopathy, lumbar region: Secondary | ICD-10-CM | POA: Diagnosis not present

## 2022-03-11 ENCOUNTER — Encounter (HOSPITAL_BASED_OUTPATIENT_CLINIC_OR_DEPARTMENT_OTHER): Payer: Self-pay | Admitting: Family Medicine

## 2022-03-11 ENCOUNTER — Ambulatory Visit (INDEPENDENT_AMBULATORY_CARE_PROVIDER_SITE_OTHER): Payer: Medicare HMO | Admitting: Family Medicine

## 2022-03-11 VITALS — BP 102/71 | HR 81 | Ht 64.0 in

## 2022-03-11 DIAGNOSIS — E041 Nontoxic single thyroid nodule: Secondary | ICD-10-CM

## 2022-03-11 NOTE — Assessment & Plan Note (Signed)
Patient had recently completed ultrasound of thyroid which did show evidence of 2 prominent nodules with recommendation for further evaluation with fine-needle aspiration.  She additionally had labs completed checking TSH.  Labs revealed normal TSH.  Fine-needle aspiration indicated benign findings from nodule of left lobe of thyroid.  Nodule from right lobe of thyroid was indeterminate and classified as category 1.  She was indicates that she was told that aspiration samples were sent to a laboratory in [place] for a second opinion.  We reviewed these findings and recommendations.  Related to category 1 finding of left lobe nodule, would recommend repeat thyroid ultrasound in 1 to 2 years for monitoring.  Related to right lobe nodule, recommendation would be for repeat FNA in about 6 weeks.  Given that she has had sample sent to [place], she would prefer to receive findings from this separate laboratory which is reasonable.  Will await these results to determine next step in regards to managing thyroid nodules.

## 2022-03-11 NOTE — Progress Notes (Signed)
    Procedures performed today:    None.  Independent interpretation of notes and tests performed by another provider:   None.  Brief History, Exam, Impression, and Recommendations:    BP 102/71 (BP Location: Right Arm, Patient Position: Sitting, Cuff Size: Normal)   Pulse 81   Ht '5\' 4"'$  (1.626 m)   LMP 01/07/1996 (Approximate)   SpO2 98%   BMI 25.25 kg/m   Thyroid nodule Patient had recently completed ultrasound of thyroid which did show evidence of 2 prominent nodules with recommendation for further evaluation with fine-needle aspiration.  She additionally had labs completed checking TSH.  Labs revealed normal TSH.  Fine-needle aspiration indicated benign findings from nodule of left lobe of thyroid.  Nodule from right lobe of thyroid was indeterminate and classified as category 1.  She was indicates that she was told that aspiration samples were sent to a laboratory in [place] for a second opinion.  We reviewed these findings and recommendations.  Related to category 1 finding of left lobe nodule, would recommend repeat thyroid ultrasound in 1 to 2 years for monitoring.  Related to right lobe nodule, recommendation would be for repeat FNA in about 6 weeks.  Given that she has had sample sent to [place], she would prefer to receive findings from this separate laboratory which is reasonable.  Will await these results to determine next step in regards to managing thyroid nodules.  Return in about 3 months (around 06/11/2022).   ___________________________________________ D[place]ota Shenk [place], MD, ABFM, CAQSM [facility] [place]

## 2022-03-19 ENCOUNTER — Encounter (HOSPITAL_BASED_OUTPATIENT_CLINIC_OR_DEPARTMENT_OTHER): Payer: Self-pay

## 2022-03-20 DIAGNOSIS — M47816 Spondylosis without myelopathy or radiculopathy, lumbar region: Secondary | ICD-10-CM | POA: Diagnosis not present

## 2022-03-20 DIAGNOSIS — M5136 Other intervertebral disc degeneration, lumbar region: Secondary | ICD-10-CM | POA: Diagnosis not present

## 2022-03-20 DIAGNOSIS — M5442 Lumbago with sciatica, left side: Secondary | ICD-10-CM | POA: Diagnosis not present

## 2022-03-20 DIAGNOSIS — M9903 Segmental and somatic dysfunction of lumbar region: Secondary | ICD-10-CM | POA: Diagnosis not present

## 2022-04-01 DIAGNOSIS — M5136 Other intervertebral disc degeneration, lumbar region: Secondary | ICD-10-CM | POA: Diagnosis not present

## 2022-04-01 DIAGNOSIS — M5442 Lumbago with sciatica, left side: Secondary | ICD-10-CM | POA: Diagnosis not present

## 2022-04-01 DIAGNOSIS — M47816 Spondylosis without myelopathy or radiculopathy, lumbar region: Secondary | ICD-10-CM | POA: Diagnosis not present

## 2022-04-01 DIAGNOSIS — M25511 Pain in right shoulder: Secondary | ICD-10-CM | POA: Diagnosis not present

## 2022-04-01 DIAGNOSIS — M25611 Stiffness of right shoulder, not elsewhere classified: Secondary | ICD-10-CM | POA: Diagnosis not present

## 2022-04-01 DIAGNOSIS — M9903 Segmental and somatic dysfunction of lumbar region: Secondary | ICD-10-CM | POA: Diagnosis not present

## 2022-04-01 DIAGNOSIS — M7581 Other shoulder lesions, right shoulder: Secondary | ICD-10-CM | POA: Diagnosis not present

## 2022-04-14 ENCOUNTER — Ambulatory Visit (HOSPITAL_BASED_OUTPATIENT_CLINIC_OR_DEPARTMENT_OTHER): Payer: Medicare HMO

## 2022-04-21 ENCOUNTER — Encounter (HOSPITAL_BASED_OUTPATIENT_CLINIC_OR_DEPARTMENT_OTHER): Payer: Medicare HMO | Admitting: Family Medicine

## 2022-04-22 DIAGNOSIS — M9903 Segmental and somatic dysfunction of lumbar region: Secondary | ICD-10-CM | POA: Diagnosis not present

## 2022-04-22 DIAGNOSIS — M47816 Spondylosis without myelopathy or radiculopathy, lumbar region: Secondary | ICD-10-CM | POA: Diagnosis not present

## 2022-04-22 DIAGNOSIS — M5442 Lumbago with sciatica, left side: Secondary | ICD-10-CM | POA: Diagnosis not present

## 2022-04-22 DIAGNOSIS — M5136 Other intervertebral disc degeneration, lumbar region: Secondary | ICD-10-CM | POA: Diagnosis not present

## 2022-05-05 ENCOUNTER — Ambulatory Visit (HOSPITAL_BASED_OUTPATIENT_CLINIC_OR_DEPARTMENT_OTHER): Payer: Medicare HMO | Admitting: Family Medicine

## 2022-05-08 DIAGNOSIS — M47816 Spondylosis without myelopathy or radiculopathy, lumbar region: Secondary | ICD-10-CM | POA: Diagnosis not present

## 2022-05-08 DIAGNOSIS — M5136 Other intervertebral disc degeneration, lumbar region: Secondary | ICD-10-CM | POA: Diagnosis not present

## 2022-05-08 DIAGNOSIS — M9903 Segmental and somatic dysfunction of lumbar region: Secondary | ICD-10-CM | POA: Diagnosis not present

## 2022-05-08 DIAGNOSIS — M5442 Lumbago with sciatica, left side: Secondary | ICD-10-CM | POA: Diagnosis not present

## 2022-05-22 ENCOUNTER — Telehealth (HOSPITAL_BASED_OUTPATIENT_CLINIC_OR_DEPARTMENT_OTHER): Payer: Self-pay | Admitting: Family Medicine

## 2022-05-22 NOTE — Telephone Encounter (Signed)
Contacted Tara Fernandez to schedule their annual wellness visit. Appointment made for 06/03/2022.  Thank you,  Judeth Cornfield,  AMB Clinical Support [facility] AW[facility] Program Direct Dial ??[phone]

## 2022-06-03 ENCOUNTER — Encounter (HOSPITAL_BASED_OUTPATIENT_CLINIC_OR_DEPARTMENT_OTHER): Payer: Self-pay

## 2022-06-03 ENCOUNTER — Ambulatory Visit (INDEPENDENT_AMBULATORY_CARE_PROVIDER_SITE_OTHER): Payer: Medicare HMO

## 2022-06-03 VITALS — BP 102/71 | Ht 64.0 in | Wt 140.0 lb

## 2022-06-03 DIAGNOSIS — Z0000 Encounter for general adult medical examination without abnormal findings: Secondary | ICD-10-CM

## 2022-06-03 DIAGNOSIS — Z780 Asymptomatic menopausal state: Secondary | ICD-10-CM | POA: Diagnosis not present

## 2022-06-03 NOTE — Progress Notes (Signed)
I connected with  Tara Fernandez on 06/03/22 by a audio enabled telemedicine application and verified that I am speaking with the correct person using two identifiers.  Patient Location: Home  Provider Location: Home Office  I discussed the limitations of evaluation and management by telemedicine. The patient expressed understanding and agreed to proceed.  Subjective:   Tara Fernandez is a 72 y.o. female who presents for Medicare Annual (Subsequent) preventive examination.  Review of Systems     Cardiac Risk Factors include: advanced age (>68men, >56 women)     Objective:    Today's Vitals   06/03/22 1359  BP: 102/71  Weight: 140 lb (63.5 kg)  Height: 5\' 4"  (1.626 m)  PainSc: 0-No pain   Body mass index is 24.03 kg/m.     06/03/2022    2:04 PM 02/18/2021    5:39 PM 02/05/2021   10:26 AM 01/10/2021    1:11 PM 03/05/2015    2:00 PM 03/05/2015    6:11 AM 02/23/2015   10:08 AM  Advanced Directives  Does Patient Have a Medical Advance Directive? Yes Yes Yes Yes No No No  Type of Estate agent of [place];Living will Healthcare Power of [place];Living will Healthcare Power of [place];Living will Healthcare Power of [place];Living will     Does patient want to make changes to medical advance directive?  No - Patient declined       Copy of Healthcare Power of Attorney in Chart? No - copy requested No - copy requested No - copy requested      Would patient like information on creating a medical advance directive?     No - patient declined information  No - patient declined information    Current Medications (verified) Outpatient Encounter Medications as of 06/03/2022  Medication Sig   Ascorbic Acid (VITAMIN C) 1000 MG tablet Take 1,000 mg by mouth daily.   B Complex-C (B-COMPLEX WITH VITAMIN C) tablet Take 1 tablet by mouth daily.   Boswellia Serrata (BOSWELLIA PO) Take 1 capsule by mouth in the morning and at bedtime.   cholecalciferol 25 MCG (1000  UT) tablet 1 tablet Orally once a day if needed   ELDERBERRY PO Take 1 capsule by mouth daily.   Nutritional Supplements (BEE POLLEN/ROYAL JELLY/HONEY PO) Take 1 capsule by mouth daily.   RED CLOVER LEAF EXTRACT PO Take 1 capsule by mouth daily.   S-Adenosylmethionine (SAM-E PO) Take 1 capsule by mouth daily.   TURMERIC-GINGER PO Take 1 capsule by mouth daily.   No facility-administered encounter medications on file as of 06/03/2022.    Allergies (verified) Tramadol, Bactrim [sulfamethoxazole-trimethoprim], Chlorhexidine, Other, Premarin [conjugated estrogens], Zithromax [azithromycin], Ciprofloxacin, and Latex   History: Past Medical History:  Diagnosis Date   Arthritis    osteoarthritis; PAIN RIGHT HIP   PONV (postoperative nausea and vomiting)    Past Surgical History:  Procedure Laterality Date   ANTERIOR AND POSTERIOR REPAIR N/A 03/05/2015   Procedure: ANTERIOR (CYSTOCELE) REPAIR, VAULT PROLAPSE AND GRAFT;  Surgeon: Alfredo Martinez, MD;  Location: [facility];  Service: Urology;  Laterality: N/A;   BREAST MASS EXCISION     Hx: of left breast   CARTILAGE SURGERY  10 th grade   RIGHT KNEE   CYSTOSCOPY N/A 03/05/2015   Procedure: CYSTOSCOPY;  Surgeon: Alfredo Martinez, MD;  Location: [facility];  Service: Urology;  Laterality: N/A;   LAPAROSCOPIC VAGINAL HYSTERECTOMY WITH SALPINGECTOMY Bilateral 03/05/2015   Procedure: LAPAROSCOPIC ASSISTED VAGINAL HYSTERECTOMY WITH SALPINGECTOMY;  Surgeon: Jerene Bears, MD;  Location: [facility];  Service: Gynecology;  Laterality: Bilateral;   TOTAL HIP ARTHROPLASTY Left 06/17/2012   Procedure: LEFT TOTAL HIP ARTHROPLASTY ANTERIOR APPROACH;  Surgeon: Kathryne Hitch, MD;  Location: [facility];  Service: Orthopedics;  Laterality: Left;   TOTAL HIP ARTHROPLASTY Right 02/11/2013   Procedure: RIGHT TOTAL HIP ARTHROPLASTY ANTERIOR APPROACH;  Surgeon: Kathryne Hitch, MD;  Location: [facility];  Service: Orthopedics;  Laterality: Right;   TOTAL KNEE ARTHROPLASTY  Right 02/18/2021   Procedure: TOTAL KNEE ARTHROPLASTY;  Surgeon: Durene Romans, MD;  Location: [facility];  Service: Orthopedics;  Laterality: Right;   Family History  Problem Relation Age of Onset   Heart attack Father    Cancer Father    Tongue cancer Father    Breast cancer Sister 86   Lung cancer Sister    Cancer Sister    Lung cancer Mother    Cancer Mother    Lupus Sister    Cancer Sister    Breast cancer Sister    Healthy Brother    Cancer Maternal Aunt    Cancer Maternal Uncle    Cancer Paternal Aunt    Cancer Paternal Uncle    Healthy Brother    Diabetes Neg Hx    Hypertension Neg Hx    Thyroid disease Neg Hx    Social History   Socioeconomic History   Marital status: Married    Spouse name: Not on file   Number of children: Not on file   Years of education: Not on file   Highest education level: Not on file  Occupational History   Not on file  Tobacco Use   Smoking status: Never   Smokeless tobacco: Never  Vaping Use   Vaping Use: Never used  Substance and Sexual Activity   Alcohol use: Not Currently    Comment: occ   Drug use: No   Sexual activity: Yes    Partners: Male    Birth control/protection: Post-menopausal, Surgical    Comment: hysterectomy  Other Topics Concern   Not on file  Social History Narrative   Not on file   Social Determinants of Health   Financial Resource Strain: Low Risk  (06/03/2022)   Overall Financial Resource Strain (CARDIA)    Difficulty of Paying Living Expenses: Not hard at all  Food Insecurity: No Food Insecurity (06/03/2022)   Hunger Vital Sign    Worried About Running Out of Food in the Last Year: Never true    Ran Out of Food in the Last Year: Never true  Transportation Needs: No Transportation Needs (06/03/2022)   PRAPARE - Administrator, Civil Service (Medical): No    Lack of Transportation (Non-Medical): No  Physical Activity: Sufficiently Active (06/03/2022)   Exercise Vital Sign    Days of Exercise  per Week: 7 days    Minutes of Exercise per Session: 30 min  Stress: No Stress Concern Present (06/03/2022)   [employer] of Occupational Health - Occupational Stress Questionnaire    Feeling of Stress : Not at all  Social Connections: Socially Integrated (06/03/2022)   Social Connection and Isolation Panel [NHANES]    Frequency of Communication with Friends and Family: More than three times a week    Frequency of Social Gatherings with Friends and Family: More than three times a week    Attends Religious Services: More than 4 times per year    Active Member of [employer] or Organizations: Yes  Attends Engineer, structural: More than 4 times per year    Marital Status: Married    Tobacco Counseling Counseling given: Yes   Clinical Intake:  Pre-visit preparation completed: Yes  Pain : No/denies pain Pain Score: 0-No pain     BMI - recorded: 24.03 Nutritional Status: BMI of 19-24  Normal Nutritional Risks: None Diabetes: No  How often do you need to have someone help you when you read instructions, pamphlets, or other written materials from your doctor or pharmacy?: 1 - Never  Diabetic?No  Interpreter Needed?: No  Information entered by :: Abby Akita Maxim, CMA   Activities of Daily Living    06/03/2022    2:05 PM 03/11/2022   10:59 AM  In your present state of health, do you have any difficulty performing the following activities:  Hearing? 0 0  Vision? 0 0  Difficulty concentrating or making decisions? 0 0  Walking or climbing stairs? 0 0  Dressing or bathing? 0 0  Doing errands, shopping? 0 0  Preparing Food and eating ? N   Using the Toilet? N   In the past six months, have you accidently leaked urine? N   Do you have problems with loss of bowel control? N   Managing your Medications? N   Managing your Finances? N   Housekeeping or managing your Housekeeping? N     Patient Care Team: [place], Buren Kos, MD as PCP - General (Family  Medicine)  Indicate any recent Medical Services you may have received from other than [facility] providers in the past year (date may be approximate).     Assessment:   This is a routine wellness examination for [place].  Hearing/Vision screen Hearing Screening - Comments:: Patient denies any hearing difficulties.   Vision Screening - Comments:: Patient states they wear reading glasses only.    Dietary issues and exercise activities discussed: Current Exercise Habits: Home exercise routine, Type of exercise: yoga, Time (Minutes): 60, Frequency (Times/Week): 7, Weekly Exercise (Minutes/Week): 420, Intensity: Mild, Exercise limited by: orthopedic condition(s)   Goals Addressed             This Visit's Progress    Patient Stated       Patient's goal is to remain as active as she currently is. 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        Depression Screen    06/03/2022    2:02 PM 03/11/2022   10:59 AM 02/04/2022    1:15 PM 12/16/2021    1:34 PM 07/26/2021   11:06 AM 07/05/2020    4:48 PM 02/17/2019    5:46 PM  PHQ 2/9 Scores  PHQ - 2 Score 0 0 0 0 0 0 0  PHQ- 9 Score  0 0 0     Exception Documentation  Medical reason Medical reason Medical reason       Fall Risk    06/03/2022    2:05 PM 03/11/2022   10:58 AM 02/04/2022    1:14 PM 12/16/2021    1:34 PM 02/17/2019    5:46 PM  Fall Risk   Falls in the past year? 0 0 0 0 0  Number falls in past yr: 0 0 0 0 0  Injury with Fall? 0 0 0 0 0  Risk for fall due to : No Fall Risks No Fall Risks No Fall Risks No Fall Risks   Follow up Falls prevention discussed Falls evaluation completed Falls evaluation completed Falls evaluation  completed Education provided    FALL RISK PREVENTION PERTAINING TO THE HOME:  Any stairs in or around the home? No  If so, are there any without handrails? Yes  Home free of loose throw rugs in walkways, pet beds, electrical cords, etc? Yes  Adequate lighting in your home to reduce risk of falls? Yes    ASSISTIVE DEVICES UTILIZED TO PREVENT FALLS:  Life alert? No  Use of a cane, walker or w/c? No  Grab bars in the bathroom? No  Shower chair or bench in shower? No  Elevated toilet seat or a handicapped toilet? No   TIMED UP AND GO:  Was the test performed? No .  Cognitive Function:        07/26/2021   11:09 AM  6CIT Screen  What Year? 0 points  What time? 3 points  Count back from 20 0 points  Months in reverse 0 points  Repeat phrase 0 points    Immunizations  There is no immunization history on file for this patient.  TDAP status: Due, Education has been provided regarding the importance of this vaccine. Advised may receive this vaccine at local pharmacy or Health Dept. Aware to provide a copy of the vaccination record if obtained from local pharmacy or Health Dept. Verbalized acceptance and understanding.  Flu Vaccine status: Up to date  Pneumococcal vaccine status: Declined,  Education has been provided regarding the importance of this vaccine but patient still declined. Advised may receive this vaccine at local pharmacy or Health Dept. Aware to provide a copy of the vaccination record if obtained from local pharmacy or Health Dept. Verbalized acceptance and understanding.   Covid-19 vaccine status: Declined, Education has been provided regarding the importance of this vaccine but patient still declined. Advised may receive this vaccine at local pharmacy or Health Dept.or vaccine clinic. Aware to provide a copy of the vaccination record if obtained from local pharmacy or Health Dept. Verbalized acceptance and understanding.  Qualifies for Shingles Vaccine? Yes   Zostavax completed No   Shingrix Completed?: No.    Education has been provided regarding the importance of this vaccine. Patient has been advised to call insurance company to determine out of pocket expense if they have not yet received this vaccine. Advised may also receive vaccine at local pharmacy or Health  Dept. Verbalized acceptance and understanding.  Screening Tests Health Maintenance  Topic Date Due   COVID-19 Vaccine (1) Never done   Hepatitis C Screening  Never done   DTaP/Tdap/Td (1 - Tdap) Never done   Zoster Vaccines- Shingrix (1 of 2) Never done   Fecal DNA (Cologuard)  12/19/2019   Medicare Annual Wellness (AWV)  07/27/2022   Pneumonia Vaccine 58+ Years old (1 of 1 - PCV) 07/27/2022 (Originally 01/15/2016)   INFLUENZA VACCINE  08/07/2022   MAMMOGRAM  05/03/2023   DEXA SCAN  Completed   HPV VACCINES  Aged Out   Colonoscopy  Discontinued    Health Maintenance  Health Maintenance Due  Topic Date Due   COVID-19 Vaccine (1) Never done   Hepatitis C Screening  Never done   DTaP/Tdap/Td (1 - Tdap) Never done   Zoster Vaccines- Shingrix (1 of 2) Never done   Fecal DNA (Cologuard)  12/19/2019   Medicare Annual Wellness (AWV)  07/27/2022    Colorectal cancer screening: Type of screening: Cologuard. Completed patient states this has been completed. No result in chart since 2018. Repeat every 3 years  Mammogram status: Completed Patient scheduled for  June 11, 2022. Repeat every year  Bone Density status: Ordered 06/03/2022. Pt provided with contact info and advised to call to schedule appt.  Lung Cancer Screening: (Low Dose CT Chest recommended if Age 66-80 years, 30 pack-year currently smoking OR have quit w/in 15years.) does not qualify.   Additional Screening:  Hepatitis C Screening: does qualify; Patient declined all vaccines  Vision Screening: Recommended annual ophthalmology exams for early detection of glaucoma and other disorders of the eye. Is the patient up to date with their annual eye exam?  No  Who is the provider or what is the name of the office in which the patient attends annual eye exams? Patient doesn't recall name of eye doctor If pt is not established with a provider, would they like to be referred to a provider to establish care? No .   Dental Screening:  Recommended annual dental exams for proper oral hygiene  Community Resource Referral / Chronic Care Management: CRR required this visit?  No   CCM required this visit?  No      Plan:     I have personally reviewed and noted the following in the patient's chart:   Medical and social history Use of alcohol, tobacco or illicit drugs  Current medications and supplements including opioid prescriptions. Patient is not currently taking opioid prescriptions. Functional ability and status Nutritional status Physical activity Advanced directives List of other physicians Hospitalizations, surgeries, and ER visits in previous 12 months Vitals Screenings to include cognitive, depression, and falls Referrals and appointments  In addition, I have reviewed and discussed with patient certain preventive protocols, quality metrics, and best practice recommendations. A written personalized care plan for preventive services as well as general preventive health recommendations were provided to patient.   Due to this being a telephonic visit, the after visit summary with patients personalized plan was offered to patient via mail or my-chart. Patient would like to access their AVS via my-chart    Jordan Hawks Glendoris Nodarse, CMA   06/03/2022   Nurse Notes:   Vaccinations: declines all Influenza vaccine: recommend every Fall Pneumococcal vaccine: recommend once per lifetime Prevnar-20 Tdap vaccine: recommend every 10 years Shingles vaccine: recommend Shingrix which is 2 doses 2-6 months apart and over 90% effective     Covid-19: recommend 2 doses one month apart with a booster 6 months later

## 2022-06-03 NOTE — Patient Instructions (Signed)
Tara Fernandez , Thank you for taking time to come for your Medicare Wellness Visit. I appreciate your ongoing commitment to your health goals. Please review the following plan we discussed and let me know if I can assist you in the future.   These are the goals we discussed:  Goals      Patient Stated     Patient's goal is to remain as active as she currently is. 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         This is a list of the screening recommended for you and due dates:  Health Maintenance  Topic Date Due   COVID-19 Vaccine (1) Never done   Hepatitis C Screening  Never done   DTaP/Tdap/Td vaccine (1 - Tdap) Never done   Zoster (Shingles) Vaccine (1 of 2) Never done   Cologuard (Stool DNA test)  12/19/2019   Medicare Annual Wellness Visit  07/27/2022   Pneumonia Vaccine (1 of 1 - PCV) 07/27/2022*   Flu Shot  08/07/2022   Mammogram  05/03/2023   DEXA scan (bone density measurement)  Completed   HPV Vaccine  Aged Out   Colon Cancer Screening  Discontinued  *Topic was postponed. The date shown is not the original due date.    Advanced directives: Please bring a copy of your health care power of attorney and living will to the office to be added to your chart at your convenience.   Conditions/risks identified: Vaccinations: declines all Influenza vaccine: recommend every Fall Pneumococcal vaccine: recommend once per lifetime Prevnar-20 Tdap vaccine: recommend every 10 years Shingles vaccine: recommend Shingrix which is 2 doses 2-6 months apart and over 90% effective     Covid-19: recommend 2 doses one month apart with a booster 6 months later  I have ordered your bone density scan. They will call you regarding an appointment.   Next appointment: Follow up in one year for your annual wellness visit 06/09/2023 at 2pm via telephone   Preventive Care 65 Years and Older, Female Preventive care refers to lifestyle choices and visits with your health care provider that can  promote health and wellness. What does preventive care include? A yearly physical exam. This is also called an annual well check. Dental exams once or twice a year. Routine eye exams. Ask your health care provider how often you should have your eyes checked. Personal lifestyle choices, including: Daily care of your teeth and gums. Regular physical activity. Eating a healthy diet. Avoiding tobacco and drug use. Limiting alcohol use. Practicing safe sex. Taking low-dose aspirin every day. Taking vitamin and mineral supplements as recommended by your health care provider. What happens during an annual well check? The services and screenings done by your health care provider during your annual well check will depend on your age, overall health, lifestyle risk factors, and family history of disease. Counseling  Your health care provider may ask you questions about your: Alcohol use. Tobacco use. Drug use. Emotional well-being. Home and relationship well-being. Sexual activity. Eating habits. History of falls. Memory and ability to understand (cognition). Work and work Astronomer. Reproductive health. Screening  You may have the following tests or measurements: Height, weight, and BMI. Blood pressure. Lipid and cholesterol levels. These may be checked every 5 years, or more frequently if you are over 39 years old. Skin check. Lung cancer screening. You may have this screening every year starting at age 79 if you have a 30-pack-year history of smoking and currently smoke  or have quit within the past 15 years. Fecal occult blood test (FOBT) of the stool. You may have this test every year starting at age 48. Flexible sigmoidoscopy or colonoscopy. You may have a sigmoidoscopy every 5 years or a colonoscopy every 10 years starting at age 59. Hepatitis C blood test. Hepatitis B blood test. Sexually transmitted disease (STD) testing. Diabetes screening. This is done by checking your  blood sugar (glucose) after you have not eaten for a while (fasting). You may have this done every 1-3 years. Bone density scan. This is done to screen for osteoporosis. You may have this done starting at age 53. Mammogram. This may be done every 1-2 years. Talk to your health care provider about how often you should have regular mammograms. Talk with your health care provider about your test results, treatment options, and if necessary, the need for more tests. Vaccines  Your health care provider may recommend certain vaccines, such as: Influenza vaccine. This is recommended every year. Tetanus, diphtheria, and acellular pertussis (Tdap, Td) vaccine. You may need a Td booster every 10 years. Zoster vaccine. You may need this after age 62. Pneumococcal 13-valent conjugate (PCV13) vaccine. One dose is recommended after age 24. Pneumococcal polysaccharide (PPSV23) vaccine. One dose is recommended after age 38. Talk to your health care provider about which screenings and vaccines you need and how often you need them. This information is not intended to replace advice given to you by your health care provider. Make sure you discuss any questions you have with your health care provider. Document Released: 01/19/2015 Document Revised: 09/12/2015 Document Reviewed: 10/24/2014 Elsevier Interactive Patient Education  2017 [employer].  Fall Prevention in the Home Falls can cause injuries. They can happen to people of all ages. There are many things you can do to make your home safe and to help prevent falls. What can I do on the outside of my home? Regularly fix the edges of walkways and driveways and fix any cracks. Remove anything that might make you trip as you walk through a door, such as a raised step or threshold. Trim any bushes or trees on the path to your home. Use bright outdoor lighting. Clear any walking paths of anything that might make someone trip, such as rocks or tools. Regularly  check to see if handrails are loose or broken. Make sure that both sides of any steps have handrails. Any raised decks and porches should have guardrails on the edges. Have any leaves, snow, or ice cleared regularly. Use sand or salt on walking paths during winter. Clean up any spills in your garage right away. This includes oil or grease spills. What can I do in the bathroom? Use night lights. Install grab bars by the toilet and in the tub and shower. Do not use towel bars as grab bars. Use non-skid mats or decals in the tub or shower. If you need to sit down in the shower, use a plastic, non-slip stool. Keep the floor dry. Clean up any water that spills on the floor as soon as it happens. Remove soap buildup in the tub or shower regularly. Attach bath mats securely with double-sided non-slip rug tape. Do not have throw rugs and other things on the floor that can make you trip. What can I do in the bedroom? Use night lights. Make sure that you have a light by your bed that is easy to reach. Do not use any sheets or blankets that are too big for  your bed. They should not hang down onto the floor. Have a firm chair that has side arms. You can use this for support while you get dressed. Do not have throw rugs and other things on the floor that can make you trip. What can I do in the kitchen? Clean up any spills right away. Avoid walking on wet floors. Keep items that you use a lot in easy-to-reach places. If you need to reach something above you, use a strong step stool that has a grab bar. Keep electrical cords out of the way. Do not use floor polish or wax that makes floors slippery. If you must use wax, use non-skid floor wax. Do not have throw rugs and other things on the floor that can make you trip. What can I do with my stairs? Do not leave any items on the stairs. Make sure that there are handrails on both sides of the stairs and use them. Fix handrails that are broken or loose.  Make sure that handrails are as long as the stairways. Check any carpeting to make sure that it is firmly attached to the stairs. Fix any carpet that is loose or worn. Avoid having throw rugs at the top or bottom of the stairs. If you do have throw rugs, attach them to the floor with carpet tape. Make sure that you have a light switch at the top of the stairs and the bottom of the stairs. If you do not have them, ask someone to add them for you. What else can I do to help prevent falls? Wear shoes that: Do not have high heels. Have rubber bottoms. Are comfortable and fit you well. Are closed at the toe. Do not wear sandals. If you use a stepladder: Make sure that it is fully opened. Do not climb a closed stepladder. Make sure that both sides of the stepladder are locked into place. Ask someone to hold it for you, if possible. Clearly mark and make sure that you can see: Any grab bars or handrails. First and last steps. Where the edge of each step is. Use tools that help you move around (mobility aids) if they are needed. These include: Canes. Walkers. Scooters. Crutches. Turn on the lights when you go into a dark area. Replace any light bulbs as soon as they burn out. Set up your furniture so you have a clear path. Avoid moving your furniture around. If any of your floors are uneven, fix them. If there are any pets around you, be aware of where they are. Review your medicines with your doctor. Some medicines can make you feel dizzy. This can increase your chance of falling. Ask your doctor what other things that you can do to help prevent falls. This information is not intended to replace advice given to you by your health care provider. Make sure you discuss any questions you have with your health care provider. Document Released: 10/19/2008 Document Revised: 05/31/2015 Document Reviewed: 01/27/2014 Elsevier Interactive Patient Education  2017 [employer].

## 2022-06-04 ENCOUNTER — Ambulatory Visit: Payer: Medicare HMO | Admitting: Podiatry

## 2022-06-04 ENCOUNTER — Encounter: Payer: Self-pay | Admitting: Podiatry

## 2022-06-04 DIAGNOSIS — M21612 Bunion of left foot: Secondary | ICD-10-CM

## 2022-06-04 DIAGNOSIS — M2042 Other hammer toe(s) (acquired), left foot: Secondary | ICD-10-CM

## 2022-06-04 DIAGNOSIS — B353 Tinea pedis: Secondary | ICD-10-CM | POA: Diagnosis not present

## 2022-06-04 MED ORDER — KETOCONAZOLE 2 % EX CREA: 1.0000 | TOPICAL_CREAM | Freq: Every day | CUTANEOUS | 2 refills | Status: DC

## 2022-06-04 NOTE — Progress Notes (Signed)
  Subjective:  Patient ID: Tara Fernandez, female    DOB: Oct 26, 1950,   MRN: [id number]  Chief Complaint  Patient presents with   Rash    Rm 21 Left foot rash, bite or irritation x 6 months. Pt states she has been trying to cure whatever is is on her foot with multiple creams and oils some otc some PCP prescribed.     72 y.o. female presents for concern of rash on her left foot that has been ongoing for 6 months. She has tried multiples creams and oils and things prescribed by PCP with no relief. Also relates she has hammertoes and bunions. She is in PT and wondering if there is any stretches she can work on.  . Denies any other pedal complaints. Denies n/v/f/c.   Past Medical History:  Diagnosis Date   Arthritis    osteoarthritis; PAIN RIGHT HIP   PONV (postoperative nausea and vomiting)     Objective:  Physical Exam: Vascular: DP/PT pulses 2/4 bilateral. CFT <3 seconds. Normal hair growth on digits. No edema.  Skin. No lacerations or abrasions bilateral feet.  Musculoskeletal: MMT 5/5 bilateral lower extremities in DF, PF, Inversion and Eversion. Deceased ROM in DF of ankle joint.  Neurological: Sensation intact to light touch.   Assessment:   1. Tinea pedis, left   2. Hammertoe of left foot   3. Bunion, left      Plan:  Patient was evaluated and treated and all questions answered. -Examined patient -Discussed treatment options for tinea pedis.  -Ketoconazole prescribed  -Amb ref to PT to add on some stretching exercises for bunions and hammertoes. Did relate to patient that likely this will not correct the deformity but may help with some discomfort.  -Patient to return in 3 weeks for recheck.    Louann Sjogren, DPM

## 2022-06-09 ENCOUNTER — Other Ambulatory Visit (HOSPITAL_BASED_OUTPATIENT_CLINIC_OR_DEPARTMENT_OTHER): Payer: Medicare HMO

## 2022-06-09 ENCOUNTER — Telehealth: Payer: Self-pay | Admitting: Podiatry

## 2022-06-09 ENCOUNTER — Ambulatory Visit (INDEPENDENT_AMBULATORY_CARE_PROVIDER_SITE_OTHER): Payer: Medicare HMO | Admitting: Family Medicine

## 2022-06-09 ENCOUNTER — Ambulatory Visit (HOSPITAL_BASED_OUTPATIENT_CLINIC_OR_DEPARTMENT_OTHER): Payer: Medicare HMO | Admitting: Family Medicine

## 2022-06-09 ENCOUNTER — Encounter (HOSPITAL_BASED_OUTPATIENT_CLINIC_OR_DEPARTMENT_OTHER): Payer: Self-pay | Admitting: Family Medicine

## 2022-06-09 ENCOUNTER — Other Ambulatory Visit: Payer: Self-pay | Admitting: Podiatry

## 2022-06-09 VITALS — BP 128/90 | HR 74 | Ht 64.0 in | Wt 141.0 lb

## 2022-06-09 DIAGNOSIS — E041 Nontoxic single thyroid nodule: Secondary | ICD-10-CM | POA: Diagnosis not present

## 2022-06-09 DIAGNOSIS — R4586 Emotional lability: Secondary | ICD-10-CM | POA: Diagnosis not present

## 2022-06-09 DIAGNOSIS — Z1211 Encounter for screening for malignant neoplasm of colon: Secondary | ICD-10-CM

## 2022-06-09 DIAGNOSIS — H9313 Tinnitus, bilateral: Secondary | ICD-10-CM | POA: Diagnosis not present

## 2022-06-09 MED ORDER — CLOTRIMAZOLE-BETAMETHASONE 1-0.05 % EX CREA: 1.0000 | TOPICAL_CREAM | Freq: Every day | CUTANEOUS | 0 refills | Status: DC

## 2022-06-09 MED ORDER — TRIAMCINOLONE ACETONIDE 0.5 % EX CREA: 1.0000 | TOPICAL_CREAM | Freq: Two times a day (BID) | CUTANEOUS | 1 refills | Status: DC

## 2022-06-09 NOTE — Assessment & Plan Note (Signed)
Patient did not receive a notification about second opinion from separate laboratory in [place].  In reviewing chart, I also do not see any additional documentation regarding laboratory results.  Discussed prior results of FNA biopsy for both right and left thyroid nodules.  Again reviewed recommendations that for right lobe thyroid nodule, recommendation is for repeat FNA given that it was classified as category 1.  Left lobe thyroid nodule FNA result was classified as category 2 and thus recommendation is for repeat thyroid ultrasound in 1 to 2 years for monitoring. Patient amenable to proceeding with repeat FNA, order placed today.  Otherwise, we will plan for repeat thyroid ultrasound in about 1 to 2 years.

## 2022-06-09 NOTE — Telephone Encounter (Signed)
Pt stated she is allergic to the cream Nizoral. Pt said she's itching none stop. Pt would like to know what she should do moving forward.   Please advise

## 2022-06-09 NOTE — Assessment & Plan Note (Signed)
Patient reports various symptoms that she is concerned about possible hormone imbalances as underlying etiology.  We discussed general considerations related to symptoms being experienced and relatively low likelihood for potential concerns related to pituitary or adrenal issues.  Patient is interested in having hormone level tested, particularly with estrogen and testosterone.  Discussed that these are less likely to be root cause of current symptoms and may not be of significant benefit, however patient still wishes to proceed.  Orders placed today for these labs, she will return to complete these in the near future

## 2022-06-09 NOTE — Progress Notes (Signed)
    Procedures performed today:    None.  Independent interpretation of notes and tests performed by another provider:   None.  Brief History, Exam, Impression, and Recommendations:    BP (!) 128/90 (BP Location: Right Arm, Patient Position: Sitting, Cuff Size: Normal)   Pulse 74   Ht 5\' 4"  (1.626 m)   Wt 141 lb (64 kg)   LMP 01/07/1996 (Approximate)   SpO2 98%   BMI 24.20 kg/m   Thyroid nodule Patient did not receive a notification about second opinion from separate laboratory in [place].  In reviewing chart, I also do not see any additional documentation regarding laboratory results.  Discussed prior results of FNA biopsy for both right and left thyroid nodules.  Again reviewed recommendations that for right lobe thyroid nodule, recommendation is for repeat FNA given that it was classified as category 1.  Left lobe thyroid nodule FNA result was classified as category 2 and thus recommendation is for repeat thyroid ultrasound in 1 to 2 years for monitoring. Patient amenable to proceeding with repeat FNA, order placed today.  Otherwise, we will plan for repeat thyroid ultrasound in about 1 to 2 years.  Mood changes Patient reports various symptoms that she is concerned about possible hormone imbalances as underlying etiology.  We discussed general considerations related to symptoms being experienced and relatively low likelihood for potential concerns related to pituitary or adrenal issues.  Patient is interested in having hormone level tested, particularly with estrogen and testosterone.  Discussed that these are less likely to be root cause of current symptoms and may not be of significant benefit, however patient still wishes to proceed.  Orders placed today for these labs, she will return to complete these in the near future  Tinnitus of both ears Patient reports long history of ringing in ears, she believes bilateral, however it is generally unsure.  She has discussed this with  others and has been told about general lack of significant interventions available to address symptoms/causes.  Denies any significant pain, no significant changes in hearing. Discussed options, we will proceed with referral to audiology for further evaluation and testing.  Discussed that further recommendations will depend on results from audiometric evaluation  Patient had prior FIT testing about 2 and half years ago.  She is open to proceeding with Cologuard for colon cancer screening.  Discussed risk, benefits, limitations related to Cologuard testing.  Order for Cologuard placed, advised that she should receive this in the near future at her home address  Return in about 4 months (around 10/09/2022).   ___________________________________________ Kaila Devries de [place], MD, ABFM, CAQSM Primary Care and Sports Medicine [facility]

## 2022-06-09 NOTE — Assessment & Plan Note (Signed)
Patient reports long history of ringing in ears, she believes bilateral, however it is generally unsure.  She has discussed this with others and has been told about general lack of significant interventions available to address symptoms/causes.  Denies any significant pain, no significant changes in hearing. Discussed options, we will proceed with referral to audiology for further evaluation and testing.  Discussed that further recommendations will depend on results from audiometric evaluation

## 2022-06-09 NOTE — Patient Instructions (Signed)
  Medication Instructions:  Your physician recommends that you continue on your current medications as directed. Please refer to the Current Medication list given to you today. --If you need a refill on any your medications before your next appointment, please call your pharmacy first. If no refills are authorized on file call the office.-- Lab Work: Your physician has recommended that you have lab work today: No If you have labs (blood work) drawn today and your tests are completely normal, you will receive your results via MyChart message OR a phone call from our staff.  Please ensure you check your voicemail in the event that you authorized detailed messages to be left on a delegated number. If you have any lab test that is abnormal or we need to change your treatment, we will call you to review the results.  Referrals/Procedures/Imaging: Yes  Follow-Up: Your next appointment:   Your physician recommends that you schedule a follow-up appointment in: 3-4 months with Dr. de Cuba.  You will receive a text message or e-mail with a link to a survey about your care and experience with us today! We would greatly appreciate your feedback!   Thanks for letting us be apart of your health journey!!  Primary Care and Sports Medicine   Dr. Raymond de Cuba   We encourage you to activate your patient portal called "MyChart".  Sign up information is provided on this After Visit Summary.  MyChart is used to connect with patients for Virtual Visits (Telemedicine).  Patients are able to view lab/test results, encounter notes, upcoming appointments, etc.  Non-urgent messages can be sent to your provider as well. To learn more about what you can do with MyChart, please visit --  [email or web address].    

## 2022-06-10 DIAGNOSIS — R262 Difficulty in walking, not elsewhere classified: Secondary | ICD-10-CM | POA: Diagnosis not present

## 2022-06-10 DIAGNOSIS — R29898 Other symptoms and signs involving the musculoskeletal system: Secondary | ICD-10-CM | POA: Diagnosis not present

## 2022-06-10 DIAGNOSIS — M25561 Pain in right knee: Secondary | ICD-10-CM | POA: Diagnosis not present

## 2022-06-10 DIAGNOSIS — M25661 Stiffness of right knee, not elsewhere classified: Secondary | ICD-10-CM | POA: Diagnosis not present

## 2022-06-11 DIAGNOSIS — Z1231 Encounter for screening mammogram for malignant neoplasm of breast: Secondary | ICD-10-CM | POA: Diagnosis not present

## 2022-06-11 LAB — HM MAMMOGRAPHY: HM Mammogram: NORMAL (ref 0–4)

## 2022-06-12 DIAGNOSIS — M25511 Pain in right shoulder: Secondary | ICD-10-CM | POA: Diagnosis not present

## 2022-06-12 DIAGNOSIS — M25611 Stiffness of right shoulder, not elsewhere classified: Secondary | ICD-10-CM | POA: Diagnosis not present

## 2022-06-12 DIAGNOSIS — M7581 Other shoulder lesions, right shoulder: Secondary | ICD-10-CM | POA: Diagnosis not present

## 2022-06-13 ENCOUNTER — Other Ambulatory Visit (HOSPITAL_BASED_OUTPATIENT_CLINIC_OR_DEPARTMENT_OTHER): Payer: Medicare HMO

## 2022-06-13 ENCOUNTER — Other Ambulatory Visit (HOSPITAL_BASED_OUTPATIENT_CLINIC_OR_DEPARTMENT_OTHER): Payer: Self-pay | Admitting: Family Medicine

## 2022-06-13 DIAGNOSIS — Z1211 Encounter for screening for malignant neoplasm of colon: Secondary | ICD-10-CM | POA: Diagnosis not present

## 2022-06-13 DIAGNOSIS — R4586 Emotional lability: Secondary | ICD-10-CM

## 2022-06-16 LAB — TESTOSTERONE: Testosterone: 5 ng/dL (ref 3–67)

## 2022-06-16 LAB — ESTROGENS, TOTAL: Estrogen: 95 pg/mL (ref 40–244)

## 2022-06-17 ENCOUNTER — Telehealth (HOSPITAL_BASED_OUTPATIENT_CLINIC_OR_DEPARTMENT_OTHER): Payer: Self-pay | Admitting: Family Medicine

## 2022-06-17 ENCOUNTER — Ambulatory Visit: Payer: Medicare HMO | Attending: Family Medicine | Admitting: Audiologist

## 2022-06-17 DIAGNOSIS — H9313 Tinnitus, bilateral: Secondary | ICD-10-CM | POA: Insufficient documentation

## 2022-06-17 DIAGNOSIS — H903 Sensorineural hearing loss, bilateral: Secondary | ICD-10-CM | POA: Diagnosis not present

## 2022-06-17 NOTE — Procedures (Addendum)
  Outpatient Audiology and [facility] [address] [place], [place]  [address] [phone]  AUDIOLOGICAL  EVALUATION  NAME: Tara Fernandez     DOB:   [birth year]      MRN: [id number]                                                                                     DATE: 06/17/2022     REFERENT: de [place], Raymond J, MD STATUS: Outpatient DIAGNOSIS: Bilateral Sensorineural Hearing Loss    History: [name] was seen for an audiological evaluation. [name] is receiving a hearing evaluation due to concerns for hearing loss and tinnitus . [name] has difficulty hearing in both ears. This difficulty began around 20 years ago, gradually. No pain or pressure reported in either ear.  Tinnitus reported, but unable to recognize if it is in one or both ears. This began after seeing a chiropractor and having her neck/shoulder adjusted. High pitched buzzing noise that varies in volume.  [name] does not have a history of noise exposure. No known risk factors for hearing loss reported . No other relevant case history reported.   Evaluation:  Otoscopy showed a clear view of the tympanic membranes, bilaterally Tympanometry results were consistent with normal middle ear function and tympanic membrane movement (Type A).  Audiometric testing was completed using conventional audiometry with supraural headphones transducer. Speech Recognition Thresholds were 40 dB in the right ear and 35 dB in the left ear. Word Recognition was performed 80 dB SL, scored 100 % in the right ear and 92 in the left ear with 50 dB SL masking noise. Pure tone thresholds show a sloping mild to moderate sensorineural hearing loss in the right ear and left ear.  Pitch Matching was attempted but was unable to match the pitch of tinnitus. [name] describes it as both a low pitch and a high pitched hiss and a hum.    Results:  The test results were reviewed with [facility] and she indicated that she understood the results. Otoscopy and  tympanometry indicate normal outer and middle ear anatomy and function. Audiometry suggests a sloping mild to moderate bilateral sensorineural hearing loss. Hearing aids were recommended and information on where to purchase hearing aids was provided. Counseling was provided on tinnitus therapies and treatment. It was recommended that [name] consider Lenire tinnitus therapy or schedule an appointment with Dr. Jacinto Halim at [facility].   Recommendations: Amplification is necessary for both ears. Hearing aids can be purchased from a variety of locations. See provided list for locations in the [place] area. Hearing loss is most likely underlying cause of tinnitus.  Consider trying Lenire tinnitus therapy. Information provided in tinnitus packet.  Schedule appointment for tinnitus evaluation with Dr. Jacinto Halim at [facility].    60 minutes spent testing and counseling on results.   [name]  Audiologist, Au.D., CCC-A 06/17/2022  10:01 AM  Test Assist:  Samule Ohm, MS  Audiology Student   Cc: de [place], Buren Kos, MD

## 2022-06-17 NOTE — Telephone Encounter (Signed)
Pt declined sx at this time disregard sx clearance

## 2022-06-19 DIAGNOSIS — M5442 Lumbago with sciatica, left side: Secondary | ICD-10-CM | POA: Diagnosis not present

## 2022-06-19 DIAGNOSIS — M9903 Segmental and somatic dysfunction of lumbar region: Secondary | ICD-10-CM | POA: Diagnosis not present

## 2022-06-19 DIAGNOSIS — M47816 Spondylosis without myelopathy or radiculopathy, lumbar region: Secondary | ICD-10-CM | POA: Diagnosis not present

## 2022-06-19 DIAGNOSIS — M5136 Other intervertebral disc degeneration, lumbar region: Secondary | ICD-10-CM | POA: Diagnosis not present

## 2022-06-19 LAB — COLOGUARD: COLOGUARD: NEGATIVE

## 2022-06-23 ENCOUNTER — Ambulatory Visit: Payer: Medicare HMO | Admitting: Podiatry

## 2022-06-23 ENCOUNTER — Encounter (HOSPITAL_BASED_OUTPATIENT_CLINIC_OR_DEPARTMENT_OTHER): Payer: Self-pay | Admitting: Family Medicine

## 2022-06-23 DIAGNOSIS — B353 Tinea pedis: Secondary | ICD-10-CM | POA: Diagnosis not present

## 2022-06-23 NOTE — Progress Notes (Unsigned)
Chief Complaint  Patient presents with   Tinea Pedis    Patient states left foot is improving    Follow-up for tinea pedis on left plantar forefoot sulcus and distal second and third toes.  She used the cream for 2 weeks then stopped.  She does feel a Rubie Maid extra moisturizing emollient.  States that the itching is stopped.  She is wondering if she needs to continue with the cream.  Past Medical History:  Diagnosis Date   Arthritis    osteoarthritis; PAIN RIGHT HIP   PONV (postoperative nausea and vomiting)     Past Surgical History:  Procedure Laterality Date   ANTERIOR AND POSTERIOR REPAIR N/A 03/05/2015   Procedure: ANTERIOR (CYSTOCELE) REPAIR, VAULT PROLAPSE AND GRAFT;  Surgeon: Alfredo Martinez, MD;  Location: [facility];  Service: Urology;  Laterality: N/A;   BREAST MASS EXCISION     Hx: of left breast   CARTILAGE SURGERY  10 th grade   RIGHT KNEE   CYSTOSCOPY N/A 03/05/2015   Procedure: CYSTOSCOPY;  Surgeon: Alfredo Martinez, MD;  Location: [facility];  Service: Urology;  Laterality: N/A;   LAPAROSCOPIC VAGINAL HYSTERECTOMY WITH SALPINGECTOMY Bilateral 03/05/2015   Procedure: LAPAROSCOPIC ASSISTED VAGINAL HYSTERECTOMY WITH SALPINGECTOMY;  Surgeon: Jerene Bears, MD;  Location: [facility];  Service: Gynecology;  Laterality: Bilateral;   TOTAL HIP ARTHROPLASTY Left 06/17/2012   Procedure: LEFT TOTAL HIP ARTHROPLASTY ANTERIOR APPROACH;  Surgeon: Kathryne Hitch, MD;  Location[facility];  Service: Orthopedics;  Laterality: Left;   TOTAL HIP ARTHROPLASTY Right 02/11/2013   Procedure: RIGHT TOTAL HIP ARTHROPLASTY ANTERIOR APPROACH;  Surgeon: Kathryne Hitch, MD;  Location: [facility];  Service: Orthopedics;  Laterality: Right;   TOTAL KNEE ARTHROPLASTY Right 02/18/2021   Procedure: TOTAL KNEE ARTHROPLASTY;  Surgeon: Durene Romans, MD;  Location: [facility];  Service: Orthopedics;  Laterality: Right;    Allergies  Allergen Reactions   Tramadol Other (See Comments)    Causes numbness  in hands and right side of body   Bactrim [Sulfamethoxazole-Trimethoprim]     Pt unsure of reaction    Chlorhexidine Itching   Other     PT STATES SHE CAN TAKE HYDROCODONE/ACETAMINOPHEN FOR PAIN  WITH OUT ANY PROBLEMS.  STATES - IN THE PAST - OTHER PAIN MEDS CAUSED NAUSEA OR RASH OR FAST HEART BEAT - BUT SHE DOESN'T KNOW NAMES OF THOSE MEDS   STATES NO PROBLEMS WITH ANY PAIN MEDS GIVEN IN HOSPITAL.   Premarin [Conjugated Estrogens] Other (See Comments)    Headache, dizziness, and pelvic cramping.   Zithromax [Azithromycin] Swelling    Area around eyes swollen and red   Ciprofloxacin Hives, Itching and Rash    "joint pain", patient reports hives, rash, and itching   Latex Rash    HIVES - AFTER DENTIST USED LATEX GLOVES     Physical Exam:  General: The patient is alert and oriented x3 in no acute distress.  Dermatology: Skin is warm, dry and supple bilateral lower extremities. Interspaces are clear of maceration and debris.  Minimal dry skin or peeling present to the plantar aspect of the foot bilateral  Vascular: Palpable pedal pulses bilaterally. Capillary refill within normal limits.  No appreciable edema.  No erythema or calor.  Assessment/Plan of Care: 1. Tinea pedis, left     Discussed clinical findings with patient today.   Advised patient that she appears 90% resolved at this time and to continue for 1 more week.  Call if she needs any  refills of the ketoconazole.   Clerance Lav, DPM, FACFAS [facility]     [address] [place], [place] [address]                Office [phone]  Fax [phone]

## 2022-07-09 ENCOUNTER — Other Ambulatory Visit (HOSPITAL_COMMUNITY)
Admission: RE | Admit: 2022-07-09 | Discharge: 2022-07-09 | Disposition: A | Discharge status: Home / Self Care | Payer: Medicare HMO | Source: Ambulatory Visit | Attending: Family Medicine | Admitting: Family Medicine

## 2022-07-09 ENCOUNTER — Ambulatory Visit
Admission: RE | Admit: 2022-07-09 | Discharge: 2022-07-09 | Disposition: A | Discharge status: Home / Self Care | Payer: Medicare HMO | Source: Ambulatory Visit | Attending: Family Medicine | Admitting: Family Medicine

## 2022-07-09 DIAGNOSIS — E041 Nontoxic single thyroid nodule: Secondary | ICD-10-CM

## 2022-07-11 LAB — CYTOLOGY - NON PAP

## 2022-07-22 DIAGNOSIS — R262 Difficulty in walking, not elsewhere classified: Secondary | ICD-10-CM | POA: Diagnosis not present

## 2022-07-22 DIAGNOSIS — M25561 Pain in right knee: Secondary | ICD-10-CM | POA: Diagnosis not present

## 2022-07-22 DIAGNOSIS — M25661 Stiffness of right knee, not elsewhere classified: Secondary | ICD-10-CM | POA: Diagnosis not present

## 2022-07-22 DIAGNOSIS — R29898 Other symptoms and signs involving the musculoskeletal system: Secondary | ICD-10-CM | POA: Diagnosis not present

## 2022-07-24 DIAGNOSIS — M47816 Spondylosis without myelopathy or radiculopathy, lumbar region: Secondary | ICD-10-CM | POA: Diagnosis not present

## 2022-07-24 DIAGNOSIS — M5136 Other intervertebral disc degeneration, lumbar region: Secondary | ICD-10-CM | POA: Diagnosis not present

## 2022-07-24 DIAGNOSIS — M5442 Lumbago with sciatica, left side: Secondary | ICD-10-CM | POA: Diagnosis not present

## 2022-07-24 DIAGNOSIS — M9903 Segmental and somatic dysfunction of lumbar region: Secondary | ICD-10-CM | POA: Diagnosis not present

## 2022-07-25 ENCOUNTER — Telehealth (HOSPITAL_BASED_OUTPATIENT_CLINIC_OR_DEPARTMENT_OTHER): Payer: Self-pay | Admitting: Family Medicine

## 2022-07-25 NOTE — Telephone Encounter (Signed)
Pt is calling wanting to know about the results of her biopsy--she is not happy-- please call to give results

## 2022-07-29 DIAGNOSIS — M25561 Pain in right knee: Secondary | ICD-10-CM | POA: Diagnosis not present

## 2022-07-29 DIAGNOSIS — M25661 Stiffness of right knee, not elsewhere classified: Secondary | ICD-10-CM | POA: Diagnosis not present

## 2022-07-29 DIAGNOSIS — R262 Difficulty in walking, not elsewhere classified: Secondary | ICD-10-CM | POA: Diagnosis not present

## 2022-07-29 DIAGNOSIS — R29898 Other symptoms and signs involving the musculoskeletal system: Secondary | ICD-10-CM | POA: Diagnosis not present

## 2022-08-05 DIAGNOSIS — M5442 Lumbago with sciatica, left side: Secondary | ICD-10-CM | POA: Diagnosis not present

## 2022-08-05 DIAGNOSIS — M9903 Segmental and somatic dysfunction of lumbar region: Secondary | ICD-10-CM | POA: Diagnosis not present

## 2022-08-05 DIAGNOSIS — M47816 Spondylosis without myelopathy or radiculopathy, lumbar region: Secondary | ICD-10-CM | POA: Diagnosis not present

## 2022-08-05 DIAGNOSIS — M5136 Other intervertebral disc degeneration, lumbar region: Secondary | ICD-10-CM | POA: Diagnosis not present

## 2022-08-07 DIAGNOSIS — M25661 Stiffness of right knee, not elsewhere classified: Secondary | ICD-10-CM | POA: Diagnosis not present

## 2022-08-07 DIAGNOSIS — R29898 Other symptoms and signs involving the musculoskeletal system: Secondary | ICD-10-CM | POA: Diagnosis not present

## 2022-08-07 DIAGNOSIS — R262 Difficulty in walking, not elsewhere classified: Secondary | ICD-10-CM | POA: Diagnosis not present

## 2022-08-07 DIAGNOSIS — M25561 Pain in right knee: Secondary | ICD-10-CM | POA: Diagnosis not present

## 2022-08-12 ENCOUNTER — Encounter: Payer: Self-pay | Admitting: Podiatry

## 2022-08-12 ENCOUNTER — Ambulatory Visit: Payer: Medicare HMO | Admitting: Podiatry

## 2022-08-12 DIAGNOSIS — B353 Tinea pedis: Secondary | ICD-10-CM

## 2022-08-12 DIAGNOSIS — M2042 Other hammer toe(s) (acquired), left foot: Secondary | ICD-10-CM | POA: Diagnosis not present

## 2022-08-12 MED ORDER — CLOTRIMAZOLE-BETAMETHASONE 1-0.05 % EX CREA: 1.0000 | TOPICAL_CREAM | Freq: Every day | CUTANEOUS | 0 refills | Status: DC

## 2022-08-12 NOTE — Progress Notes (Signed)
  Subjective:  Patient ID: Von Pean, female    DOB: 1950-03-19,   MRN: [id number]  Chief Complaint  Patient presents with   Nail Problem    nail check left foot,3rd toe has a possible callus inside of the toe    72 y.o. female presents for follow-up of rash on her toes and nail check as well as possible callus in between her left second and third toes. Relates has been using mositurizer and lotrisone has been helping no issues.  . Denies any other pedal complaints. Denies n/v/f/c.   Past Medical History:  Diagnosis Date   Arthritis    osteoarthritis; PAIN RIGHT HIP   PONV (postoperative nausea and vomiting)     Objective:  Physical Exam: Vascular: DP/PT pulses 2/4 bilateral. CFT <3 seconds. Normal hair growth on digits. No edema.  Skin. No lacerations or abrasions bilateral feet. Skin clearing up some dryness noted to distal left second and third digits. Left third digit with hemmorrhage mild underlying nail.  Musculoskeletal: MMT 5/5 bilateral lower extremities in DF, PF, Inversion and Eversion. Deceased ROM in DF of ankle joint.  Neurological: Sensation intact to light touch.   Assessment:   1. Tinea pedis, left   2. Hammertoe of left foot       Plan:  Patient was evaluated and treated and all questions answered. -Examined patient -Discussed treatment options for tinea pedis.  -Lotrisone refilled.  -Discussed dystrophic nails and hammertoes. Toe cap provided.  -Patient to return as needed   Louann Sjogren, DPM

## 2022-09-04 DIAGNOSIS — M5136 Other intervertebral disc degeneration, lumbar region: Secondary | ICD-10-CM | POA: Diagnosis not present

## 2022-09-04 DIAGNOSIS — M9903 Segmental and somatic dysfunction of lumbar region: Secondary | ICD-10-CM | POA: Diagnosis not present

## 2022-09-04 DIAGNOSIS — M5442 Lumbago with sciatica, left side: Secondary | ICD-10-CM | POA: Diagnosis not present

## 2022-09-04 DIAGNOSIS — M47816 Spondylosis without myelopathy or radiculopathy, lumbar region: Secondary | ICD-10-CM | POA: Diagnosis not present

## 2022-09-04 DIAGNOSIS — M25561 Pain in right knee: Secondary | ICD-10-CM | POA: Diagnosis not present

## 2022-09-04 DIAGNOSIS — R29898 Other symptoms and signs involving the musculoskeletal system: Secondary | ICD-10-CM | POA: Diagnosis not present

## 2022-09-04 DIAGNOSIS — R262 Difficulty in walking, not elsewhere classified: Secondary | ICD-10-CM | POA: Diagnosis not present

## 2022-09-04 DIAGNOSIS — M25661 Stiffness of right knee, not elsewhere classified: Secondary | ICD-10-CM | POA: Diagnosis not present

## 2022-09-17 ENCOUNTER — Encounter: Payer: Self-pay | Admitting: Podiatry

## 2022-09-17 ENCOUNTER — Ambulatory Visit: Payer: Medicare HMO | Admitting: Podiatry

## 2022-09-17 DIAGNOSIS — L603 Nail dystrophy: Secondary | ICD-10-CM | POA: Diagnosis not present

## 2022-09-17 DIAGNOSIS — B351 Tinea unguium: Secondary | ICD-10-CM | POA: Diagnosis not present

## 2022-09-17 DIAGNOSIS — B353 Tinea pedis: Secondary | ICD-10-CM

## 2022-09-17 DIAGNOSIS — L608 Other nail disorders: Secondary | ICD-10-CM | POA: Diagnosis not present

## 2022-09-17 NOTE — Addendum Note (Signed)
Addended byMaury Dus, Khambrel Amsden L on: 09/17/2022 03:17 PM   Modules accepted: Orders

## 2022-09-17 NOTE — Progress Notes (Signed)
  Subjective:  Patient ID: Tara Fernandez, female    DOB: 01-01-1951,   MRN: [id number]  Chief Complaint  Patient presents with   Nail Problem    left foot 2nd 3rd toenails possibly dead,fungus and will it go away    72 y.o. female presents for follow-up of left second and third toenails. Relates she is concerned the nails are dead or have a fungus and has lots of questions about the nails.   . Denies any other pedal complaints. Denies n/v/f/c.   Past Medical History:  Diagnosis Date   Arthritis    osteoarthritis; PAIN RIGHT HIP   PONV (postoperative nausea and vomiting)     Objective:  Physical Exam: Vascular: DP/PT pulses 2/4 bilateral. CFT <3 seconds. Normal hair growth on digits. No edema.  Skin. No lacerations or abrasions bilateral feet. Skin clearing up some dryness noted to distal left second and third digits. Left third digit with thickened and subungual debris noted to the left second digit as well.  Musculoskeletal: MMT 5/5 bilateral lower extremities in DF, PF, Inversion and Eversion. Deceased ROM in DF of ankle joint.  Neurological: Sensation intact to light touch.   Assessment:   1. Tinea pedis, left   2. Onychomycosis        Plan:  Patient was evaluated and treated and all questions answered. -Examined patient -Discussed treatment options for tinea pedis.  -Lotrisone refilled.  -Discussed dystrophic nails and hammertoes.  -Examined patient -Discussed treatment options for painful dystrophic nails  -Clinical picture and Fungal culture was obtained by removing a portion of the hard nail itself from each of the involved toenails using a sterile nail nipper and sent to [facility] lab. Patient tolerated the biopsy procedure well without discomfort or need for anesthesia.  -Discussed fungal nail treatment options including oral, topical, and laser treatments.  -Patient to return in 4 weeks for follow up evaluation and discussion of fungal culture results or sooner if  symptoms worsen.    Louann Sjogren, DPM

## 2022-09-18 DIAGNOSIS — M5136 Other intervertebral disc degeneration, lumbar region: Secondary | ICD-10-CM | POA: Diagnosis not present

## 2022-09-18 DIAGNOSIS — M9903 Segmental and somatic dysfunction of lumbar region: Secondary | ICD-10-CM | POA: Diagnosis not present

## 2022-09-18 DIAGNOSIS — M5442 Lumbago with sciatica, left side: Secondary | ICD-10-CM | POA: Diagnosis not present

## 2022-09-18 DIAGNOSIS — M47816 Spondylosis without myelopathy or radiculopathy, lumbar region: Secondary | ICD-10-CM | POA: Diagnosis not present

## 2022-09-22 ENCOUNTER — Encounter (HOSPITAL_BASED_OUTPATIENT_CLINIC_OR_DEPARTMENT_OTHER): Payer: Self-pay | Admitting: Family Medicine

## 2022-09-22 ENCOUNTER — Ambulatory Visit (HOSPITAL_BASED_OUTPATIENT_CLINIC_OR_DEPARTMENT_OTHER): Payer: Medicare HMO | Admitting: Family Medicine

## 2022-09-22 VITALS — BP 118/78 | HR 85 | Ht 64.0 in

## 2022-09-22 DIAGNOSIS — R1013 Epigastric pain: Secondary | ICD-10-CM | POA: Diagnosis not present

## 2022-09-22 DIAGNOSIS — R7989 Other specified abnormal findings of blood chemistry: Secondary | ICD-10-CM

## 2022-09-22 DIAGNOSIS — R6882 Decreased libido: Secondary | ICD-10-CM | POA: Diagnosis not present

## 2022-09-22 MED ORDER — OMEPRAZOLE 20 MG PO CPDR: 20.0000 mg | DELAYED_RELEASE_CAPSULE | Freq: Every day | ORAL | 1 refills | Status: DC

## 2022-09-22 NOTE — Progress Notes (Unsigned)
    Procedures performed today:    None.  Independent interpretation of notes and tests performed by another provider:   None.  Brief History, Exam, Impression, and Recommendations:    BP 118/78 (BP Location: Left Arm, Patient Position: Sitting, Cuff Size: Normal)   Pulse 85   Ht 5\' 4"  (1.626 m)   LMP 01/07/1996 (Approximate)   SpO2 96%   BMI 24.20 kg/m   There are no diagnoses linked to this encounter.No follow-ups on file.   ___________________________________________ Aashi Derrington de [place], MD, ABFM, [facility] Primary Care and Sports Medicine [facility]

## 2022-09-23 ENCOUNTER — Encounter: Payer: Self-pay | Admitting: Gastroenterology

## 2022-09-23 ENCOUNTER — Ambulatory Visit: Payer: Medicare HMO | Admitting: Podiatry

## 2022-09-23 DIAGNOSIS — R29898 Other symptoms and signs involving the musculoskeletal system: Secondary | ICD-10-CM | POA: Diagnosis not present

## 2022-09-23 DIAGNOSIS — M25661 Stiffness of right knee, not elsewhere classified: Secondary | ICD-10-CM | POA: Diagnosis not present

## 2022-09-23 DIAGNOSIS — M25561 Pain in right knee: Secondary | ICD-10-CM | POA: Diagnosis not present

## 2022-09-23 DIAGNOSIS — R262 Difficulty in walking, not elsewhere classified: Secondary | ICD-10-CM | POA: Diagnosis not present

## 2022-09-25 ENCOUNTER — Telehealth (HOSPITAL_BASED_OUTPATIENT_CLINIC_OR_DEPARTMENT_OTHER): Payer: Self-pay | Admitting: Family Medicine

## 2022-09-25 DIAGNOSIS — M5442 Lumbago with sciatica, left side: Secondary | ICD-10-CM | POA: Diagnosis not present

## 2022-09-25 DIAGNOSIS — M9903 Segmental and somatic dysfunction of lumbar region: Secondary | ICD-10-CM | POA: Diagnosis not present

## 2022-09-25 DIAGNOSIS — M5136 Other intervertebral disc degeneration, lumbar region: Secondary | ICD-10-CM | POA: Diagnosis not present

## 2022-09-25 DIAGNOSIS — M47816 Spondylosis without myelopathy or radiculopathy, lumbar region: Secondary | ICD-10-CM | POA: Diagnosis not present

## 2022-09-25 NOTE — Telephone Encounter (Signed)
[place] medical needs visit notes from 09/22/22 for the referral to endocrinology.  Once closed please fax to [phone].  Visit notes and recent labs

## 2022-09-25 NOTE — Assessment & Plan Note (Addendum)
Patient reports that she recently has been experiencing upper abdominal pain, indicating epigastric area as primary location of symptoms.  She has tried over-the-counter measures including Pepto-Bismol without significant relief.  She notes that symptoms are worse with certain foods, particularly with spicy foods.  No symptoms associated with exertion.  Has not had any associated shortness of breath, dizziness, lightheadedness. On exam, patient is in no acute distress, vital signs stable. Feel that symptoms are most likely related to reflux disease, did discuss considerations for cardiac source, however feel that this is unlikely given reported symptoms as well as associated aggravating factors. We discussed treatment options today.  Given that patient is developing this new issue at current age, feel would be reasonable to have further evaluation with GI.  We can also initiate PPI and monitor for improvement with this.  Instructed on proper administration of PPI

## 2022-09-25 NOTE — Telephone Encounter (Signed)
Notes faxed, no recent labs

## 2022-09-25 NOTE — Assessment & Plan Note (Signed)
Previously, patient did have labs checked including testosterone.  Her concern is related to decreased libido.  Testosterone was found to be at lower end of normal range.  She would be interested in meeting with specialist regarding hormone management and to review if observed testosterone level could be playing a role in regards to her decreased libido.  She would like referral to endocrinology in order to follow-up on this.  Referral placed today

## 2022-10-02 DIAGNOSIS — M5136 Other intervertebral disc degeneration, lumbar region: Secondary | ICD-10-CM | POA: Diagnosis not present

## 2022-10-02 DIAGNOSIS — M5442 Lumbago with sciatica, left side: Secondary | ICD-10-CM | POA: Diagnosis not present

## 2022-10-02 DIAGNOSIS — M47816 Spondylosis without myelopathy or radiculopathy, lumbar region: Secondary | ICD-10-CM | POA: Diagnosis not present

## 2022-10-02 DIAGNOSIS — M9903 Segmental and somatic dysfunction of lumbar region: Secondary | ICD-10-CM | POA: Diagnosis not present

## 2022-10-06 DIAGNOSIS — R262 Difficulty in walking, not elsewhere classified: Secondary | ICD-10-CM | POA: Diagnosis not present

## 2022-10-06 DIAGNOSIS — R29898 Other symptoms and signs involving the musculoskeletal system: Secondary | ICD-10-CM | POA: Diagnosis not present

## 2022-10-06 DIAGNOSIS — M25561 Pain in right knee: Secondary | ICD-10-CM | POA: Diagnosis not present

## 2022-10-06 DIAGNOSIS — M25661 Stiffness of right knee, not elsewhere classified: Secondary | ICD-10-CM | POA: Diagnosis not present

## 2022-10-08 DIAGNOSIS — M25661 Stiffness of right knee, not elsewhere classified: Secondary | ICD-10-CM | POA: Diagnosis not present

## 2022-10-08 DIAGNOSIS — R262 Difficulty in walking, not elsewhere classified: Secondary | ICD-10-CM | POA: Diagnosis not present

## 2022-10-08 DIAGNOSIS — M25561 Pain in right knee: Secondary | ICD-10-CM | POA: Diagnosis not present

## 2022-10-08 DIAGNOSIS — R29898 Other symptoms and signs involving the musculoskeletal system: Secondary | ICD-10-CM | POA: Diagnosis not present

## 2022-10-10 ENCOUNTER — Encounter (HOSPITAL_BASED_OUTPATIENT_CLINIC_OR_DEPARTMENT_OTHER): Payer: Self-pay | Admitting: Family Medicine

## 2022-10-10 ENCOUNTER — Other Ambulatory Visit (HOSPITAL_BASED_OUTPATIENT_CLINIC_OR_DEPARTMENT_OTHER): Payer: Self-pay

## 2022-10-14 DIAGNOSIS — M47816 Spondylosis without myelopathy or radiculopathy, lumbar region: Secondary | ICD-10-CM | POA: Diagnosis not present

## 2022-10-14 DIAGNOSIS — M9901 Segmental and somatic dysfunction of cervical region: Secondary | ICD-10-CM | POA: Diagnosis not present

## 2022-10-14 DIAGNOSIS — M9903 Segmental and somatic dysfunction of lumbar region: Secondary | ICD-10-CM | POA: Diagnosis not present

## 2022-10-14 DIAGNOSIS — M5442 Lumbago with sciatica, left side: Secondary | ICD-10-CM | POA: Diagnosis not present

## 2022-10-17 DIAGNOSIS — R29898 Other symptoms and signs involving the musculoskeletal system: Secondary | ICD-10-CM | POA: Diagnosis not present

## 2022-10-17 DIAGNOSIS — R262 Difficulty in walking, not elsewhere classified: Secondary | ICD-10-CM | POA: Diagnosis not present

## 2022-10-17 DIAGNOSIS — M25661 Stiffness of right knee, not elsewhere classified: Secondary | ICD-10-CM | POA: Diagnosis not present

## 2022-10-17 DIAGNOSIS — M25561 Pain in right knee: Secondary | ICD-10-CM | POA: Diagnosis not present

## 2022-10-20 ENCOUNTER — Ambulatory Visit: Payer: Medicare HMO | Admitting: Podiatry

## 2022-10-20 DIAGNOSIS — L853 Xerosis cutis: Secondary | ICD-10-CM | POA: Diagnosis not present

## 2022-10-20 DIAGNOSIS — L603 Nail dystrophy: Secondary | ICD-10-CM

## 2022-10-20 MED ORDER — UREA 10 % EX CREA
TOPICAL_CREAM | Freq: Every day | CUTANEOUS | 0 refills | Status: DC
Start: 2022-10-20 — End: 2023-02-24

## 2022-10-20 NOTE — Progress Notes (Unsigned)
HPI: 72 y.o. female presents today to request the results of her left third toenail fungal culture.  She states that no one has called her yet to review the results and she would like to have those reviewed today.  This was performed by Dr. Ralene Cork on 09/17/2022.  She is also requesting a medication for the dry, flaky, peeling skin at the tips of the left second and third toes.  She is requesting the left second toenail to be trimmed today.  She does have toenail polish on today  Past Medical History:  Diagnosis Date   Arthritis    osteoarthritis; PAIN RIGHT HIP   PONV (postoperative nausea and vomiting)     Past Surgical History:  Procedure Laterality Date   ANTERIOR AND POSTERIOR REPAIR N/A 03/05/2015   Procedure: ANTERIOR (CYSTOCELE) REPAIR, VAULT PROLAPSE AND GRAFT;  Surgeon: Alfredo Martinez, MD;  Location: [facility];  Service: Urology;  Laterality: N/A;   BREAST MASS EXCISION     Hx: of left breast   CARTILAGE SURGERY  10 th grade   RIGHT KNEE   CYSTOSCOPY N/A 03/05/2015   Procedure: CYSTOSCOPY;  Surgeon: Alfredo Martinez, MD;  Location: [facility];  Service: Urology;  Laterality: N/A;   LAPAROSCOPIC VAGINAL HYSTERECTOMY WITH SALPINGECTOMY Bilateral 03/05/2015   Procedure: LAPAROSCOPIC ASSISTED VAGINAL HYSTERECTOMY WITH SALPINGECTOMY;  Surgeon: Jerene Bears, MD;  Location: [facility];  Service: Gynecology;  Laterality: Bilateral;   TOTAL HIP ARTHROPLASTY Left 06/17/2012   Procedure: LEFT TOTAL HIP ARTHROPLASTY ANTERIOR APPROACH;  Surgeon: Kathryne Hitch, MD;  Location: [facility];  Service: Orthopedics;  Laterality: Left;   TOTAL HIP ARTHROPLASTY Right 02/11/2013   Procedure: RIGHT TOTAL HIP ARTHROPLASTY ANTERIOR APPROACH;  Surgeon: Kathryne Hitch, MD;  [facility];  Service: Orthopedics;  Laterality: Right;   TOTAL KNEE ARTHROPLASTY Right 02/18/2021   Procedure: TOTAL KNEE ARTHROPLASTY;  Surgeon: Durene Romans, MD;  Location: [facility];  Service: Orthopedics;  Laterality: Right;     Allergies  Allergen Reactions   Tramadol Other (See Comments)    Causes numbness in hands and right side of body   Bactrim [Sulfamethoxazole-Trimethoprim]     Pt unsure of reaction    Chlorhexidine Itching   Other     PT STATES SHE CAN TAKE HYDROCODONE/ACETAMINOPHEN FOR PAIN  WITH OUT ANY PROBLEMS.  STATES - IN THE PAST - OTHER PAIN MEDS CAUSED NAUSEA OR RASH OR FAST HEART BEAT - BUT SHE DOESN'T KNOW NAMES OF THOSE MEDS   STATES NO PROBLEMS WITH ANY PAIN MEDS GIVEN IN HOSPITAL.   Premarin [Conjugated Estrogens] Other (See Comments)    Headache, dizziness, and pelvic cramping.   Zithromax [Azithromycin] Swelling    Area around eyes swollen and red   Ciprofloxacin Hives, Itching and Rash    "joint pain", patient reports hives, rash, and itching   Latex Rash    HIVES - AFTER DENTIST USED LATEX GLOVES   Physical Exam: There are palpable pedal pulses.  Is some xerosis without drainage or blister formation at the tips of the second and third toes on the left foot.  Could not evaluate the integrity of the nails due to the nail polish in place.  There is no surrounding erythema of the periungual areas.  Assessment/Plan of Care: 1. Xerosis of skin   2. Nail dystrophy      Meds ordered this encounter  Medications   urea (CARMOL) 10 % cream    Sig: Apply topically at bedtime. Apply to tips of  left 2nd and 3rd toes QHS    Dispense:  71 g    Refill:  0   Discussed clinical findings with patient today.  Informed the patient that I did not see the lab results from the nail specimen that was sent to the lab by Dr. Ralene Cork in the epic system today.  Informed her it can take 3 to 4 weeks for them to come in but then we have to allow time for the doctor to receive the report, review the report, and then reach out to the patient.  She states that she would not have scheduled this follow-up if she did not think the results with me here for review today.  The roughened, dry skin at the tips of  the left second and third toenails were sanded using an umbrella bur today.  The second toenail was trimmed today at her request.  Will send in a prescription for Carmol 10 cream to apply to the distal aspect of the toes on the dry flaky skin.  Follow-up as scheduled with Dr. Ralene Cork.   Clerance Lav, DPM, FACFAS [facility]     [address] [place], [place] [address]                Office [phone]  Fax [phone]

## 2022-10-21 DIAGNOSIS — M9901 Segmental and somatic dysfunction of cervical region: Secondary | ICD-10-CM | POA: Diagnosis not present

## 2022-10-21 DIAGNOSIS — M47816 Spondylosis without myelopathy or radiculopathy, lumbar region: Secondary | ICD-10-CM | POA: Diagnosis not present

## 2022-10-21 DIAGNOSIS — M9903 Segmental and somatic dysfunction of lumbar region: Secondary | ICD-10-CM | POA: Diagnosis not present

## 2022-10-21 DIAGNOSIS — M5442 Lumbago with sciatica, left side: Secondary | ICD-10-CM | POA: Diagnosis not present

## 2022-10-22 ENCOUNTER — Ambulatory Visit: Payer: Medicare HMO | Admitting: Podiatry

## 2022-10-22 DIAGNOSIS — M19011 Primary osteoarthritis, right shoulder: Secondary | ICD-10-CM | POA: Diagnosis not present

## 2022-10-23 ENCOUNTER — Telehealth (HOSPITAL_BASED_OUTPATIENT_CLINIC_OR_DEPARTMENT_OTHER): Payer: Self-pay | Admitting: Family Medicine

## 2022-10-23 NOTE — Telephone Encounter (Signed)
Surgery Clearance Recd via Fax  From  Aida Raider Ph [phone], Dr Rennis Chris with [facility] Fax back to [phone]  Noted and placed in the providers box

## 2022-10-27 ENCOUNTER — Ambulatory Visit (INDEPENDENT_AMBULATORY_CARE_PROVIDER_SITE_OTHER): Payer: Medicare HMO | Admitting: Family Medicine

## 2022-10-27 ENCOUNTER — Encounter (HOSPITAL_BASED_OUTPATIENT_CLINIC_OR_DEPARTMENT_OTHER): Payer: Self-pay | Admitting: Family Medicine

## 2022-10-27 ENCOUNTER — Ambulatory Visit: Payer: Medicare HMO | Admitting: Podiatry

## 2022-10-27 VITALS — BP 127/75 | HR 89 | Ht 63.0 in | Wt 140.0 lb

## 2022-10-27 DIAGNOSIS — Z01818 Encounter for other preprocedural examination: Secondary | ICD-10-CM | POA: Diagnosis not present

## 2022-10-27 NOTE — Progress Notes (Signed)
    Procedures performed today:    None.  Independent interpretation of notes and tests performed by another provider:   None.  Brief History, Exam, Impression, and Recommendations:    Tara Fernandez is a 72 y.o. presenting for preoperative evaluation/clearance. The planned procedure is right reverse shoulder arthroplasty with the treatment goal of pain relief and improved function. Cardiac risk for planned procedure is Intermediate (1 to 5%) - intraperitoneal or intrathoracic surgery, carotid endarterectomy, head and neck surgery, orthopedic surgery, prostate surgery  Signs or symptoms of cardiovascular disease? No New or unstable cardiopulmonary signs or symptoms? No Urinary symptoms or invasive urologic procedure? No  BP 127/75 (BP Location: Right Arm, Patient Position: Sitting, Cuff Size: Normal)   Pulse 89   Ht 5\' 3"  (1.6 m)   Wt 140 lb (63.5 kg)   LMP 01/07/1996 (Approximate)   SpO2 97%   BMI 24.80 kg/m   Exam: 72 year old female in no acute distress Cardiovascular exam with regular rate and rhythm, no murmur appreciated Lungs clear to auscultation bilaterally  Preoperative clearance Based on history and exam will order the following preoperative tests: none Patient is medically cleared to proceed with planned surgery.   ___________________________________________ Jamil Armwood de [place], MD, ABFM, CAQSM Primary Care and Sports Medicine [facility]

## 2022-10-27 NOTE — Assessment & Plan Note (Signed)
Based on history and exam will order the following preoperative tests: none Patient is medically cleared to proceed with planned surgery.

## 2022-10-28 ENCOUNTER — Ambulatory Visit (HOSPITAL_BASED_OUTPATIENT_CLINIC_OR_DEPARTMENT_OTHER): Payer: Medicare HMO | Admitting: Family Medicine

## 2022-10-28 ENCOUNTER — Ambulatory Visit: Payer: Medicare HMO | Admitting: Podiatry

## 2022-11-03 DIAGNOSIS — E041 Nontoxic single thyroid nodule: Secondary | ICD-10-CM | POA: Diagnosis not present

## 2022-11-03 DIAGNOSIS — Z780 Asymptomatic menopausal state: Secondary | ICD-10-CM | POA: Diagnosis not present

## 2022-11-05 DIAGNOSIS — L820 Inflamed seborrheic keratosis: Secondary | ICD-10-CM | POA: Diagnosis not present

## 2022-11-05 DIAGNOSIS — R208 Other disturbances of skin sensation: Secondary | ICD-10-CM | POA: Diagnosis not present

## 2022-11-05 DIAGNOSIS — Z789 Other specified health status: Secondary | ICD-10-CM | POA: Diagnosis not present

## 2022-11-05 DIAGNOSIS — L538 Other specified erythematous conditions: Secondary | ICD-10-CM | POA: Diagnosis not present

## 2022-11-06 DIAGNOSIS — M9901 Segmental and somatic dysfunction of cervical region: Secondary | ICD-10-CM | POA: Diagnosis not present

## 2022-11-06 DIAGNOSIS — M9903 Segmental and somatic dysfunction of lumbar region: Secondary | ICD-10-CM | POA: Diagnosis not present

## 2022-11-06 DIAGNOSIS — M47816 Spondylosis without myelopathy or radiculopathy, lumbar region: Secondary | ICD-10-CM | POA: Diagnosis not present

## 2022-11-06 DIAGNOSIS — M5442 Lumbago with sciatica, left side: Secondary | ICD-10-CM | POA: Diagnosis not present

## 2022-11-18 DIAGNOSIS — M47816 Spondylosis without myelopathy or radiculopathy, lumbar region: Secondary | ICD-10-CM | POA: Diagnosis not present

## 2022-11-18 DIAGNOSIS — M9901 Segmental and somatic dysfunction of cervical region: Secondary | ICD-10-CM | POA: Diagnosis not present

## 2022-11-18 DIAGNOSIS — M9903 Segmental and somatic dysfunction of lumbar region: Secondary | ICD-10-CM | POA: Diagnosis not present

## 2022-11-18 DIAGNOSIS — M5442 Lumbago with sciatica, left side: Secondary | ICD-10-CM | POA: Diagnosis not present

## 2022-11-20 DIAGNOSIS — L2989 Other pruritus: Secondary | ICD-10-CM | POA: Diagnosis not present

## 2022-11-20 DIAGNOSIS — Z789 Other specified health status: Secondary | ICD-10-CM | POA: Diagnosis not present

## 2022-11-20 DIAGNOSIS — L820 Inflamed seborrheic keratosis: Secondary | ICD-10-CM | POA: Diagnosis not present

## 2022-11-20 DIAGNOSIS — L538 Other specified erythematous conditions: Secondary | ICD-10-CM | POA: Diagnosis not present

## 2022-12-11 ENCOUNTER — Encounter: Payer: Self-pay | Admitting: Gastroenterology

## 2022-12-11 ENCOUNTER — Ambulatory Visit: Payer: Medicare HMO | Admitting: Gastroenterology

## 2022-12-11 VITALS — BP 104/70 | HR 82 | Ht 63.0 in | Wt 145.0 lb

## 2022-12-11 DIAGNOSIS — R12 Heartburn: Secondary | ICD-10-CM | POA: Diagnosis not present

## 2022-12-11 DIAGNOSIS — M9901 Segmental and somatic dysfunction of cervical region: Secondary | ICD-10-CM | POA: Diagnosis not present

## 2022-12-11 DIAGNOSIS — R109 Unspecified abdominal pain: Secondary | ICD-10-CM | POA: Diagnosis not present

## 2022-12-11 DIAGNOSIS — R1013 Epigastric pain: Secondary | ICD-10-CM

## 2022-12-11 DIAGNOSIS — M9903 Segmental and somatic dysfunction of lumbar region: Secondary | ICD-10-CM | POA: Diagnosis not present

## 2022-12-11 DIAGNOSIS — M47816 Spondylosis without myelopathy or radiculopathy, lumbar region: Secondary | ICD-10-CM | POA: Diagnosis not present

## 2022-12-11 DIAGNOSIS — Z1211 Encounter for screening for malignant neoplasm of colon: Secondary | ICD-10-CM

## 2022-12-11 DIAGNOSIS — M5442 Lumbago with sciatica, left side: Secondary | ICD-10-CM | POA: Diagnosis not present

## 2022-12-11 NOTE — Patient Instructions (Signed)
Please let us know when you are ready for a colonoscopy.  _______________________________________________________  If your blood pressure at your visit was 140/90 or greater, please contact your primary care physician to follow up on this.  _______________________________________________________  If you are age 71 or older, your body mass index should be between 23-30. Your Body mass index is 25.69 kg/m. If this is out of the aforementioned range listed, please consider follow up with your Primary Care Provider.  If you are age 14 or younger, your body mass index should be between 19-25. Your Body mass index is 25.69 kg/m. If this is out of the aformentioned range listed, please consider follow up with your Primary Care Provider.   ________________________________________________________  The [place] [facility] providers would like to encourage you to use [facility] to communicate with providers for non-urgent requests or questions.  Due to long hold times on the telephone, sending your provider a message by [facility] may be a faster and more efficient way to get a response.  Please allow 48 business hours for a response.  Please remember that this is for non-urgent requests.  _______________________________________________________ It was a pleasure to see you today!  Thank you for trusting me with your gastrointestinal care!

## 2022-12-11 NOTE — Progress Notes (Signed)
[facility] Consult Note:  History: Tara Fernandez 12/11/2022  Referring provider: de Peru, Buren Kos, MD  Reason for consult/chief complaint: Abdominal Pain (Pt states she was having epigastric but now she is better, ) and Gastroesophageal Reflux (Pt states she has questions about acid reflux, and she has never had an colonoscopy)   Subjective  Prior history:  No prior GI history   Discussed the use of AI scribe software for clinical note transcription with the patient, who gave verbal consent to proceed.  History of Present Illness   The patient, a 72 year old with a recent history of abdominal pain and newly developed heartburn, presents for evaluation. The abdominal pain, located in the mid upper abdomen was severe and occurred after consuming a pint of ice cream, an unusual dietary choice for the patient. This episode of pain occurred months ago and has happened three times in the patient's life. The patient also reports occasional cramping in the left upper quadrant, described as a knot or tightness, which resolves with massage.  Over the past year, the patient has noticed the onset of intermittent heartburn. This was a new symptom for the patient, initially causing some concern. The patient was prescribed acid-reducing medication (omeprazole) but opted for natural remedies instead, including apple cider vinegar powder, turmeric, ginger, and coconut water, which have been effective in managing the heartburn. She denies dysphagia or odynophagia. The patient also reports a sensation of feeling full more quickly than expected during meals, despite still feeling hungry. This feeling of early satiety is a new symptom for the patient. There have been no associated symptoms of nausea, vomiting, or unexpected weight loss. Bowel habits are reported as normal, with occasional use of natural remedies for constipation. The patient has had negative Cologuard tests for  colorectal cancer screening, the most recent of which was in June of the current year. Despite the negative results, the patient expresses a desire for a colonoscopy for further peace of mind.       ROS:  Review of Systems  Constitutional:  Negative for appetite change and unexpected weight change.  HENT:  Negative for mouth sores and voice change.   Eyes:  Negative for pain and redness.  Respiratory:  Negative for cough and shortness of breath.   Cardiovascular:  Negative for chest pain and palpitations.  Genitourinary:  Negative for dysuria and hematuria.  Musculoskeletal:  Positive for arthralgias. Negative for myalgias.  Skin:  Negative for pallor and rash.  Neurological:  Negative for weakness and headaches.  Hematological:  Negative for adenopathy.     Past Medical History: Past Medical History:  Diagnosis Date   Arthritis    osteoarthritis; PAIN RIGHT HIP   PONV (postoperative nausea and vomiting)      Past Surgical History: Past Surgical History:  Procedure Laterality Date   ANTERIOR AND POSTERIOR REPAIR N/A 03/05/2015   Procedure: ANTERIOR (CYSTOCELE) REPAIR, VAULT PROLAPSE AND GRAFT;  Surgeon: Alfredo Martinez, MD;  Location: [facility];  Service: Urology;  Laterality: N/A;   BREAST MASS EXCISION     Hx: of left breast   CARTILAGE SURGERY  10 th grade   RIGHT KNEE   CYSTOSCOPY N/A 03/05/2015   Procedure: CYSTOSCOPY;  Surgeon: Alfredo Martinez, MD;  Location: [facility];  Service: Urology;  Laterality: N/A;   LAPAROSCOPIC VAGINAL HYSTERECTOMY WITH SALPINGECTOMY Bilateral 03/05/2015   Procedure: LAPAROSCOPIC ASSISTED VAGINAL HYSTERECTOMY WITH SALPINGECTOMY;  Surgeon: Jerene Bears, MD;  Location: [facility];  Service: Gynecology;  Laterality:  Bilateral;   TOTAL HIP ARTHROPLASTY Left 06/17/2012   Procedure: LEFT TOTAL HIP ARTHROPLASTY ANTERIOR APPROACH;  Surgeon: Kathryne Hitch, MD;  Location: [facility];  Service: Orthopedics;  Laterality: Left;   TOTAL HIP ARTHROPLASTY Right  02/11/2013   Procedure: RIGHT TOTAL HIP ARTHROPLASTY ANTERIOR APPROACH;  Surgeon: Kathryne Hitch, MD;  Location: [facility];  Service: Orthopedics;  Laterality: Right;   TOTAL KNEE ARTHROPLASTY Right 02/18/2021   Procedure: TOTAL KNEE ARTHROPLASTY;  Surgeon: Durene Romans, MD;  Location: [facility];  Service: Orthopedics;  Laterality: Right;     Family History: Family History  Problem Relation Age of Onset   Heart attack Father    Cancer Father    Tongue cancer Father    Breast cancer Sister 27   Lung cancer Sister    Cancer Sister    Lung cancer Mother    Cancer Mother    Lupus Sister    Cancer Sister    Breast cancer Sister    Healthy Brother    Cancer Maternal Aunt    Cancer Maternal Uncle    Cancer Paternal Aunt    Cancer Paternal Uncle    Healthy Brother    Diabetes Neg Hx    Hypertension Neg Hx    Thyroid disease Neg Hx     Social History: Social History   Socioeconomic History   Marital status: Married    Spouse name: Not on file   Number of children: Not on file   Years of education: Not on file   Highest education level: Not on file  Occupational History   Not on file  Tobacco Use   Smoking status: Never   Smokeless tobacco: Never  Vaping Use   Vaping status: Never Used  Substance and Sexual Activity   Alcohol use: Not Currently    Comment: occ   Drug use: No   Sexual activity: Yes    Partners: Male    Birth control/protection: Post-menopausal, Surgical    Comment: hysterectomy  Other Topics Concern   Not on file  Social History Narrative   Not on file   Social Determinants of Health   Financial Resource Strain: Low Risk  (06/03/2022)   Overall Financial Resource Strain (CARDIA)    Difficulty of Paying Living Expenses: Not hard at all  Food Insecurity: No Food Insecurity (06/03/2022)   Hunger Vital Sign    Worried About Running Out of Food in the Last Year: Never true    Ran Out of Food in the Last Year: Never true  Transportation Needs: No  Transportation Needs (06/03/2022)   PRAPARE - Administrator, Civil Service (Medical): No    Lack of Transportation (Non-Medical): No  Physical Activity: Sufficiently Active (06/03/2022)   Exercise Vital Sign    Days of Exercise per Week: 7 days    Minutes of Exercise per Session: 30 min  Stress: No Stress Concern Present (06/03/2022)   [employer] of Occupational Health - Occupational Stress Questionnaire    Feeling of Stress : Not at all  Social Connections: Socially Integrated (06/03/2022)   Social Connection and Isolation Panel [NHANES]    Frequency of Communication with Friends and Family: More than three times a week    Frequency of Social Gatherings with Friends and Family: More than three times a week    Attends Religious Services: More than 4 times per year    Active Member of [employer] or Organizations: Yes    Attends Banker Meetings:  More than 4 times per year    Marital Status: Married    Allergies: Allergies  Allergen Reactions   Tramadol Other (See Comments)    Causes numbness in hands and right side of body   Bactrim [Sulfamethoxazole-Trimethoprim]     Pt unsure of reaction    Chlorhexidine Itching   Other     PT STATES SHE CAN TAKE HYDROCODONE/ACETAMINOPHEN FOR PAIN  WITH OUT ANY PROBLEMS.  STATES - IN THE PAST - OTHER PAIN MEDS CAUSED NAUSEA OR RASH OR FAST HEART BEAT - BUT SHE DOESN'T KNOW NAMES OF THOSE MEDS   STATES NO PROBLEMS WITH ANY PAIN MEDS GIVEN IN HOSPITAL.   Premarin [Conjugated Estrogens] Other (See Comments)    Headache, dizziness, and pelvic cramping.   Zithromax [Azithromycin] Swelling    Area around eyes swollen and red   Ciprofloxacin Hives, Itching and Rash    "joint pain", patient reports hives, rash, and itching   Latex Rash    HIVES - AFTER DENTIST USED LATEX GLOVES    Outpatient Meds: Current Outpatient Medications  Medication Sig Dispense Refill   Ascorbic Acid (VITAMIN C) 1000 MG tablet Take 1,000 mg by mouth  daily.     B Complex-C (B-COMPLEX WITH VITAMIN C) tablet Take 1 tablet by mouth daily.     Boswellia Serrata (BOSWELLIA PO) Take 1 capsule by mouth in the morning and at bedtime.     cholecalciferol 25 MCG (1000 UT) tablet 1 tablet Orally once a day if needed     ELDERBERRY PO Take 1 capsule by mouth daily.     Nutritional Supplements (BEE POLLEN/ROYAL JELLY/HONEY PO) Take 1 capsule by mouth daily.     omeprazole (PRILOSEC) 20 MG capsule Take 1 capsule (20 mg total) by mouth daily. 60 capsule 1   RED CLOVER LEAF EXTRACT PO Take 1 capsule by mouth daily.     S-Adenosylmethionine (SAM-E PO) Take 1 capsule by mouth daily.     TURMERIC-GINGER PO Take 1 capsule by mouth daily.     urea (CARMOL) 10 % cream Apply topically at bedtime. Apply to tips of left 2nd and 3rd toes QHS 71 g 0   triamcinolone cream (KENALOG) 0.5 % Apply topically 2 (two) times daily. (Patient not taking: Reported on 12/11/2022)     No current facility-administered medications for this visit.      ___________________________________________________________________ Objective   Exam:  BP 104/70   Pulse 82   Ht 5\' 3"  (1.6 m)   Wt 145 lb (65.8 kg)   LMP 01/07/1996 (Approximate)   BMI 25.69 kg/m  Wt Readings from Last 3 Encounters:  12/11/22 145 lb (65.8 kg)  10/27/22 140 lb (63.5 kg)  06/09/22 141 lb (64 kg)    General: Well-appearing Eyes: sclera anicteric, no redness ENT: oral mucosa moist without lesions, no cervical or supraclavicular lymphadenopathy CV: Regular without appreciable murmur, no JVD, no peripheral edema Resp: clear to auscultation bilaterally, normal RR and effort noted GI: soft, no tenderness, with active bowel sounds. No guarding or palpable organomegaly noted.  No bruit Skin; warm and dry, no rash or jaundice noted Neuro: awake, alert and oriented x 3. Normal gross motor function and fluent speech   Labs:  Negative cologuard June 2024      Latest Ref Rng & Units 02/04/2022    2:14 PM  02/19/2021    3:41 AM 02/05/2021   10:17 AM  CBC  WBC 3.4 - 10.8 x10E3/uL 6.4  11.9  5.7  Hemoglobin 11.1 - 15.9 g/dL 29.5  28.4  13.2   Hematocrit 34.0 - 46.6 % 41.0  33.9  43.1   Platelets 150 - 450 x10E3/uL 235  189  249       Latest Ref Rng & Units 02/04/2022    2:14 PM 02/19/2021    3:41 AM 02/05/2021   10:17 AM  CMP  Glucose 70 - 99 mg/dL 92  440  90   BUN 8 - 27 mg/dL 13  11  12    Creatinine 0.57 - 1.00 mg/dL 1.02  7.25  3.66   Sodium 134 - 144 mmol/L 138  134  138   Potassium 3.5 - 5.2 mmol/L 4.1  4.1  4.3   Chloride 96 - 106 mmol/L 100  105  105   CO2 20 - 29 mmol/L 23  25  25    Calcium 8.7 - 10.3 mg/dL 9.4  8.1  8.9   Total Protein 6.0 - 8.5 g/dL 6.2   6.4   Total Bilirubin 0.0 - 1.2 mg/dL 0.5   0.6   Alkaline Phos 44 - 121 IU/L 74   49   AST 0 - 40 IU/L 22   23   ALT 0 - 32 IU/L 21   21       Encounter Diagnoses  Name Primary?   Epigastric pain Yes   Special screening for malignant neoplasms, colon    Heartburn     Assessment and Plan    Heartburn New onset of heartburn symptoms in the past year. Patient has been managing symptoms with natural remedies including apple cider vinegar and turmeric. No no red flag symptoms in the way of dysphagia, nausea, vomiting, or unexpected weight loss. -Continue current management with natural remedies as it is effective for the patient. Let me know if something changes in particular seems to be worsening.  Abdominal Pain Infrequent episodes of lower abdominal pain, possibly related to dietary intake. No recent episodes. Not clear what this has come from on the few occasions that it occurred, but could be biliary colic.  I recommended an ultrasound of the gallbladder.  She was agreeable would like to wait until after her shoulder surgery in a few weeks, so think she will probably contact us after the new year when she is ready to get the ultrasound done.   Colon Cancer Screening Negative Cologuard test in June 2024. We  discussed the usual performance of Cologuard for screening and my opinion that a colonoscopy is a more comprehensive colorectal cancer and polyp screening tool.  Patient expressed desire for colonoscopy for further peace of mind despite negative Cologuard test. -Schedule colonoscopy when patient is ready, considering recovery from upcoming right shoulder surgery.  The benefits and risks of the planned procedure were described in detail with the patient or (when appropriate) their health care proxy.  Risks were outlined as including, but not limited to, bleeding, infection, perforation, adverse medication reaction leading to cardiac or pulmonary decompensation, pancreatitis (if ERCP).  The limitation of incomplete mucosal visualization was also discussed.  No guarantees or warranties were given.   Thank you for the courtesy of this consult.  Please call me with any questions or concerns.  Charlie Pitter III  CC: Referring provider noted above

## 2022-12-12 ENCOUNTER — Other Ambulatory Visit: Payer: Self-pay

## 2022-12-12 DIAGNOSIS — R1013 Epigastric pain: Secondary | ICD-10-CM

## 2022-12-12 NOTE — Telephone Encounter (Signed)
Yes, please schedule a RUQ ultrasound for: episodic epigastric pain, r/o gallstones.  - H. Danis

## 2022-12-15 DIAGNOSIS — M25561 Pain in right knee: Secondary | ICD-10-CM | POA: Diagnosis not present

## 2022-12-15 DIAGNOSIS — M25661 Stiffness of right knee, not elsewhere classified: Secondary | ICD-10-CM | POA: Diagnosis not present

## 2022-12-15 DIAGNOSIS — M62561 Muscle wasting and atrophy, not elsewhere classified, right lower leg: Secondary | ICD-10-CM | POA: Diagnosis not present

## 2022-12-15 DIAGNOSIS — R262 Difficulty in walking, not elsewhere classified: Secondary | ICD-10-CM | POA: Diagnosis not present

## 2022-12-16 ENCOUNTER — Other Ambulatory Visit: Payer: Self-pay | Admitting: Endocrinology

## 2022-12-16 ENCOUNTER — Ambulatory Visit (HOSPITAL_COMMUNITY)
Admission: RE | Admit: 2022-12-16 | Discharge: 2022-12-16 | Disposition: A | Discharge status: Home / Self Care | Payer: Medicare HMO | Source: Ambulatory Visit | Attending: Gastroenterology | Admitting: Gastroenterology

## 2022-12-16 DIAGNOSIS — R1013 Epigastric pain: Secondary | ICD-10-CM | POA: Insufficient documentation

## 2022-12-16 DIAGNOSIS — K7689 Other specified diseases of liver: Secondary | ICD-10-CM | POA: Diagnosis not present

## 2022-12-16 DIAGNOSIS — E041 Nontoxic single thyroid nodule: Secondary | ICD-10-CM

## 2022-12-16 DIAGNOSIS — N1330 Unspecified hydronephrosis: Secondary | ICD-10-CM | POA: Diagnosis not present

## 2022-12-17 ENCOUNTER — Other Ambulatory Visit (HOSPITAL_BASED_OUTPATIENT_CLINIC_OR_DEPARTMENT_OTHER): Payer: Self-pay | Admitting: Family Medicine

## 2022-12-18 DIAGNOSIS — M47816 Spondylosis without myelopathy or radiculopathy, lumbar region: Secondary | ICD-10-CM | POA: Diagnosis not present

## 2022-12-18 DIAGNOSIS — M9901 Segmental and somatic dysfunction of cervical region: Secondary | ICD-10-CM | POA: Diagnosis not present

## 2022-12-18 DIAGNOSIS — M9903 Segmental and somatic dysfunction of lumbar region: Secondary | ICD-10-CM | POA: Diagnosis not present

## 2022-12-18 DIAGNOSIS — M5442 Lumbago with sciatica, left side: Secondary | ICD-10-CM | POA: Diagnosis not present

## 2022-12-22 ENCOUNTER — Ambulatory Visit (HOSPITAL_BASED_OUTPATIENT_CLINIC_OR_DEPARTMENT_OTHER): Payer: Medicare HMO | Admitting: Family Medicine

## 2022-12-23 DIAGNOSIS — M19011 Primary osteoarthritis, right shoulder: Secondary | ICD-10-CM | POA: Diagnosis not present

## 2022-12-23 DIAGNOSIS — G8918 Other acute postprocedural pain: Secondary | ICD-10-CM | POA: Diagnosis not present

## 2022-12-23 HISTORY — PX: OTHER SURGICAL HISTORY: SHX169

## 2022-12-24 ENCOUNTER — Other Ambulatory Visit: Payer: Self-pay

## 2022-12-24 DIAGNOSIS — N1330 Unspecified hydronephrosis: Secondary | ICD-10-CM

## 2022-12-24 DIAGNOSIS — R109 Unspecified abdominal pain: Secondary | ICD-10-CM

## 2023-01-05 DIAGNOSIS — Z471 Aftercare following joint replacement surgery: Secondary | ICD-10-CM | POA: Diagnosis not present

## 2023-01-05 DIAGNOSIS — Z96611 Presence of right artificial shoulder joint: Secondary | ICD-10-CM | POA: Diagnosis not present

## 2023-01-13 ENCOUNTER — Ambulatory Visit
Admission: RE | Admit: 2023-01-13 | Discharge: 2023-01-13 | Disposition: A | Discharge status: Home / Self Care | Payer: Medicare HMO | Source: Ambulatory Visit | Attending: Endocrinology | Admitting: Endocrinology

## 2023-01-13 DIAGNOSIS — E041 Nontoxic single thyroid nodule: Secondary | ICD-10-CM

## 2023-01-13 DIAGNOSIS — E042 Nontoxic multinodular goiter: Secondary | ICD-10-CM | POA: Diagnosis not present

## 2023-01-14 ENCOUNTER — Ambulatory Visit (HOSPITAL_COMMUNITY)
Admission: RE | Admit: 2023-01-14 | Discharge: 2023-01-14 | Disposition: A | Discharge status: Home / Self Care | Payer: Medicare HMO | Source: Ambulatory Visit | Attending: Gastroenterology | Admitting: Gastroenterology

## 2023-01-14 DIAGNOSIS — N1330 Unspecified hydronephrosis: Secondary | ICD-10-CM | POA: Insufficient documentation

## 2023-01-14 DIAGNOSIS — K7689 Other specified diseases of liver: Secondary | ICD-10-CM | POA: Diagnosis not present

## 2023-01-14 DIAGNOSIS — R109 Unspecified abdominal pain: Secondary | ICD-10-CM | POA: Insufficient documentation

## 2023-01-15 DIAGNOSIS — M9901 Segmental and somatic dysfunction of cervical region: Secondary | ICD-10-CM | POA: Diagnosis not present

## 2023-01-15 DIAGNOSIS — M47816 Spondylosis without myelopathy or radiculopathy, lumbar region: Secondary | ICD-10-CM | POA: Diagnosis not present

## 2023-01-15 DIAGNOSIS — M9903 Segmental and somatic dysfunction of lumbar region: Secondary | ICD-10-CM | POA: Diagnosis not present

## 2023-01-15 DIAGNOSIS — M5442 Lumbago with sciatica, left side: Secondary | ICD-10-CM | POA: Diagnosis not present

## 2023-01-20 DIAGNOSIS — M5442 Lumbago with sciatica, left side: Secondary | ICD-10-CM | POA: Diagnosis not present

## 2023-01-20 DIAGNOSIS — M47816 Spondylosis without myelopathy or radiculopathy, lumbar region: Secondary | ICD-10-CM | POA: Diagnosis not present

## 2023-01-20 DIAGNOSIS — M9903 Segmental and somatic dysfunction of lumbar region: Secondary | ICD-10-CM | POA: Diagnosis not present

## 2023-01-20 DIAGNOSIS — M9901 Segmental and somatic dysfunction of cervical region: Secondary | ICD-10-CM | POA: Diagnosis not present

## 2023-01-21 DIAGNOSIS — M25511 Pain in right shoulder: Secondary | ICD-10-CM | POA: Diagnosis not present

## 2023-01-21 DIAGNOSIS — M25611 Stiffness of right shoulder, not elsewhere classified: Secondary | ICD-10-CM | POA: Diagnosis not present

## 2023-01-21 DIAGNOSIS — R29898 Other symptoms and signs involving the musculoskeletal system: Secondary | ICD-10-CM | POA: Diagnosis not present

## 2023-01-22 DIAGNOSIS — M5442 Lumbago with sciatica, left side: Secondary | ICD-10-CM | POA: Diagnosis not present

## 2023-01-22 DIAGNOSIS — M47816 Spondylosis without myelopathy or radiculopathy, lumbar region: Secondary | ICD-10-CM | POA: Diagnosis not present

## 2023-01-22 DIAGNOSIS — M9901 Segmental and somatic dysfunction of cervical region: Secondary | ICD-10-CM | POA: Diagnosis not present

## 2023-01-22 DIAGNOSIS — M9903 Segmental and somatic dysfunction of lumbar region: Secondary | ICD-10-CM | POA: Diagnosis not present

## 2023-01-26 DIAGNOSIS — M25611 Stiffness of right shoulder, not elsewhere classified: Secondary | ICD-10-CM | POA: Diagnosis not present

## 2023-01-26 DIAGNOSIS — R29898 Other symptoms and signs involving the musculoskeletal system: Secondary | ICD-10-CM | POA: Diagnosis not present

## 2023-01-26 DIAGNOSIS — M25511 Pain in right shoulder: Secondary | ICD-10-CM | POA: Diagnosis not present

## 2023-01-26 DIAGNOSIS — E039 Hypothyroidism, unspecified: Secondary | ICD-10-CM | POA: Diagnosis not present

## 2023-01-26 DIAGNOSIS — E041 Nontoxic single thyroid nodule: Secondary | ICD-10-CM | POA: Diagnosis not present

## 2023-01-26 DIAGNOSIS — Z780 Asymptomatic menopausal state: Secondary | ICD-10-CM | POA: Diagnosis not present

## 2023-01-27 ENCOUNTER — Other Ambulatory Visit: Payer: Self-pay | Admitting: Endocrinology

## 2023-01-27 DIAGNOSIS — E041 Nontoxic single thyroid nodule: Secondary | ICD-10-CM

## 2023-01-27 DIAGNOSIS — R262 Difficulty in walking, not elsewhere classified: Secondary | ICD-10-CM | POA: Diagnosis not present

## 2023-01-27 DIAGNOSIS — M9903 Segmental and somatic dysfunction of lumbar region: Secondary | ICD-10-CM | POA: Diagnosis not present

## 2023-01-27 DIAGNOSIS — M62561 Muscle wasting and atrophy, not elsewhere classified, right lower leg: Secondary | ICD-10-CM | POA: Diagnosis not present

## 2023-01-27 DIAGNOSIS — M9901 Segmental and somatic dysfunction of cervical region: Secondary | ICD-10-CM | POA: Diagnosis not present

## 2023-01-27 DIAGNOSIS — M25561 Pain in right knee: Secondary | ICD-10-CM | POA: Diagnosis not present

## 2023-01-27 DIAGNOSIS — M47816 Spondylosis without myelopathy or radiculopathy, lumbar region: Secondary | ICD-10-CM | POA: Diagnosis not present

## 2023-01-27 DIAGNOSIS — M25661 Stiffness of right knee, not elsewhere classified: Secondary | ICD-10-CM | POA: Diagnosis not present

## 2023-01-27 DIAGNOSIS — M5442 Lumbago with sciatica, left side: Secondary | ICD-10-CM | POA: Diagnosis not present

## 2023-02-02 DIAGNOSIS — L538 Other specified erythematous conditions: Secondary | ICD-10-CM | POA: Diagnosis not present

## 2023-02-02 DIAGNOSIS — Z789 Other specified health status: Secondary | ICD-10-CM | POA: Diagnosis not present

## 2023-02-02 DIAGNOSIS — D485 Neoplasm of uncertain behavior of skin: Secondary | ICD-10-CM | POA: Diagnosis not present

## 2023-02-02 DIAGNOSIS — R208 Other disturbances of skin sensation: Secondary | ICD-10-CM | POA: Diagnosis not present

## 2023-02-02 DIAGNOSIS — L821 Other seborrheic keratosis: Secondary | ICD-10-CM | POA: Diagnosis not present

## 2023-02-02 DIAGNOSIS — D1801 Hemangioma of skin and subcutaneous tissue: Secondary | ICD-10-CM | POA: Diagnosis not present

## 2023-02-02 DIAGNOSIS — M25611 Stiffness of right shoulder, not elsewhere classified: Secondary | ICD-10-CM | POA: Diagnosis not present

## 2023-02-02 DIAGNOSIS — R29898 Other symptoms and signs involving the musculoskeletal system: Secondary | ICD-10-CM | POA: Diagnosis not present

## 2023-02-02 DIAGNOSIS — B078 Other viral warts: Secondary | ICD-10-CM | POA: Diagnosis not present

## 2023-02-02 DIAGNOSIS — L820 Inflamed seborrheic keratosis: Secondary | ICD-10-CM | POA: Diagnosis not present

## 2023-02-02 DIAGNOSIS — M25511 Pain in right shoulder: Secondary | ICD-10-CM | POA: Diagnosis not present

## 2023-02-02 DIAGNOSIS — L814 Other melanin hyperpigmentation: Secondary | ICD-10-CM | POA: Diagnosis not present

## 2023-02-03 ENCOUNTER — Other Ambulatory Visit: Payer: Medicare HMO

## 2023-02-05 DIAGNOSIS — M47816 Spondylosis without myelopathy or radiculopathy, lumbar region: Secondary | ICD-10-CM | POA: Diagnosis not present

## 2023-02-05 DIAGNOSIS — R29898 Other symptoms and signs involving the musculoskeletal system: Secondary | ICD-10-CM | POA: Diagnosis not present

## 2023-02-05 DIAGNOSIS — M25611 Stiffness of right shoulder, not elsewhere classified: Secondary | ICD-10-CM | POA: Diagnosis not present

## 2023-02-05 DIAGNOSIS — M5442 Lumbago with sciatica, left side: Secondary | ICD-10-CM | POA: Diagnosis not present

## 2023-02-05 DIAGNOSIS — M25511 Pain in right shoulder: Secondary | ICD-10-CM | POA: Diagnosis not present

## 2023-02-05 DIAGNOSIS — M9903 Segmental and somatic dysfunction of lumbar region: Secondary | ICD-10-CM | POA: Diagnosis not present

## 2023-02-05 DIAGNOSIS — M9901 Segmental and somatic dysfunction of cervical region: Secondary | ICD-10-CM | POA: Diagnosis not present

## 2023-02-06 DIAGNOSIS — Z6825 Body mass index (BMI) 25.0-25.9, adult: Secondary | ICD-10-CM | POA: Diagnosis not present

## 2023-02-06 DIAGNOSIS — R635 Abnormal weight gain: Secondary | ICD-10-CM | POA: Diagnosis not present

## 2023-02-09 DIAGNOSIS — Z96611 Presence of right artificial shoulder joint: Secondary | ICD-10-CM | POA: Diagnosis not present

## 2023-02-10 ENCOUNTER — Telehealth (HOSPITAL_BASED_OUTPATIENT_CLINIC_OR_DEPARTMENT_OTHER): Payer: Self-pay | Admitting: *Deleted

## 2023-02-10 DIAGNOSIS — M2624 Reverse articulation: Secondary | ICD-10-CM | POA: Diagnosis not present

## 2023-02-10 DIAGNOSIS — Z4731 Aftercare following explantation of shoulder joint prosthesis: Secondary | ICD-10-CM | POA: Diagnosis not present

## 2023-02-10 DIAGNOSIS — M25519 Pain in unspecified shoulder: Secondary | ICD-10-CM | POA: Diagnosis not present

## 2023-02-10 NOTE — Telephone Encounter (Signed)
 noted

## 2023-02-10 NOTE — Telephone Encounter (Signed)
 Copied from CRM [id number]. Topic: General - Other >> Feb 10, 2023  8:11 AM Benton O wrote: Reason for CRM: patient is calling cause she said appointment for next Monday at 10:50 she will keep and would like to be taking offf the waitlist or cancellation list

## 2023-02-12 ENCOUNTER — Telehealth (HOSPITAL_BASED_OUTPATIENT_CLINIC_OR_DEPARTMENT_OTHER): Payer: Self-pay | Admitting: *Deleted

## 2023-02-12 DIAGNOSIS — M25519 Pain in unspecified shoulder: Secondary | ICD-10-CM | POA: Diagnosis not present

## 2023-02-12 DIAGNOSIS — M2624 Reverse articulation: Secondary | ICD-10-CM | POA: Diagnosis not present

## 2023-02-12 DIAGNOSIS — Z4731 Aftercare following explantation of shoulder joint prosthesis: Secondary | ICD-10-CM | POA: Diagnosis not present

## 2023-02-12 NOTE — Telephone Encounter (Signed)
 LVM for pt to call the office regarding appt on 2/10 need to move this per provider

## 2023-02-15 IMAGING — DX DG SHOULDER 2+V*R*
4 series · 4 of 4 positions shown · non-contrast
Comparison: None.

CLINICAL DATA: Right shoulder pain after fall.

EXAM:
RIGHT SHOULDER - 2+ VIEW

[shoulder grashey]
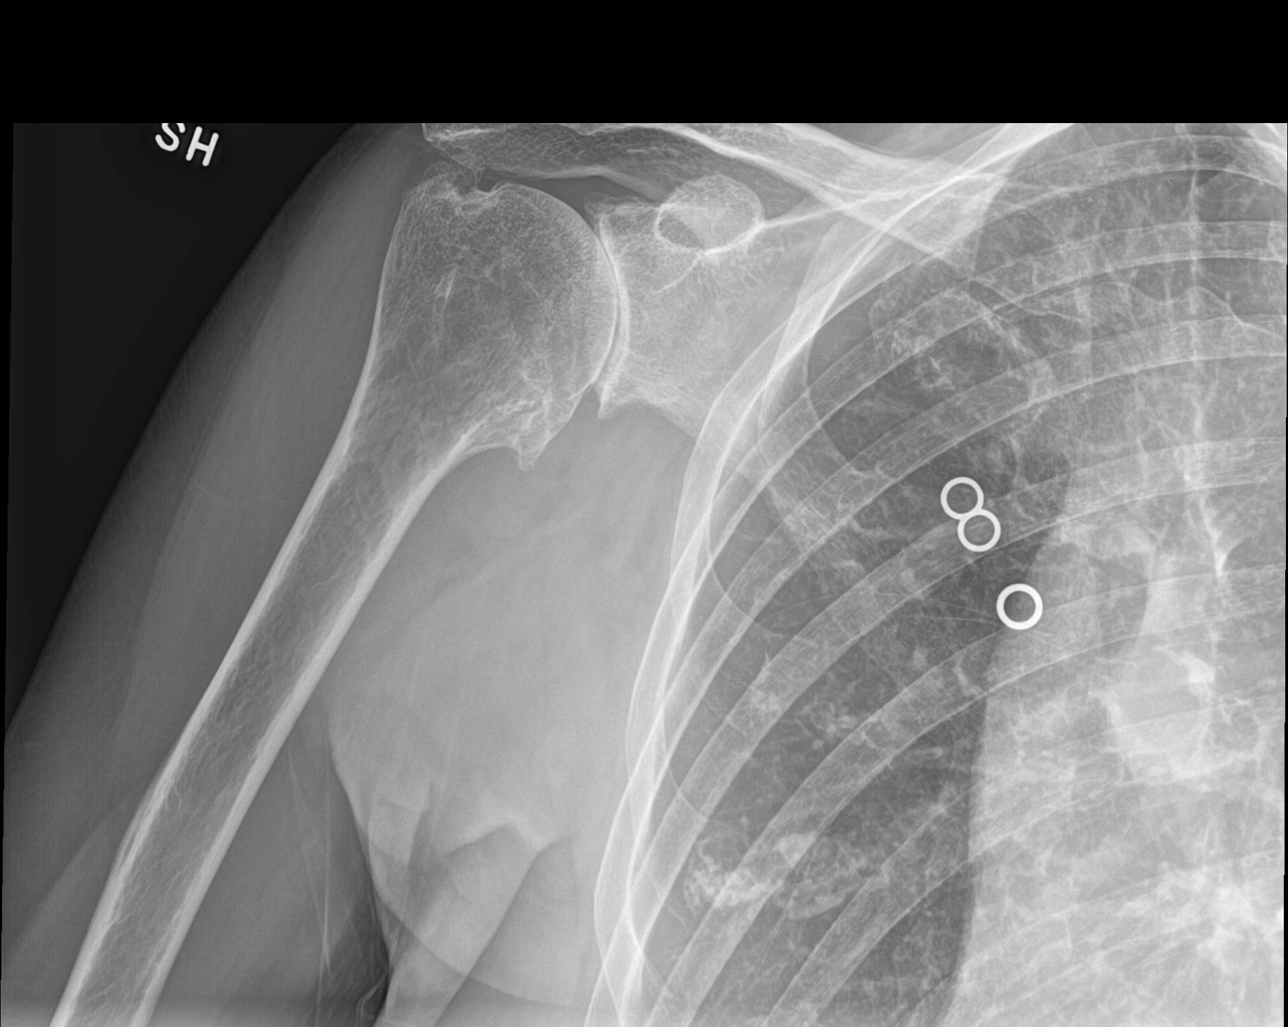

[shoulder y view (1 of 2)]
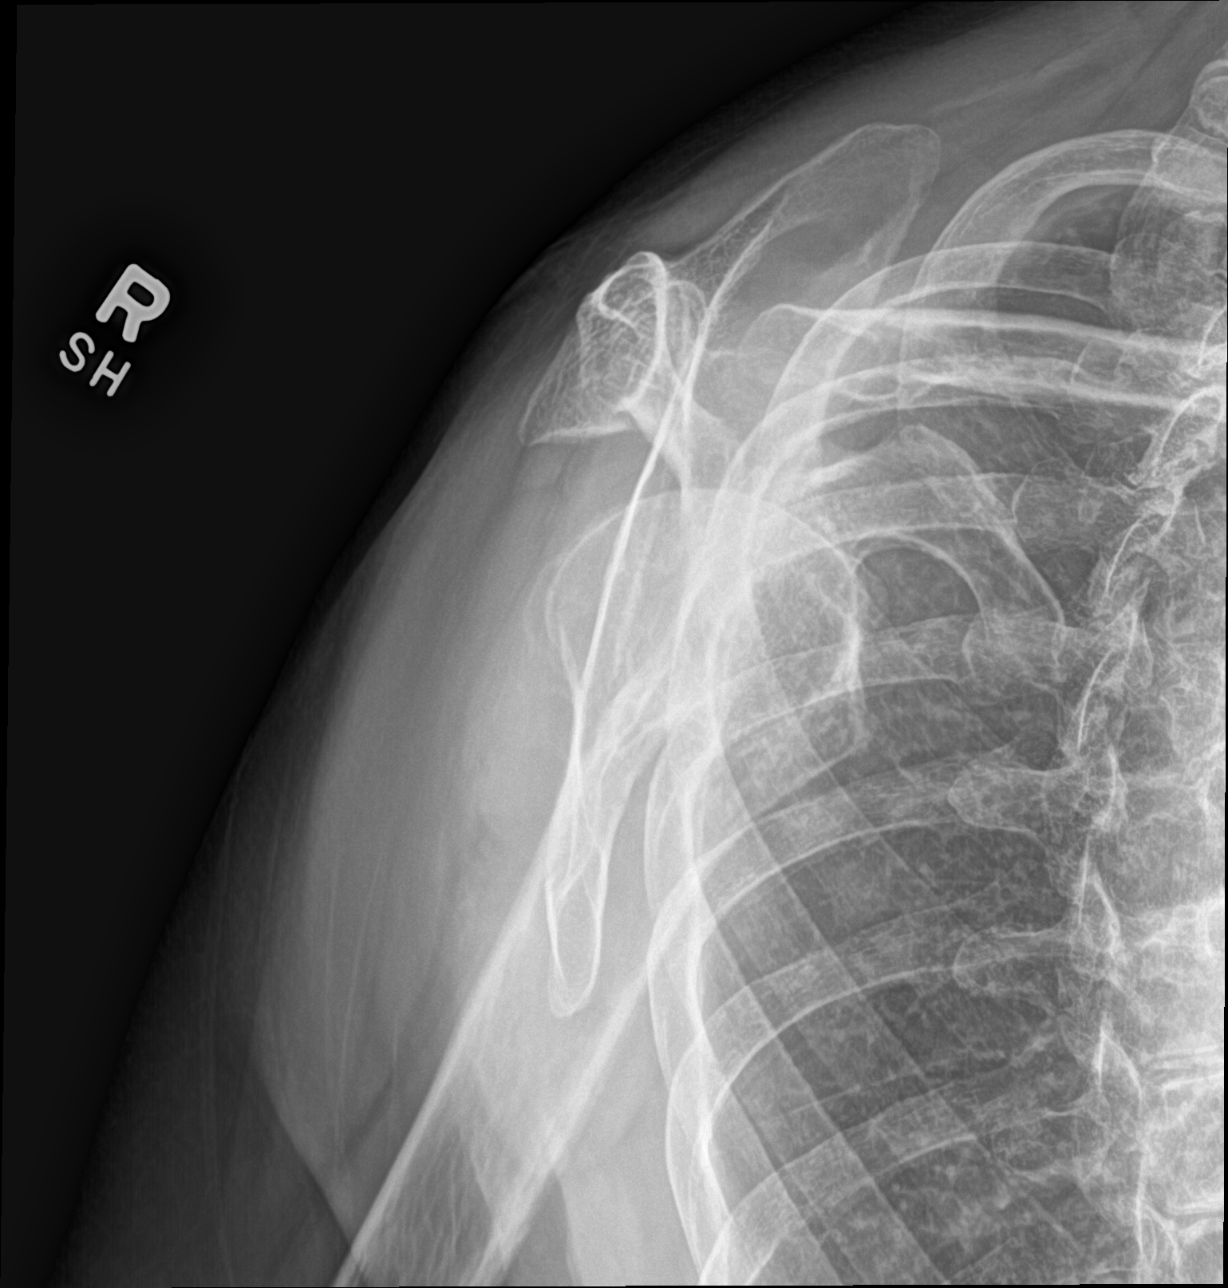

[shoulder axillary]
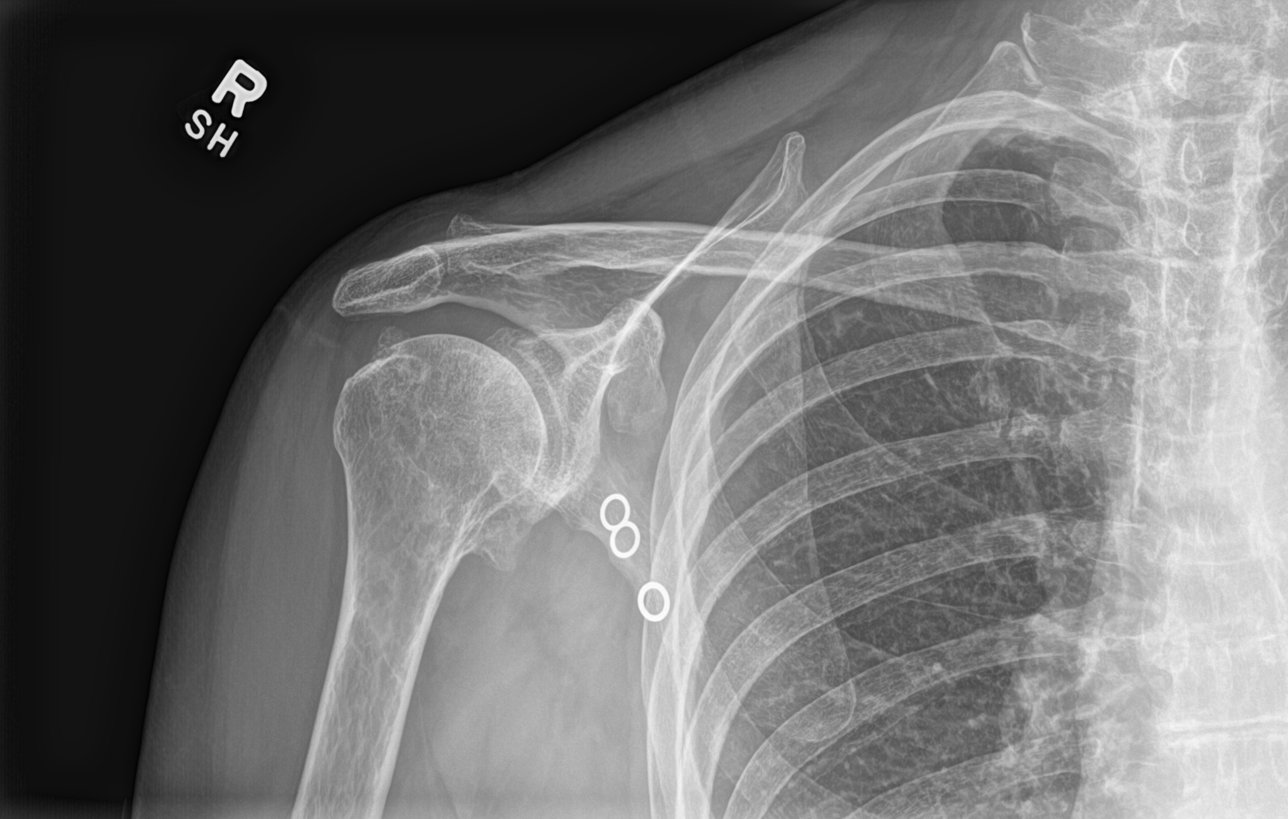

[shoulder y view (2 of 2)]
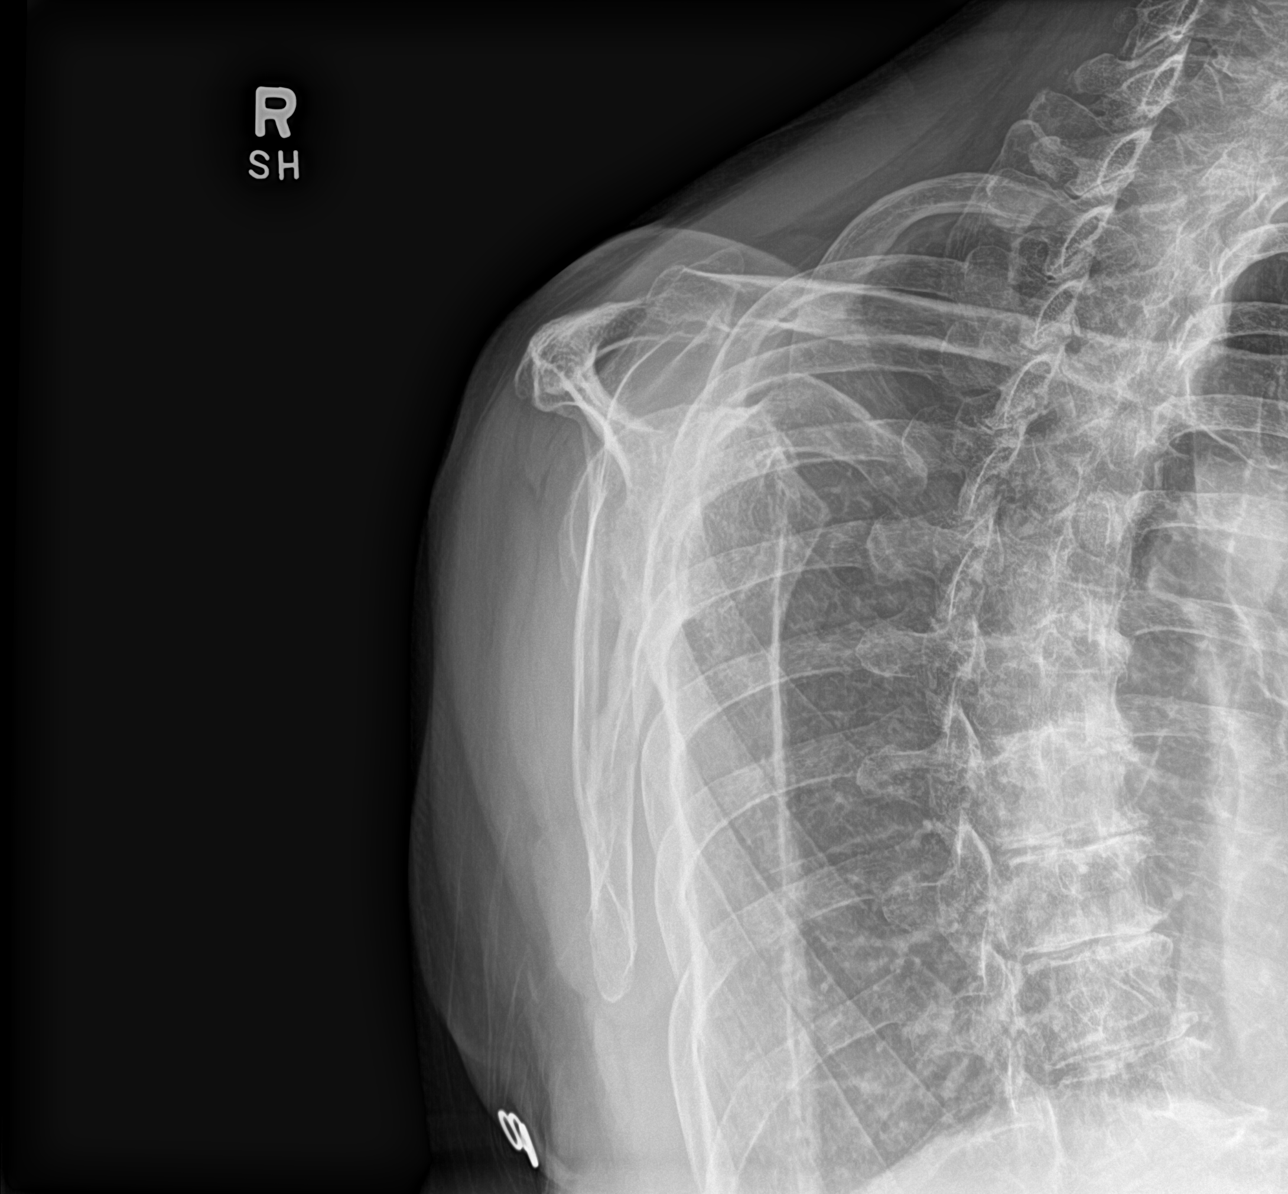

[4 of 4 positions shown; findings below may reference images not displayed]

FINDINGS: There is no evidence of fracture or dislocation. Severe narrowing
and osteophyte formation is seen involving the right glenohumeral
joint. Hill-Sachs deformity is seen involving the lateral portion of
the proximal right humeral head. Soft tissues are unremarkable.
IMPRESSION: Severe osteoarthritis of the right glenohumeral joint. No acute
abnormality seen.

## 2023-02-16 ENCOUNTER — Ambulatory Visit (HOSPITAL_BASED_OUTPATIENT_CLINIC_OR_DEPARTMENT_OTHER): Payer: Medicare HMO | Admitting: Family Medicine

## 2023-02-16 DIAGNOSIS — Z4731 Aftercare following explantation of shoulder joint prosthesis: Secondary | ICD-10-CM | POA: Diagnosis not present

## 2023-02-16 DIAGNOSIS — M2624 Reverse articulation: Secondary | ICD-10-CM | POA: Diagnosis not present

## 2023-02-16 DIAGNOSIS — M25519 Pain in unspecified shoulder: Secondary | ICD-10-CM | POA: Diagnosis not present

## 2023-02-17 DIAGNOSIS — G479 Sleep disorder, unspecified: Secondary | ICD-10-CM | POA: Diagnosis not present

## 2023-02-17 DIAGNOSIS — R6882 Decreased libido: Secondary | ICD-10-CM | POA: Diagnosis not present

## 2023-02-17 DIAGNOSIS — M47816 Spondylosis without myelopathy or radiculopathy, lumbar region: Secondary | ICD-10-CM | POA: Diagnosis not present

## 2023-02-17 DIAGNOSIS — M9901 Segmental and somatic dysfunction of cervical region: Secondary | ICD-10-CM | POA: Diagnosis not present

## 2023-02-17 DIAGNOSIS — E663 Overweight: Secondary | ICD-10-CM | POA: Diagnosis not present

## 2023-02-17 DIAGNOSIS — M5442 Lumbago with sciatica, left side: Secondary | ICD-10-CM | POA: Diagnosis not present

## 2023-02-17 DIAGNOSIS — Z1331 Encounter for screening for depression: Secondary | ICD-10-CM | POA: Diagnosis not present

## 2023-02-17 DIAGNOSIS — N951 Menopausal and female climacteric states: Secondary | ICD-10-CM | POA: Diagnosis not present

## 2023-02-17 DIAGNOSIS — M9903 Segmental and somatic dysfunction of lumbar region: Secondary | ICD-10-CM | POA: Diagnosis not present

## 2023-02-19 DIAGNOSIS — M25519 Pain in unspecified shoulder: Secondary | ICD-10-CM | POA: Diagnosis not present

## 2023-02-19 DIAGNOSIS — M2624 Reverse articulation: Secondary | ICD-10-CM | POA: Diagnosis not present

## 2023-02-19 DIAGNOSIS — Z4731 Aftercare following explantation of shoulder joint prosthesis: Secondary | ICD-10-CM | POA: Diagnosis not present

## 2023-02-23 DIAGNOSIS — M25519 Pain in unspecified shoulder: Secondary | ICD-10-CM | POA: Diagnosis not present

## 2023-02-23 DIAGNOSIS — Z4731 Aftercare following explantation of shoulder joint prosthesis: Secondary | ICD-10-CM | POA: Diagnosis not present

## 2023-02-23 DIAGNOSIS — M2624 Reverse articulation: Secondary | ICD-10-CM | POA: Diagnosis not present

## 2023-02-24 ENCOUNTER — Ambulatory Visit (INDEPENDENT_AMBULATORY_CARE_PROVIDER_SITE_OTHER): Payer: Medicare HMO | Admitting: Family Medicine

## 2023-02-24 ENCOUNTER — Encounter (HOSPITAL_BASED_OUTPATIENT_CLINIC_OR_DEPARTMENT_OTHER): Payer: Self-pay | Admitting: Family Medicine

## 2023-02-24 VITALS — BP 114/78 | HR 80 | Temp 97.8°F | Ht 63.0 in | Wt 147.6 lb

## 2023-02-24 DIAGNOSIS — N1330 Unspecified hydronephrosis: Secondary | ICD-10-CM | POA: Diagnosis not present

## 2023-02-24 NOTE — Progress Notes (Signed)
    Procedures performed today:    None.  Independent interpretation of notes and tests performed by another provider:   None.  Brief History, Exam, Impression, and Recommendations:    BP 114/78 (BP Location: Right Arm, Patient Position: Sitting, Cuff Size: Normal)   Pulse 80   Temp 97.8 F (36.6 C) (Oral)   Ht 5\' 3"  (1.6 m)   Wt 147 lb 9.6 oz (67 kg)   LMP 01/07/1996 (Approximate)   SpO2 95%   BMI 26.15 kg/m   Hydronephrosis of right kidney Assessment & Plan: Noted incidentally on recent ultrasound with gastroenterologist.  Patient currently asymptomatic.  Has not had any urinary symptoms such as pain or burning with urination, no hematuria, no difficulty with voiding.  She has not had any abdominal pain, flank pain, back pain.  She does have questions today regarding imaging results. We did review imaging today.  Discussed observed right kidney hydronephrosis, discussed what this means, potential causes.  After discussion, patient agreed to proceed with referral to urology for further evaluation and recommendations.  Discussed possible consideration of additional imaging such as CT scan or possible visual inspection utilizing cystoscopy.  She has worked with Dr. Sherron Monday in the past and would like to have referral placed to his office, referral placed today  Orders: -     Ambulatory referral to Urology  Return if symptoms worsen or fail to improve.   ___________________________________________ Rhylie Stehr de Peru, MD, ABFM, CAQSM Primary Care and Sports Medicine [facility]

## 2023-02-24 NOTE — Assessment & Plan Note (Signed)
Noted incidentally on recent ultrasound with gastroenterologist.  Patient currently asymptomatic.  Has not had any urinary symptoms such as pain or burning with urination, no hematuria, no difficulty with voiding.  She has not had any abdominal pain, flank pain, back pain.  She does have questions today regarding imaging results. We did review imaging today.  Discussed observed right kidney hydronephrosis, discussed what this means, potential causes.  After discussion, patient agreed to proceed with referral to urology for further evaluation and recommendations.  Discussed possible consideration of additional imaging such as CT scan or possible visual inspection utilizing cystoscopy.  She has worked with Dr. Sherron Monday in the past and would like to have referral placed to his office, referral placed today

## 2023-02-24 NOTE — Patient Instructions (Signed)
  Medication Instructions:  Your physician recommends that you continue on your current medications as directed. Please refer to the Current Medication list given to you today. --If you need a refill on any your medications before your next appointment, please call your pharmacy first. If no refills are authorized on file call the office.--   Referrals/Procedures/Imaging: MACDIARMID, SCOTT  Urology  Follow-Up: Your next appointment:   Your physician recommends that you schedule a follow-up appointment in: as needed with Dr. de Peru  You will receive a text message or e-mail with a link to a survey about your care and experience with [place] today! We would greatly appreciate your feedback!   Thanks for letting us be apart of your health journey!!  Primary Care and Sports Medicine   Dr. Ceasar Mons Peru   We encourage you to activate your patient portal called "MyChart".  Sign up information is provided on this After Visit Summary.  MyChart is used to connect with patients for Virtual Visits (Telemedicine).  Patients are able to view lab/test results, encounter notes, upcoming appointments, etc.  Non-urgent messages can be sent to your provider as well. To learn more about what you can do with MyChart, please visit --  [email or web address].

## 2023-03-02 DIAGNOSIS — Z4731 Aftercare following explantation of shoulder joint prosthesis: Secondary | ICD-10-CM | POA: Diagnosis not present

## 2023-03-02 DIAGNOSIS — M25519 Pain in unspecified shoulder: Secondary | ICD-10-CM | POA: Diagnosis not present

## 2023-03-02 DIAGNOSIS — M2624 Reverse articulation: Secondary | ICD-10-CM | POA: Diagnosis not present

## 2023-03-05 DIAGNOSIS — Z4731 Aftercare following explantation of shoulder joint prosthesis: Secondary | ICD-10-CM | POA: Diagnosis not present

## 2023-03-05 DIAGNOSIS — M2624 Reverse articulation: Secondary | ICD-10-CM | POA: Diagnosis not present

## 2023-03-05 DIAGNOSIS — M9901 Segmental and somatic dysfunction of cervical region: Secondary | ICD-10-CM | POA: Diagnosis not present

## 2023-03-05 DIAGNOSIS — M9903 Segmental and somatic dysfunction of lumbar region: Secondary | ICD-10-CM | POA: Diagnosis not present

## 2023-03-05 DIAGNOSIS — M5442 Lumbago with sciatica, left side: Secondary | ICD-10-CM | POA: Diagnosis not present

## 2023-03-05 DIAGNOSIS — M47816 Spondylosis without myelopathy or radiculopathy, lumbar region: Secondary | ICD-10-CM | POA: Diagnosis not present

## 2023-03-05 DIAGNOSIS — M25519 Pain in unspecified shoulder: Secondary | ICD-10-CM | POA: Diagnosis not present

## 2023-03-10 DIAGNOSIS — R350 Frequency of micturition: Secondary | ICD-10-CM | POA: Diagnosis not present

## 2023-03-10 DIAGNOSIS — N130 Hydronephrosis with ureteropelvic junction obstruction: Secondary | ICD-10-CM | POA: Diagnosis not present

## 2023-03-12 DIAGNOSIS — M25519 Pain in unspecified shoulder: Secondary | ICD-10-CM | POA: Diagnosis not present

## 2023-03-12 DIAGNOSIS — Z4731 Aftercare following explantation of shoulder joint prosthesis: Secondary | ICD-10-CM | POA: Diagnosis not present

## 2023-03-12 DIAGNOSIS — M2624 Reverse articulation: Secondary | ICD-10-CM | POA: Diagnosis not present

## 2023-03-16 DIAGNOSIS — M2624 Reverse articulation: Secondary | ICD-10-CM | POA: Diagnosis not present

## 2023-03-16 DIAGNOSIS — Z4731 Aftercare following explantation of shoulder joint prosthesis: Secondary | ICD-10-CM | POA: Diagnosis not present

## 2023-03-16 DIAGNOSIS — M25519 Pain in unspecified shoulder: Secondary | ICD-10-CM | POA: Diagnosis not present

## 2023-03-16 DIAGNOSIS — Z96611 Presence of right artificial shoulder joint: Secondary | ICD-10-CM | POA: Diagnosis not present

## 2023-03-18 ENCOUNTER — Ambulatory Visit (HOSPITAL_BASED_OUTPATIENT_CLINIC_OR_DEPARTMENT_OTHER): Payer: Medicare HMO | Admitting: Family Medicine

## 2023-03-18 DIAGNOSIS — M9903 Segmental and somatic dysfunction of lumbar region: Secondary | ICD-10-CM | POA: Diagnosis not present

## 2023-03-18 DIAGNOSIS — M9901 Segmental and somatic dysfunction of cervical region: Secondary | ICD-10-CM | POA: Diagnosis not present

## 2023-03-18 DIAGNOSIS — M5442 Lumbago with sciatica, left side: Secondary | ICD-10-CM | POA: Diagnosis not present

## 2023-03-18 DIAGNOSIS — M47816 Spondylosis without myelopathy or radiculopathy, lumbar region: Secondary | ICD-10-CM | POA: Diagnosis not present

## 2023-03-23 DIAGNOSIS — M2624 Reverse articulation: Secondary | ICD-10-CM | POA: Diagnosis not present

## 2023-03-23 DIAGNOSIS — Z4731 Aftercare following explantation of shoulder joint prosthesis: Secondary | ICD-10-CM | POA: Diagnosis not present

## 2023-03-23 DIAGNOSIS — M25519 Pain in unspecified shoulder: Secondary | ICD-10-CM | POA: Diagnosis not present

## 2023-03-24 DIAGNOSIS — N1330 Unspecified hydronephrosis: Secondary | ICD-10-CM | POA: Diagnosis not present

## 2023-03-24 DIAGNOSIS — N130 Hydronephrosis with ureteropelvic junction obstruction: Secondary | ICD-10-CM | POA: Diagnosis not present

## 2023-04-01 ENCOUNTER — Other Ambulatory Visit (HOSPITAL_BASED_OUTPATIENT_CLINIC_OR_DEPARTMENT_OTHER): Payer: Self-pay | Admitting: Family Medicine

## 2023-04-01 DIAGNOSIS — M2624 Reverse articulation: Secondary | ICD-10-CM | POA: Diagnosis not present

## 2023-04-01 DIAGNOSIS — M5442 Lumbago with sciatica, left side: Secondary | ICD-10-CM | POA: Diagnosis not present

## 2023-04-01 DIAGNOSIS — M25519 Pain in unspecified shoulder: Secondary | ICD-10-CM | POA: Diagnosis not present

## 2023-04-01 DIAGNOSIS — M9901 Segmental and somatic dysfunction of cervical region: Secondary | ICD-10-CM | POA: Diagnosis not present

## 2023-04-01 DIAGNOSIS — M9903 Segmental and somatic dysfunction of lumbar region: Secondary | ICD-10-CM | POA: Diagnosis not present

## 2023-04-01 DIAGNOSIS — M47816 Spondylosis without myelopathy or radiculopathy, lumbar region: Secondary | ICD-10-CM | POA: Diagnosis not present

## 2023-04-01 DIAGNOSIS — Z4731 Aftercare following explantation of shoulder joint prosthesis: Secondary | ICD-10-CM | POA: Diagnosis not present

## 2023-04-01 MED ORDER — OMEPRAZOLE 20 MG PO CPDR: 20.0000 mg | DELAYED_RELEASE_CAPSULE | Freq: Every day | ORAL | 1 refills | Status: DC

## 2023-04-01 NOTE — Telephone Encounter (Signed)
 Copied from CRM [id number]. Topic: Clinical - Medication Refill >> Apr 01, 2023  9:09 AM [name] T wrote: Most Recent Primary Care Visit:  Provider: DE Peru, RAYMOND J  Department: [facility]  Visit Type: OFFICE VISIT  Date: 02/24/2023  Medication: omeprazole (PRILOSEC) 20 MG capsule  Has the patient contacted their pharmacy? No  Is this the correct pharmacy for this prescription? Yes  This is the patient's preferred pharmacy:  [id number] [place], [place] - [address] [place] [address] Phone: [phone] Fax: [phone]  Has the prescription been filled recently? Yes  Is the patient out of the medication? Yes  Has the patient been seen for an appointment in the last year OR does the patient have an upcoming appointment? Yes  Can we respond through MyChart? No  Agent: Please be advised that Rx refills may take up to 3 business days. We ask that you follow-up with your pharmacy.

## 2023-04-03 DIAGNOSIS — M25519 Pain in unspecified shoulder: Secondary | ICD-10-CM | POA: Diagnosis not present

## 2023-04-03 DIAGNOSIS — M2624 Reverse articulation: Secondary | ICD-10-CM | POA: Diagnosis not present

## 2023-04-03 DIAGNOSIS — Z4731 Aftercare following explantation of shoulder joint prosthesis: Secondary | ICD-10-CM | POA: Diagnosis not present

## 2023-04-06 DIAGNOSIS — Z4731 Aftercare following explantation of shoulder joint prosthesis: Secondary | ICD-10-CM | POA: Diagnosis not present

## 2023-04-06 DIAGNOSIS — M2624 Reverse articulation: Secondary | ICD-10-CM | POA: Diagnosis not present

## 2023-04-06 DIAGNOSIS — M25519 Pain in unspecified shoulder: Secondary | ICD-10-CM | POA: Diagnosis not present

## 2023-04-08 ENCOUNTER — Encounter: Payer: Self-pay | Admitting: Podiatrist

## 2023-04-08 ENCOUNTER — Ambulatory Visit: Admitting: Podiatrist

## 2023-04-08 DIAGNOSIS — L600 Ingrowing nail: Secondary | ICD-10-CM | POA: Diagnosis not present

## 2023-04-08 DIAGNOSIS — R350 Frequency of micturition: Secondary | ICD-10-CM | POA: Diagnosis not present

## 2023-04-08 DIAGNOSIS — N130 Hydronephrosis with ureteropelvic junction obstruction: Secondary | ICD-10-CM | POA: Diagnosis not present

## 2023-04-08 MED ORDER — DOXYCYCLINE HYCLATE 100 MG PO TABS: 100.0000 mg | ORAL_TABLET | Freq: Two times a day (BID) | ORAL | 0 refills | Status: AC

## 2023-04-08 MED ORDER — MUPIROCIN 2 % EX OINT: 1.0000 | TOPICAL_OINTMENT | Freq: Two times a day (BID) | CUTANEOUS | 2 refills | Status: AC

## 2023-04-08 NOTE — Progress Notes (Signed)
 Chief Complaint  Patient presents with   Ingrown Toenail    RM#2 Left big toe ingrown present for 3 weeks causing discomfort.     HPI: Patient is 73 y.o. female who presents today for discomfort in her left great toenail- medial side.  She has been having it trimmed out at her pedicures however this time she still has some soreness on this corner.  She would like the toe fixed.     Allergies  Allergen Reactions   Tramadol Other (See Comments)    Causes numbness in hands and right side of body   Bactrim [Sulfamethoxazole-Trimethoprim]     Pt unsure of reaction    Chlorhexidine Itching   Other     PT STATES SHE CAN TAKE HYDROCODONE/ACETAMINOPHEN FOR PAIN  WITH OUT ANY PROBLEMS.  STATES - IN THE PAST - OTHER PAIN MEDS CAUSED NAUSEA OR RASH OR FAST HEART BEAT - BUT SHE DOESN'T KNOW NAMES OF THOSE MEDS   STATES NO PROBLEMS WITH ANY PAIN MEDS GIVEN IN HOSPITAL.   Premarin [Conjugated Estrogens] Other (See Comments)    Headache, dizziness, and pelvic cramping.   Zithromax [Azithromycin] Swelling    Area around eyes swollen and red   Ciprofloxacin Hives, Itching and Rash    "joint pain", patient reports hives, rash, and itching   Latex Rash    HIVES - AFTER DENTIST USED LATEX GLOVES    Review of systems is negative except as noted in the HPI.  Denies nausea/ vomiting/ fevers/ chills or night sweats.   Denies difficulty breathing, denies calf pain or tenderness  Physical Exam  Patient is awake, alert, and oriented x 3.  In no acute distress.    Vascular status is intact with palpable pedal pulses DP and PT bilateral and capillary refill time less than 3 seconds bilateral.  No edema or erythema noted.   Neurological exam reveals epicritic and protective sensation grossly intact bilateral.   Dermatological exam reveals skin is supple and dry to bilateral feet.  Thickness of the nail plate of toes 2,3 left noted- no discoloration, brittleness or subungual debris noted-  appears dystrophic  vs mycotic  Left hallux nail medial border appears ingrown. It is painful with direct pressure. No redness, no drainage, no sign of infection noted.  Musculoskeletal exam: Musculature intact with dorsiflexion, plantarflexion, inversion, eversion. Ankle and First MPJ joint range of motion normal.     Assessment:   ICD-10-CM   1. Ingrown left greater toenail  L60.0        Plan: Treatment options and alternatives were discussed. Recommended a permanent removal of the medial nail border of the left hallux.   Patient agreed. Skin was prepped with alcohol and a local injection of lidocaine and Marcaine plain was infiltrated to anesthetize the toe. The toe was then prepped with Betadine and exsanguinated. The offending nail border was removed and phenol applied to the exposed matrix tissue.  The area was then cleansed well with alcohol.  Antibiotic ointment and a dressing was then applied the tourniquet released  noting a prompt hyperemic response to the tip of the toe.  Oral and written instructions were dispensed and the patient was instructed on aftercare.  If there is any increased redness, swelling, drainage, pus or any other concerns arise, Raeven will call to be seen.

## 2023-04-08 NOTE — Patient Instructions (Signed)
Soak Instructions    THE DAY AFTER THE PROCEDURE  Place 1/4 cup of epsom salts in a quart of warm tap water.  Submerge your foot or feet with outer bandage intact for the initial soak; this will allow the bandage to become moist and wet for easy lift off.  Once you remove your bandage, continue to soak in the solution for 20 minutes.  .  Next, remove your foot or feet from solution, blot dry the affected area and cover.  Apply mupirocin ointment (RX), or polysporin   You may use a band aid large enough to cover the area or use gauze and tape.    **This soak should be done twice a day for the next 5-7 days.- after that time you may switch to keeping it clean with soap and water in the shower.  You may discontinue use of the bandaid at night after about 2 weeks.  You may discontinue use of the bandaid during the day in shoes when there is no drainage on the bandaid.   IF YOUR SKIN BECOMES IRRITATED WHILE USING THESE INSTRUCTIONS, IT IS OKAY TO SWITCH TO  antibacterial soap pump soap (Dial)  and water to keep the toe clean instead of soaking in epsom salts.      Long Term Care Instructions-Post Nail Surgery  You have had your ingrown toenail and root treated with a chemical.  This chemical causes a burn that will drain and ooze like a blister.  1-2 weeks after the procedure you may leave the area open to air at night to help dry it up.  During the day,  It is important to keep this area clean and covered until the toe dries out and forms a scab. Once the scab forms you no longer need to soak or apply a dressing.  If at any time you experience an increase in pain, redness, swelling, or drainage, you should contact the office as soon as possible.  

## 2023-04-13 DIAGNOSIS — M25519 Pain in unspecified shoulder: Secondary | ICD-10-CM | POA: Diagnosis not present

## 2023-04-13 DIAGNOSIS — Z4731 Aftercare following explantation of shoulder joint prosthesis: Secondary | ICD-10-CM | POA: Diagnosis not present

## 2023-04-13 DIAGNOSIS — M2624 Reverse articulation: Secondary | ICD-10-CM | POA: Diagnosis not present

## 2023-04-16 DIAGNOSIS — M2624 Reverse articulation: Secondary | ICD-10-CM | POA: Diagnosis not present

## 2023-04-16 DIAGNOSIS — Z4731 Aftercare following explantation of shoulder joint prosthesis: Secondary | ICD-10-CM | POA: Diagnosis not present

## 2023-04-16 DIAGNOSIS — M25519 Pain in unspecified shoulder: Secondary | ICD-10-CM | POA: Diagnosis not present

## 2023-04-18 ENCOUNTER — Encounter: Payer: Self-pay | Admitting: Podiatrist

## 2023-04-20 DIAGNOSIS — M2624 Reverse articulation: Secondary | ICD-10-CM | POA: Diagnosis not present

## 2023-04-20 DIAGNOSIS — M25519 Pain in unspecified shoulder: Secondary | ICD-10-CM | POA: Diagnosis not present

## 2023-04-20 DIAGNOSIS — Z4731 Aftercare following explantation of shoulder joint prosthesis: Secondary | ICD-10-CM | POA: Diagnosis not present

## 2023-04-27 DIAGNOSIS — M2624 Reverse articulation: Secondary | ICD-10-CM | POA: Diagnosis not present

## 2023-04-27 DIAGNOSIS — M25519 Pain in unspecified shoulder: Secondary | ICD-10-CM | POA: Diagnosis not present

## 2023-04-27 DIAGNOSIS — Z4731 Aftercare following explantation of shoulder joint prosthesis: Secondary | ICD-10-CM | POA: Diagnosis not present

## 2023-04-28 DIAGNOSIS — M9901 Segmental and somatic dysfunction of cervical region: Secondary | ICD-10-CM | POA: Diagnosis not present

## 2023-04-28 DIAGNOSIS — M47816 Spondylosis without myelopathy or radiculopathy, lumbar region: Secondary | ICD-10-CM | POA: Diagnosis not present

## 2023-04-28 DIAGNOSIS — M5442 Lumbago with sciatica, left side: Secondary | ICD-10-CM | POA: Diagnosis not present

## 2023-04-28 DIAGNOSIS — M9903 Segmental and somatic dysfunction of lumbar region: Secondary | ICD-10-CM | POA: Diagnosis not present

## 2023-05-12 DIAGNOSIS — M2624 Reverse articulation: Secondary | ICD-10-CM | POA: Diagnosis not present

## 2023-05-12 DIAGNOSIS — Z4731 Aftercare following explantation of shoulder joint prosthesis: Secondary | ICD-10-CM | POA: Diagnosis not present

## 2023-05-12 DIAGNOSIS — M25519 Pain in unspecified shoulder: Secondary | ICD-10-CM | POA: Diagnosis not present

## 2023-05-19 DIAGNOSIS — M25519 Pain in unspecified shoulder: Secondary | ICD-10-CM | POA: Diagnosis not present

## 2023-05-19 DIAGNOSIS — Z4731 Aftercare following explantation of shoulder joint prosthesis: Secondary | ICD-10-CM | POA: Diagnosis not present

## 2023-05-19 DIAGNOSIS — M2624 Reverse articulation: Secondary | ICD-10-CM | POA: Diagnosis not present

## 2023-05-26 DIAGNOSIS — Z4731 Aftercare following explantation of shoulder joint prosthesis: Secondary | ICD-10-CM | POA: Diagnosis not present

## 2023-05-26 DIAGNOSIS — M25519 Pain in unspecified shoulder: Secondary | ICD-10-CM | POA: Diagnosis not present

## 2023-05-26 DIAGNOSIS — M2624 Reverse articulation: Secondary | ICD-10-CM | POA: Diagnosis not present

## 2023-06-02 DIAGNOSIS — M2624 Reverse articulation: Secondary | ICD-10-CM | POA: Diagnosis not present

## 2023-06-02 DIAGNOSIS — M25519 Pain in unspecified shoulder: Secondary | ICD-10-CM | POA: Diagnosis not present

## 2023-06-02 DIAGNOSIS — Z4731 Aftercare following explantation of shoulder joint prosthesis: Secondary | ICD-10-CM | POA: Diagnosis not present

## 2023-06-09 ENCOUNTER — Encounter (HOSPITAL_BASED_OUTPATIENT_CLINIC_OR_DEPARTMENT_OTHER): Payer: Medicare HMO

## 2023-06-09 DIAGNOSIS — M25519 Pain in unspecified shoulder: Secondary | ICD-10-CM | POA: Diagnosis not present

## 2023-06-09 DIAGNOSIS — M2624 Reverse articulation: Secondary | ICD-10-CM | POA: Diagnosis not present

## 2023-06-09 DIAGNOSIS — Z4731 Aftercare following explantation of shoulder joint prosthesis: Secondary | ICD-10-CM | POA: Diagnosis not present

## 2023-06-10 DIAGNOSIS — M5442 Lumbago with sciatica, left side: Secondary | ICD-10-CM | POA: Diagnosis not present

## 2023-06-10 DIAGNOSIS — M9901 Segmental and somatic dysfunction of cervical region: Secondary | ICD-10-CM | POA: Diagnosis not present

## 2023-06-10 DIAGNOSIS — M47816 Spondylosis without myelopathy or radiculopathy, lumbar region: Secondary | ICD-10-CM | POA: Diagnosis not present

## 2023-06-10 DIAGNOSIS — M9903 Segmental and somatic dysfunction of lumbar region: Secondary | ICD-10-CM | POA: Diagnosis not present

## 2023-06-16 DIAGNOSIS — Z1231 Encounter for screening mammogram for malignant neoplasm of breast: Secondary | ICD-10-CM | POA: Diagnosis not present

## 2023-06-16 LAB — HM MAMMOGRAPHY

## 2023-06-17 DIAGNOSIS — M2624 Reverse articulation: Secondary | ICD-10-CM | POA: Diagnosis not present

## 2023-06-17 DIAGNOSIS — Z4731 Aftercare following explantation of shoulder joint prosthesis: Secondary | ICD-10-CM | POA: Diagnosis not present

## 2023-06-17 DIAGNOSIS — M25519 Pain in unspecified shoulder: Secondary | ICD-10-CM | POA: Diagnosis not present

## 2023-06-18 ENCOUNTER — Encounter (HOSPITAL_BASED_OUTPATIENT_CLINIC_OR_DEPARTMENT_OTHER): Payer: Self-pay | Admitting: *Deleted

## 2023-06-23 DIAGNOSIS — Z4731 Aftercare following explantation of shoulder joint prosthesis: Secondary | ICD-10-CM | POA: Diagnosis not present

## 2023-06-23 DIAGNOSIS — M2624 Reverse articulation: Secondary | ICD-10-CM | POA: Diagnosis not present

## 2023-06-23 DIAGNOSIS — M25519 Pain in unspecified shoulder: Secondary | ICD-10-CM | POA: Diagnosis not present

## 2023-07-12 DIAGNOSIS — M545 Low back pain, unspecified: Secondary | ICD-10-CM | POA: Diagnosis not present

## 2023-07-12 DIAGNOSIS — R300 Dysuria: Secondary | ICD-10-CM | POA: Diagnosis not present

## 2023-07-12 DIAGNOSIS — N3000 Acute cystitis without hematuria: Secondary | ICD-10-CM | POA: Diagnosis not present

## 2023-07-14 DIAGNOSIS — M9903 Segmental and somatic dysfunction of lumbar region: Secondary | ICD-10-CM | POA: Diagnosis not present

## 2023-07-14 DIAGNOSIS — M9901 Segmental and somatic dysfunction of cervical region: Secondary | ICD-10-CM | POA: Diagnosis not present

## 2023-07-14 DIAGNOSIS — Z4731 Aftercare following explantation of shoulder joint prosthesis: Secondary | ICD-10-CM | POA: Diagnosis not present

## 2023-07-14 DIAGNOSIS — M5442 Lumbago with sciatica, left side: Secondary | ICD-10-CM | POA: Diagnosis not present

## 2023-07-14 DIAGNOSIS — M47816 Spondylosis without myelopathy or radiculopathy, lumbar region: Secondary | ICD-10-CM | POA: Diagnosis not present

## 2023-07-14 DIAGNOSIS — M25519 Pain in unspecified shoulder: Secondary | ICD-10-CM | POA: Diagnosis not present

## 2023-07-14 DIAGNOSIS — M2624 Reverse articulation: Secondary | ICD-10-CM | POA: Diagnosis not present

## 2023-07-19 DIAGNOSIS — R399 Unspecified symptoms and signs involving the genitourinary system: Secondary | ICD-10-CM | POA: Diagnosis not present

## 2023-08-03 DIAGNOSIS — M5442 Lumbago with sciatica, left side: Secondary | ICD-10-CM | POA: Diagnosis not present

## 2023-08-03 DIAGNOSIS — M9901 Segmental and somatic dysfunction of cervical region: Secondary | ICD-10-CM | POA: Diagnosis not present

## 2023-08-03 DIAGNOSIS — M9903 Segmental and somatic dysfunction of lumbar region: Secondary | ICD-10-CM | POA: Diagnosis not present

## 2023-08-03 DIAGNOSIS — M47816 Spondylosis without myelopathy or radiculopathy, lumbar region: Secondary | ICD-10-CM | POA: Diagnosis not present

## 2023-08-04 ENCOUNTER — Encounter (HOSPITAL_BASED_OUTPATIENT_CLINIC_OR_DEPARTMENT_OTHER)

## 2023-08-04 ENCOUNTER — Ambulatory Visit (HOSPITAL_BASED_OUTPATIENT_CLINIC_OR_DEPARTMENT_OTHER): Admitting: *Deleted

## 2023-08-04 ENCOUNTER — Encounter (HOSPITAL_BASED_OUTPATIENT_CLINIC_OR_DEPARTMENT_OTHER): Payer: Self-pay

## 2023-08-04 VITALS — Ht 64.0 in | Wt 147.0 lb

## 2023-08-04 DIAGNOSIS — Z0000 Encounter for general adult medical examination without abnormal findings: Secondary | ICD-10-CM | POA: Diagnosis not present

## 2023-08-04 NOTE — Patient Instructions (Signed)
 Ms. Tara Fernandez , Thank you for taking time to come for your Medicare Wellness Visit. I appreciate your ongoing commitment to your health goals. Please review the following plan we discussed and let me know if I can assist you in the future.   Screening recommendations/referrals: Colonoscopy: up to date Mammogram: up to date Bone Density: up to date Recommended yearly ophthalmology/optometry visit for glaucoma screening and checkup Recommended yearly dental visit for hygiene and checkup  Vaccinations: Influenza vaccine:  Pneumococcal vaccine:  Tdap vaccine:  Shingles vaccine:       Preventive Care 65 Years and Older, Female Preventive care refers to lifestyle choices and visits with your health care provider that can promote health and wellness. What does preventive care include? A yearly physical exam. This is also called an annual well check. Dental exams once or twice a year. Routine eye exams. Ask your health care provider how often you should have your eyes checked. Personal lifestyle choices, including: Daily care of your teeth and gums. Regular physical activity. Eating a healthy diet. Avoiding tobacco and drug use. Limiting alcohol use. Practicing safe sex. Taking low-dose aspirin  every day. Taking vitamin and mineral supplements as recommended by your health care provider. What happens during an annual well check? The services and screenings done by your health care provider during your annual well check will depend on your age, overall health, lifestyle risk factors, and family history of disease. Counseling  Your health care provider may ask you questions about your: Alcohol use. Tobacco use. Drug use. Emotional well-being. Home and relationship well-being. Sexual activity. Eating habits. History of falls. Memory and ability to understand (cognition). Work and work Astronomer. Reproductive health. Screening  You may have the following tests or  measurements: Height, weight, and BMI. Blood pressure. Lipid and cholesterol levels. These may be checked every 5 years, or more frequently if you are over 2 years old. Skin check. Lung cancer screening. You may have this screening every year starting at age 1 if you have a 30-pack-year history of smoking and currently smoke or have quit within the past 15 years. Fecal occult blood test (FOBT) of the stool. You may have this test every year starting at age 44. Flexible sigmoidoscopy or colonoscopy. You may have a sigmoidoscopy every 5 years or a colonoscopy every 10 years starting at age 34. Hepatitis C blood test. Hepatitis B blood test. Sexually transmitted disease (STD) testing. Diabetes screening. This is done by checking your blood sugar (glucose) after you have not eaten for a while (fasting). You may have this done every 1-3 years. Bone density scan. This is done to screen for osteoporosis. You may have this done starting at age 73. Mammogram. This may be done every 1-2 years. Talk to your health care provider about how often you should have regular mammograms. Talk with your health care provider about your test results, treatment options, and if necessary, the need for more tests. Vaccines  Your health care provider may recommend certain vaccines, such as: Influenza vaccine. This is recommended every year. Tetanus, diphtheria, and acellular pertussis (Tdap, Td) vaccine. You may need a Td booster every 10 years. Zoster vaccine. You may need this after age 48. Pneumococcal 13-valent conjugate (PCV13) vaccine. One dose is recommended after age 107. Pneumococcal polysaccharide (PPSV23) vaccine. One dose is recommended after age 59. Talk to your health care provider about which screenings and vaccines you need and how often you need them. This information is not intended to replace advice given  to you by your health care provider. Make sure you discuss any questions you have with your  health care provider. Document Released: 01/19/2015 Document Revised: 09/12/2015 Document Reviewed: 10/24/2014 Elsevier Interactive Patient Education  2017 [employer].  Fall Prevention in the Home Falls can cause injuries. They can happen to people of all ages. There are many things you can do to make your home safe and to help prevent falls. What can I do on the outside of my home? Regularly fix the edges of walkways and driveways and fix any cracks. Remove anything that might make you trip as you walk through a door, such as a raised step or threshold. Trim any bushes or trees on the path to your home. Use bright outdoor lighting. Clear any walking paths of anything that might make someone trip, such as rocks or tools. Regularly check to see if handrails are loose or broken. Make sure that both sides of any steps have handrails. Any raised decks and porches should have guardrails on the edges. Have any leaves, snow, or ice cleared regularly. Use sand or salt on walking paths during winter. Clean up any spills in your garage right away. This includes oil or grease spills. What can I do in the bathroom? Use night lights. Install grab bars by the toilet and in the tub and shower. Do not use towel bars as grab bars. Use non-skid mats or decals in the tub or shower. If you need to sit down in the shower, use a plastic, non-slip stool. Keep the floor dry. Clean up any water  that spills on the floor as soon as it happens. Remove soap buildup in the tub or shower regularly. Attach bath mats securely with double-sided non-slip rug tape. Do not have throw rugs and other things on the floor that can make you trip. What can I do in the bedroom? Use night lights. Make sure that you have a light by your bed that is easy to reach. Do not use any sheets or blankets that are too big for your bed. They should not hang down onto the floor. Have a firm chair that has side arms. You can use this for  support while you get dressed. Do not have throw rugs and other things on the floor that can make you trip. What can I do in the kitchen? Clean up any spills right away. Avoid walking on wet floors. Keep items that you use a lot in easy-to-reach places. If you need to reach something above you, use a strong step stool that has a grab bar. Keep electrical cords out of the way. Do not use floor polish or wax that makes floors slippery. If you must use wax, use non-skid floor wax. Do not have throw rugs and other things on the floor that can make you trip. What can I do with my stairs? Do not leave any items on the stairs. Make sure that there are handrails on both sides of the stairs and use them. Fix handrails that are broken or loose. Make sure that handrails are as long as the stairways. Check any carpeting to make sure that it is firmly attached to the stairs. Fix any carpet that is loose or worn. Avoid having throw rugs at the top or bottom of the stairs. If you do have throw rugs, attach them to the floor with carpet tape. Make sure that you have a light switch at the top of the stairs and the bottom of the  stairs. If you do not have them, ask someone to add them for you. What else can I do to help prevent falls? Wear shoes that: Do not have high heels. Have rubber bottoms. Are comfortable and fit you well. Are closed at the toe. Do not wear sandals. If you use a stepladder: Make sure that it is fully opened. Do not climb a closed stepladder. Make sure that both sides of the stepladder are locked into place. Ask someone to hold it for you, if possible. Clearly mark and make sure that you can see: Any grab bars or handrails. First and last steps. Where the edge of each step is. Use tools that help you move around (mobility aids) if they are needed. These include: Canes. Walkers. Scooters. Crutches. Turn on the lights when you go into a dark area. Replace any light bulbs as soon  as they burn out. Set up your furniture so you have a clear path. Avoid moving your furniture around. If any of your floors are uneven, fix them. If there are any pets around you, be aware of where they are. Review your medicines with your doctor. Some medicines can make you feel dizzy. This can increase your chance of falling. Ask your doctor what other things that you can do to help prevent falls. This information is not intended to replace advice given to you by your health care provider. Make sure you discuss any questions you have with your health care provider. Document Released: 10/19/2008 Document Revised: 05/31/2015 Document Reviewed: 01/27/2014 Elsevier Interactive Patient Education  2017 [employer].

## 2023-08-04 NOTE — Progress Notes (Signed)
 Subjective:   Tara Fernandez is a 73 y.o. female who presents for Medicare Annual (Subsequent) preventive examination.  Visit Complete: Virtual I connected with  Tara Fernandez on 08/04/23 by a audio enabled telemedicine application and verified that I am speaking with the correct person using two identifiers.  Patient Location: Home  Provider Location: Home Office  I discussed the limitations of evaluation and management by telemedicine. The patient expressed understanding and agreed to proceed.  Vital Signs: Because this visit was a virtual/telehealth visit, some criteria may be missing or patient reported. Any vitals not documented were not able to be obtained and vitals that have been documented are patient reported.  Patient Medicare AWV questionnaire was completed by the patient on 08-03-2023; I have confirmed that all information answered by patient is correct and no changes since this date.  Cardiac Risk Factors include: advanced age (>32men, >63 women);obesity (BMI >30kg/m2)     Objective:    Today's Vitals   08/04/23 1128  Weight: 147 lb (66.7 kg)  Height: 5' 4 (1.626 m)   Body mass index is 25.23 kg/m.     08/04/2023   11:29 AM 06/03/2022    2:04 PM 02/18/2021    5:39 PM 02/05/2021   10:26 AM 01/10/2021    1:11 PM 03/05/2015    2:00 PM 03/05/2015    6:11 AM  Advanced Directives  Does Patient Have a Medical Advance Directive? Yes Yes Yes Yes Yes No  No   Type of Estate agent of [employer] Power of [place];Living will Healthcare Power of [place];Living will Healthcare Power of [place];Living will Healthcare Power of [place];Living will    Does patient want to make changes to medical advance directive?   No - Patient declined      Copy of Healthcare Power of Attorney in Chart? Yes - validated most recent copy scanned in chart (See row information) No - copy requested No - copy requested No - copy requested     Would patient like  information on creating a medical advance directive?      No - patient declined information       Data saved with a previous flowsheet row definition    Current Medications (verified) Outpatient Encounter Medications as of 08/04/2023  Medication Sig   Ascorbic Acid (VITAMIN C) 1000 MG tablet Take 1,000 mg by mouth daily.   B Complex-C (B-COMPLEX WITH VITAMIN C) tablet Take 1 tablet by mouth daily.   Boswellia Serrata (BOSWELLIA PO) Take 1 capsule by mouth in the morning and at bedtime.   cholecalciferol  25 MCG (1000 UT) tablet 1 tablet Orally once a day if needed   doxycycline  (VIBRA -TABS) 100 MG tablet Take 1 tablet (100 mg total) by mouth 2 (two) times daily.   ELDERBERRY PO Take 1 capsule by mouth daily.   mupirocin  ointment (BACTROBAN ) 2 % Apply 1 Application topically 2 (two) times daily.   Nutritional Supplements (BEE POLLEN/ROYAL JELLY/HONEY PO) Take 1 capsule by mouth daily.   omeprazole  (PRILOSEC) 20 MG capsule Take 1 capsule (20 mg total) by mouth daily.   RED CLOVER LEAF EXTRACT PO Take 1 capsule by mouth daily.   S-Adenosylmethionine (SAM-E PO) Take 1 capsule by mouth daily.   TURMERIC-GINGER PO Take 1 capsule by mouth daily.   No facility-administered encounter medications on file as of 08/04/2023.    Allergies (verified) Tramadol, Bactrim  [sulfamethoxazole -trimethoprim ], Chlorhexidine , Other, Premarin  [conjugated estrogens ], Zithromax  [azithromycin ], Ciprofloxacin , and Latex   History: Past Medical  History:  Diagnosis Date   Arthritis    osteoarthritis; PAIN RIGHT HIP   PONV (postoperative nausea and vomiting)    Past Surgical History:  Procedure Laterality Date   ANTERIOR AND POSTERIOR REPAIR N/A 03/05/2015   Procedure: ANTERIOR (CYSTOCELE) REPAIR, VAULT PROLAPSE AND GRAFT;  Surgeon: Tara Elizabeth, MD;  Location: [facility];  Service: Urology;  Laterality: N/A;   BREAST MASS EXCISION     Hx: of left breast   CARTILAGE SURGERY  10 th grade   RIGHT KNEE    CYSTOSCOPY N/A 03/05/2015   Procedure: CYSTOSCOPY;  Surgeon: Tara Elizabeth, MD;  Location: [facility];  Service: Urology;  Laterality: N/A;   LAPAROSCOPIC VAGINAL HYSTERECTOMY WITH SALPINGECTOMY Bilateral 03/05/2015   Procedure: LAPAROSCOPIC ASSISTED VAGINAL HYSTERECTOMY WITH SALPINGECTOMY;  Surgeon: Tara GORMAN Pinal, MD;  Location: [facility];  Service: Gynecology;  Laterality: Bilateral;   right shoulder surgery  12/23/2022   TOTAL HIP ARTHROPLASTY Left 06/17/2012   Procedure: LEFT TOTAL HIP ARTHROPLASTY ANTERIOR APPROACH;  Surgeon: Tara CINDERELLA Poli, MD;  Location: [facility];  Service: Orthopedics;  Laterality: Left;   TOTAL HIP ARTHROPLASTY Right 02/11/2013   Procedure: RIGHT TOTAL HIP ARTHROPLASTY ANTERIOR APPROACH;  Surgeon: Tara CINDERELLA Poli, MD;  Location: [facility];  Service: Orthopedics;  Laterality: Right;   TOTAL KNEE ARTHROPLASTY Right 02/18/2021   Procedure: TOTAL KNEE ARTHROPLASTY;  Surgeon: Tara Cough, MD;  Location: [facility];  Service: Orthopedics;  Laterality: Right;   Family History  Problem Relation Age of Onset   Heart attack Father    Cancer Father    Tongue cancer Father    Breast cancer Sister 106   Lung cancer Sister    Cancer Sister    Lung cancer Mother    Cancer Mother    Lupus Sister    Cancer Sister    Breast cancer Sister    Healthy Brother    Cancer Maternal Aunt    Cancer Maternal Uncle    Cancer Paternal Aunt    Cancer Paternal Uncle    Healthy Brother    Diabetes Neg Hx    Hypertension Neg Hx    Thyroid  disease Neg Hx    Social History   Socioeconomic History   Marital status: Married    Spouse name: Not on file   Number of children: Not on file   Years of education: Not on file   Highest education level: Not on file  Occupational History   Not on file  Tobacco Use   Smoking status: Never   Smokeless tobacco: Never  Vaping Use   Vaping status: Never Used  Substance and Sexual Activity   Alcohol use: Not Currently    Comment: occ    Drug use: No   Sexual activity: Yes    Partners: Male    Birth control/protection: Post-menopausal, Surgical    Comment: hysterectomy  Other Topics Concern   Not on file  Social History Narrative   Not on file   Social Drivers of Health   Financial Resource Strain: Low Risk  (08/04/2023)   Overall Financial Resource Strain (CARDIA)    Difficulty of Paying Living Expenses: Not hard at all  Food Insecurity: No Food Insecurity (08/04/2023)   Hunger Vital Sign    Worried About Running Out of Food in the Last Year: Never true    Ran Out of Food in the Last Year: Never true  Transportation Needs: No Transportation Needs (08/04/2023)   PRAPARE - Transportation    Lack of Transportation (  Medical): No    Lack of Transportation (Non-Medical): No  Physical Activity: Sufficiently Active (08/04/2023)   Exercise Vital Sign    Days of Exercise per Week: 7 days    Minutes of Exercise per Session: 30 min  Stress: No Stress Concern Present (08/04/2023)   [employer] of Occupational Health - Occupational Stress Questionnaire    Feeling of Stress: Not at all  Social Connections: Socially Integrated (08/04/2023)   Social Connection and Isolation Panel    Frequency of Communication with Friends and Family: More than three times a week    Frequency of Social Gatherings with Friends and Family: More than three times a week    Attends Religious Services: More than 4 times per year    Active Member of [employer] or Organizations: Yes    Attends Engineer, structural: More than 4 times per year    Marital Status: Married    Tobacco Counseling Counseling given: Not Answered   Clinical Intake:  Pre-visit preparation completed: Yes  Pain : No/denies pain     Diabetes: No  How often do you need to have someone help you when you read instructions, pamphlets, or other written materials from your doctor or pharmacy?: 1 - Never  Interpreter Needed?: No  Information entered by :: Mliss Graff  LPN   Activities of Daily Living    08/04/2023   11:29 AM 08/03/2023    4:50 PM  In your present state of health, do you have any difficulty performing the following activities:  Hearing? 0 0  Vision? 0 0  Difficulty concentrating or making decisions? 0 0  Walking or climbing stairs? 0 0  Dressing or bathing? 0 0  Doing errands, shopping? 0 0  Preparing Food and eating ? N N  Using the Toilet? N N  In the past six months, have you accidently leaked urine? N N  Do you have problems with loss of bowel control? N N  Managing your Medications? N N  Managing your Finances? N N  Housekeeping or managing your Housekeeping? N N    Patient Care Team: [place], Quintin PARAS, MD as PCP - General (Family Medicine)  Indicate any recent Medical Services you may have received from other than [facility] providers in the past year (date may be approximate).     Assessment:   This is a routine wellness examination for [place].  Hearing/Vision screen Hearing Screening - Comments:: No trouble hearing Vision Screening - Comments:: Up to date [facility] [address]    Goals Addressed             This Visit's Progress    Weight (lb) < 200 lb (90.7 kg)         Depression Screen    08/04/2023   11:30 AM 02/24/2023    2:13 PM 09/22/2022   10:35 AM 06/09/2022   10:48 AM 06/03/2022    2:02 PM 03/11/2022   10:59 AM 02/04/2022    1:15 PM  PHQ 2/9 Scores  PHQ - 2 Score 2 0 0 0 0 0 0  PHQ- 9 Score 3 0 0   0 0  Exception Documentation    Medical reason  Medical reason Medical reason    Fall Risk    08/04/2023   11:27 AM 08/03/2023    4:50 PM 02/24/2023    2:14 PM 09/22/2022   10:34 AM 06/09/2022   10:48 AM  Fall Risk   Falls in the past year? 1 1 0 0  0  Number falls in past yr: 0  0 0 0  Injury with Fall? 0 0 0 0 0  Risk for fall due to :   No Fall Risks No Fall Risks No Fall Risks  Follow up Falls evaluation completed;Education provided;Falls prevention discussed  Falls evaluation completed;Education  provided;Falls prevention discussed Falls evaluation completed Falls evaluation completed    MEDICARE RISK AT HOME: Medicare Risk at Home Any stairs in or around the home?: Yes If so, are there any without handrails?: No Home free of loose throw rugs in walkways, pet beds, electrical cords, etc?: Yes Adequate lighting in your home to reduce risk of falls?: Yes Life alert?: No Use of a cane, walker or w/c?: No Grab bars in the bathroom?: No Shower chair or bench in shower?: No Elevated toilet seat or a handicapped toilet?: No  TIMED UP AND GO:  Was the test performed?  No    Cognitive Function:        08/04/2023   11:27 AM 06/03/2022    2:06 PM 07/26/2021   11:09 AM  6CIT Screen  What Year? 0 points 0 points 0 points  What month? 0 points 0 points   What time? 0 points 0 points 3 points  Count back from 20 0 points 0 points 0 points  Months in reverse 0 points 0 points 0 points  Repeat phrase 0 points 0 points 0 points  Total Score 0 points 0 points     Immunizations  There is no immunization history on file for this patient.  TDAP status: Due, Education has been provided regarding the importance of this vaccine. Advised may receive this vaccine at local pharmacy or Health Dept. Aware to provide a copy of the vaccination record if obtained from local pharmacy or Health Dept. Verbalized acceptance and understanding.  Flu Vaccine status: Declined, Education has been provided regarding the importance of this vaccine but patient still declined. Advised may receive this vaccine at local pharmacy or Health Dept. Aware to provide a copy of the vaccination record if obtained from local pharmacy or Health Dept. Verbalized acceptance and understanding.  Pneumococcal vaccine status: Declined,  Education has been provided regarding the importance of this vaccine but patient still declined. Advised may receive this vaccine at local pharmacy or Health Dept. Aware to provide a copy of the  vaccination record if obtained from local pharmacy or Health Dept. Verbalized acceptance and understanding.   Covid-19 vaccine status: Declined, Education has been provided regarding the importance of this vaccine but patient still declined. Advised may receive this vaccine at local pharmacy or Health Dept.or vaccine clinic. Aware to provide a copy of the vaccination record if obtained from local pharmacy or Health Dept. Verbalized acceptance and understanding.  Qualifies for Shingles Vaccine? Yes   Zostavax completed No   Shingrix Completed?: No.    Education has been provided regarding the importance of this vaccine. Patient has been advised to call insurance company to determine out of pocket expense if they have not yet received this vaccine. Advised may also receive vaccine at local pharmacy or Health Dept. Verbalized acceptance and understanding.  Screening Tests Health Maintenance  Topic Date Due   Hepatitis C Screening  Never done   DTaP/Tdap/Td (1 - Tdap) Never done   Zoster Vaccines- Shingrix (1 of 2) Never done   Pneumococcal Vaccine: 50+ Years (1 of 1 - PCV) 09/22/2023 (Originally 01/14/2001)   INFLUENZA VACCINE  08/07/2023   Medicare Annual Wellness (AWV)  08/03/2024   Fecal DNA (Cologuard)  06/12/2025   MAMMOGRAM  06/15/2025   DEXA SCAN  Completed   Hepatitis B Vaccines  Aged Out   HPV VACCINES  Aged Out   Meningococcal B Vaccine  Aged Out   COVID-19 Vaccine  Discontinued    Health Maintenance  Health Maintenance Due  Topic Date Due   Hepatitis C Screening  Never done   DTaP/Tdap/Td (1 - Tdap) Never done   Zoster Vaccines- Shingrix (1 of 2) Never done    Colorectal cancer screening: Type of screening: Cologuard. Completed 2025. Repeat every 3 years  Mammogram status: Completed  . Repeat every year  Bone Density status: Completed 202. Results reflect: Bone density results: NORMAL. Repeat every 2 years.  Lung Cancer Screening: (Low Dose CT Chest recommended if Age  52-80 years, 20 pack-year currently smoking OR have quit w/in 15years.) does not qualify.   Lung Cancer Screening Referral:   Additional Screening:  Hepatitis C Screening   never done  Vision Screening: Recommended annual ophthalmology exams for early detection of glaucoma and other disorders of the eye. Is the patient up to date with their annual eye exam?  No  Who is the provider or what is the name of the office in which the patient attends annual eye exams? [address] If pt is not established with a provider, would they like to be referred to a provider to establish care? No .   Dental Screening: Recommended annual dental exams for proper oral hygiene    Community Resource Referral / Chronic Care Management: CRR required this visit?  No   CCM required this visit?  No     Plan:     I have personally reviewed and noted the following in the patient's chart:   Medical and social history Use of alcohol, tobacco or illicit drugs  Current medications and supplements including opioid prescriptions. Patient is not currently taking opioid prescriptions. Functional ability and status Nutritional status Physical activity Advanced directives List of other physicians Hospitalizations, surgeries, and ER visits in previous 12 months Vitals Screenings to include cognitive, depression, and falls Referrals and appointments  In addition, I have reviewed and discussed with patient certain preventive protocols, quality metrics, and best practice recommendations. A written personalized care plan for preventive services as well as general preventive health recommendations were provided to patient.     Mliss Graff, LPN   [id number]   After Visit Summary: (MyChart) Due to this being a telephonic visit, the after visit summary with patients personalized plan was offered to patient via MyChart   Nurse Notes:

## 2023-08-10 DIAGNOSIS — Z4731 Aftercare following explantation of shoulder joint prosthesis: Secondary | ICD-10-CM | POA: Diagnosis not present

## 2023-08-10 DIAGNOSIS — M9901 Segmental and somatic dysfunction of cervical region: Secondary | ICD-10-CM | POA: Diagnosis not present

## 2023-08-10 DIAGNOSIS — M25519 Pain in unspecified shoulder: Secondary | ICD-10-CM | POA: Diagnosis not present

## 2023-08-10 DIAGNOSIS — M9903 Segmental and somatic dysfunction of lumbar region: Secondary | ICD-10-CM | POA: Diagnosis not present

## 2023-08-10 DIAGNOSIS — M47816 Spondylosis without myelopathy or radiculopathy, lumbar region: Secondary | ICD-10-CM | POA: Diagnosis not present

## 2023-08-10 DIAGNOSIS — M2624 Reverse articulation: Secondary | ICD-10-CM | POA: Diagnosis not present

## 2023-08-10 DIAGNOSIS — M5442 Lumbago with sciatica, left side: Secondary | ICD-10-CM | POA: Diagnosis not present

## 2023-09-02 DIAGNOSIS — M5442 Lumbago with sciatica, left side: Secondary | ICD-10-CM | POA: Diagnosis not present

## 2023-09-02 DIAGNOSIS — M47816 Spondylosis without myelopathy or radiculopathy, lumbar region: Secondary | ICD-10-CM | POA: Diagnosis not present

## 2023-09-02 DIAGNOSIS — M9903 Segmental and somatic dysfunction of lumbar region: Secondary | ICD-10-CM | POA: Diagnosis not present

## 2023-09-02 DIAGNOSIS — M9901 Segmental and somatic dysfunction of cervical region: Secondary | ICD-10-CM | POA: Diagnosis not present

## 2023-09-03 DIAGNOSIS — M2624 Reverse articulation: Secondary | ICD-10-CM | POA: Diagnosis not present

## 2023-09-03 DIAGNOSIS — Z4731 Aftercare following explantation of shoulder joint prosthesis: Secondary | ICD-10-CM | POA: Diagnosis not present

## 2023-09-03 DIAGNOSIS — M25519 Pain in unspecified shoulder: Secondary | ICD-10-CM | POA: Diagnosis not present

## 2023-09-08 DIAGNOSIS — M25519 Pain in unspecified shoulder: Secondary | ICD-10-CM | POA: Diagnosis not present

## 2023-09-08 DIAGNOSIS — M2624 Reverse articulation: Secondary | ICD-10-CM | POA: Diagnosis not present

## 2023-09-08 DIAGNOSIS — Z4731 Aftercare following explantation of shoulder joint prosthesis: Secondary | ICD-10-CM | POA: Diagnosis not present

## 2023-09-14 DIAGNOSIS — Z4731 Aftercare following explantation of shoulder joint prosthesis: Secondary | ICD-10-CM | POA: Diagnosis not present

## 2023-09-14 DIAGNOSIS — M2624 Reverse articulation: Secondary | ICD-10-CM | POA: Diagnosis not present

## 2023-09-14 DIAGNOSIS — M25519 Pain in unspecified shoulder: Secondary | ICD-10-CM | POA: Diagnosis not present

## 2023-09-23 ENCOUNTER — Other Ambulatory Visit (HOSPITAL_BASED_OUTPATIENT_CLINIC_OR_DEPARTMENT_OTHER): Payer: Self-pay | Admitting: Family Medicine

## 2023-10-08 DIAGNOSIS — M47816 Spondylosis without myelopathy or radiculopathy, lumbar region: Secondary | ICD-10-CM | POA: Diagnosis not present

## 2023-10-08 DIAGNOSIS — M5442 Lumbago with sciatica, left side: Secondary | ICD-10-CM | POA: Diagnosis not present

## 2023-10-08 DIAGNOSIS — Z4731 Aftercare following explantation of shoulder joint prosthesis: Secondary | ICD-10-CM | POA: Diagnosis not present

## 2023-10-08 DIAGNOSIS — M9903 Segmental and somatic dysfunction of lumbar region: Secondary | ICD-10-CM | POA: Diagnosis not present

## 2023-10-08 DIAGNOSIS — M2624 Reverse articulation: Secondary | ICD-10-CM | POA: Diagnosis not present

## 2023-10-08 DIAGNOSIS — M25519 Pain in unspecified shoulder: Secondary | ICD-10-CM | POA: Diagnosis not present

## 2023-10-08 DIAGNOSIS — M9901 Segmental and somatic dysfunction of cervical region: Secondary | ICD-10-CM | POA: Diagnosis not present

## 2023-11-02 DIAGNOSIS — E039 Hypothyroidism, unspecified: Secondary | ICD-10-CM | POA: Diagnosis not present

## 2023-11-02 DIAGNOSIS — E041 Nontoxic single thyroid nodule: Secondary | ICD-10-CM | POA: Diagnosis not present

## 2023-11-09 ENCOUNTER — Ambulatory Visit: Admitting: Podiatry

## 2023-11-09 ENCOUNTER — Encounter: Payer: Self-pay | Admitting: Podiatry

## 2023-11-09 DIAGNOSIS — B353 Tinea pedis: Secondary | ICD-10-CM

## 2023-11-09 DIAGNOSIS — B351 Tinea unguium: Secondary | ICD-10-CM | POA: Diagnosis not present

## 2023-11-09 DIAGNOSIS — M79675 Pain in left toe(s): Secondary | ICD-10-CM | POA: Diagnosis not present

## 2023-11-09 MED ORDER — CLOTRIMAZOLE-BETAMETHASONE 1-0.05 % EX CREA: 1.0000 | TOPICAL_CREAM | Freq: Every day | CUTANEOUS | 2 refills | Status: AC

## 2023-11-09 NOTE — Progress Notes (Unsigned)
 Subjective:  Patient ID: Tara Fernandez, female    DOB: 04/02/1950,  MRN: [id number]  Tara Fernandez presents to clinic today for:  Chief Complaint  Patient presents with   Nail Problem    Left foot 3rd toe nail thick x 3 months. Mupirocin  and after bite for treatment.  Left foot sulcus area painful. 8 pain with pressure. Possible ingrown L1 medial border.   Patient presents with concern of a thickened, sore, left third toenail.  States that it is constantly an area of pressure and discomfort when wearing shoes.  She does get pedicures and the technician does try to cut this back to give her relief.  She also gets a corn at the distal tip of the toe.  No signs of ulceration are noted.  She also gets some itching on the top of the left third toe.  Show use mupirocin  ointment on the area, or some type of anesthetic bite relief ointment as needed.  Also notes pain in the ball of the foot near the 2nd and 3rd toe sulcus when she points with her finger.  She does acknowledge that she has a bunion and hammertoes.  She is wondering if wearing a toe spacer will help.  Allergies  Allergen Reactions   Tramadol Other (See Comments)    Causes numbness in hands and right side of body   Bactrim  [Sulfamethoxazole -Trimethoprim ]     Pt unsure of reaction    Chlorhexidine  Itching   Other     PT STATES SHE CAN TAKE HYDROCODONE /ACETAMINOPHEN  FOR PAIN  WITH OUT ANY PROBLEMS.  STATES - IN THE PAST - OTHER PAIN MEDS CAUSED NAUSEA OR RASH OR FAST HEART BEAT - BUT SHE DOESN'T KNOW NAMES OF THOSE MEDS   STATES NO PROBLEMS WITH ANY PAIN MEDS GIVEN IN HOSPITAL.   Premarin  [Conjugated Estrogens ] Other (See Comments)    Headache, dizziness, and pelvic cramping.   Zithromax  [Azithromycin ] Swelling    Area around eyes swollen and red   Ciprofloxacin  Hives, Itching and Rash    joint pain, patient reports hives, rash, and itching   Latex Rash    HIVES - AFTER DENTIST USED LATEX GLOVES    Objective:   Tara Fernandez is a pleasant 73 y.o. female in NAD. AAO x 3.  Vascular Examination: Capillary refill time is 3-5 seconds to toes bilateral. Palpable pedal pulses b/l LE. Digital hair present b/l. No pedal edema b/l. Skin temperature gradient WNL b/l. No varicosities b/l. No cyanosis or clubbing noted b/l.   Dermatological Examination: There is 4 mm thickening of the left third toenail.  It is dystrophic and discolored.  There is pain with compression of the nail.  There is a distal corn on the left third toe without erythema or ulceration.  There is mild eczematous changes on the dorsal aspect the left third toe near the DIPJ.  No blister formation is noted.  Neurological Examination: Epicritic sensation is intact to the toes.  Orthopedic examination: There is a bony prominence on the medial aspect of the left first metatarsal head with lateral angulation of the hallux.  This is semireducible with manipulation.  There is contracture of the left second toe at the PIP joint.  Some elevation of the left third toe at the MP joint.  There is pain at the 2nd and 3rd met head/sulcus area.  No plantar lesions are noted.  Assessment/Plan: 1. Fungal nail infection   2. Tinea pedis of left  foot     Meds ordered this encounter  Medications   clotrimazole -betamethasone  (LOTRISONE ) cream    Sig: Apply 1 Application topically daily. Apply to skin on top of left 3rd toe until itching resolves.    Dispense:  30 g    Refill:  2    Discussed patient's condition today.  After obtaining patient consent, the left third toe was anesthetized with a 50:50 mixture of 1% lidocaine  plain and 0.5% bupivacaine  plain for a total of 3cc's administered.  Upon confirmation of anesthesia, a freer elevator was utilized to free the left third toenail from the nail bed.  The nail was then avulsed proximal to the eponychium and removed in toto.  It was sent to [facility] labs for fungal nail culture.    The area was inspected  for any remaining spicules.  Antibiotic ointment and a DSD were applied, followed by a Coban dressing.  Patient tolerated the anesthetic and procedure well and will f/u in 2-3 weeks for recheck.  Patient given post-procedure instructions for daily 15-minute Epsom salt soaks, antibiotic ointment and daily use of Bandaids until toe starts to dry / form eschar.   Send in a prescription for Lotrisone  cream to apply to the dorsal aspect of the third toe once the area has healed from the toenail avulsion.  This is for the dermatitis/eczema on the dorsal aspect of the toe.   Return in about 2 weeks (around 11/23/2023) for nail avulsion recheck L-3 nail.   Awanda CHARM Imperial, DPM, FACFAS [facility]     [address] [place], [place] [address]                Office [phone]  Fax [phone]

## 2023-11-11 DIAGNOSIS — M9901 Segmental and somatic dysfunction of cervical region: Secondary | ICD-10-CM | POA: Diagnosis not present

## 2023-11-11 DIAGNOSIS — M546 Pain in thoracic spine: Secondary | ICD-10-CM | POA: Diagnosis not present

## 2023-11-11 DIAGNOSIS — M5442 Lumbago with sciatica, left side: Secondary | ICD-10-CM | POA: Diagnosis not present

## 2023-11-11 DIAGNOSIS — M542 Cervicalgia: Secondary | ICD-10-CM | POA: Diagnosis not present

## 2023-11-11 DIAGNOSIS — M9902 Segmental and somatic dysfunction of thoracic region: Secondary | ICD-10-CM | POA: Diagnosis not present

## 2023-11-11 DIAGNOSIS — M9903 Segmental and somatic dysfunction of lumbar region: Secondary | ICD-10-CM | POA: Diagnosis not present

## 2023-11-11 DIAGNOSIS — M47816 Spondylosis without myelopathy or radiculopathy, lumbar region: Secondary | ICD-10-CM | POA: Diagnosis not present

## 2023-11-20 ENCOUNTER — Other Ambulatory Visit: Payer: Self-pay | Admitting: Podiatry

## 2023-11-23 ENCOUNTER — Ambulatory Visit: Admitting: Podiatry

## 2023-11-23 ENCOUNTER — Encounter: Payer: Self-pay | Admitting: Podiatry

## 2023-11-23 DIAGNOSIS — L820 Inflamed seborrheic keratosis: Secondary | ICD-10-CM | POA: Diagnosis not present

## 2023-11-23 DIAGNOSIS — Z789 Other specified health status: Secondary | ICD-10-CM | POA: Diagnosis not present

## 2023-11-23 DIAGNOSIS — B351 Tinea unguium: Secondary | ICD-10-CM | POA: Diagnosis not present

## 2023-11-23 DIAGNOSIS — I788 Other diseases of capillaries: Secondary | ICD-10-CM | POA: Diagnosis not present

## 2023-11-23 DIAGNOSIS — L538 Other specified erythematous conditions: Secondary | ICD-10-CM | POA: Diagnosis not present

## 2023-11-23 MED ORDER — CICLOPIROX 8 % EX SOLN
Freq: Every day | CUTANEOUS | 11 refills | Status: AC
Start: 2023-11-23 — End: ?

## 2023-11-23 NOTE — Progress Notes (Signed)
       Subjective:  Patient ID: Tara Fernandez, female    DOB: 12/29/1950,  MRN: [id number]  Chief Complaint  Patient presents with   Wound Check    2wk f/u nail avulsion recheck L-3 nail. 0 pain. Non diabetic.    Tara Fernandez presents to clinic today for f/u of avulsion of the left third toenail secondary to pain and fungus.  This was also sent to the lab for fungal nail culture.  Did come back positive for dermatophyte involvement.  PCP is [place], Quintin PARAS, MD.  Allergies  Allergen Reactions   Tramadol Other (See Comments)    Causes numbness in hands and right side of body   Bactrim  [Sulfamethoxazole -Trimethoprim ]     Pt unsure of reaction    Chlorhexidine  Itching   Other     PT STATES SHE CAN TAKE HYDROCODONE /ACETAMINOPHEN  FOR PAIN  WITH OUT ANY PROBLEMS.  STATES - IN THE PAST - OTHER PAIN MEDS CAUSED NAUSEA OR RASH OR FAST HEART BEAT - BUT SHE DOESN'T KNOW NAMES OF THOSE MEDS   STATES NO PROBLEMS WITH ANY PAIN MEDS GIVEN IN HOSPITAL.   Premarin  [Conjugated Estrogens ] Other (See Comments)    Headache, dizziness, and pelvic cramping.   Zithromax  [Azithromycin ] Swelling    Area around eyes swollen and red   Ciprofloxacin  Hives, Itching and Rash    joint pain, patient reports hives, rash, and itching   Latex Rash    HIVES - AFTER DENTIST USED LATEX GLOVES    Objective:  Vascular Examination: Capillary refill time is 3-5 seconds to toes bilateral. Palpable pedal pulses b/l LE. Digital hair present b/l. No pedal edema b/l. Skin temperature gradient WNL b/l. No varicosities b/l. No cyanosis or clubbing noted b/l.   Dermatological Examination: Upon inspection of the nail avulsion site, there are no clinical signs of infection.  No purulence, no necrosis, no malodor present.  Minimal to no erythema present.  Eschar formed along nail margin.  Minimal to no pain on palpation of area.   Assessment/Plan: 1. Fungal nail infection     Meds ordered this encounter   Medications   ciclopirox (PENLAC) 8 % solution    Sig: Apply topically at bedtime. Apply thin layer over nail. Apply daily over previous coat. Remove weekly with polish remover.    Dispense:  6.6 mL    Refill:  11   Reviewed fungal nail culture results with the patient.  Will get her started with topical ciclopirox.  We had reviewed oral options as well but due to possible risks and side effect she would like to start with the oral and may change her mind later.  She is aware she will need to get a blood test prior to starting any oral medication.  I will recheck in 3 months.  Return in about 3 months (around 02/23/2024) for fungal nail recheck.   Awanda CHARM Imperial, DPM, FACFAS [facility]     [address] [place], [place] [address]                Office [phone]  Fax [phone]

## 2023-11-26 ENCOUNTER — Ambulatory Visit: Payer: Self-pay | Admitting: Podiatry

## 2023-11-26 DIAGNOSIS — M47816 Spondylosis without myelopathy or radiculopathy, lumbar region: Secondary | ICD-10-CM | POA: Diagnosis not present

## 2023-11-26 DIAGNOSIS — M542 Cervicalgia: Secondary | ICD-10-CM | POA: Diagnosis not present

## 2023-11-26 DIAGNOSIS — M546 Pain in thoracic spine: Secondary | ICD-10-CM | POA: Diagnosis not present

## 2023-11-26 DIAGNOSIS — M9902 Segmental and somatic dysfunction of thoracic region: Secondary | ICD-10-CM | POA: Diagnosis not present

## 2023-11-26 DIAGNOSIS — M9903 Segmental and somatic dysfunction of lumbar region: Secondary | ICD-10-CM | POA: Diagnosis not present

## 2023-11-26 DIAGNOSIS — M5442 Lumbago with sciatica, left side: Secondary | ICD-10-CM | POA: Diagnosis not present

## 2023-11-26 DIAGNOSIS — M9901 Segmental and somatic dysfunction of cervical region: Secondary | ICD-10-CM | POA: Diagnosis not present

## 2023-11-30 NOTE — Telephone Encounter (Signed)
 Patient called in and has been scheduled for 10:15 am on 12/08/23 in [place]

## 2023-12-08 ENCOUNTER — Ambulatory Visit: Admitting: Podiatry

## 2023-12-08 ENCOUNTER — Ambulatory Visit (INDEPENDENT_AMBULATORY_CARE_PROVIDER_SITE_OTHER)

## 2023-12-08 DIAGNOSIS — G5762 Lesion of plantar nerve, left lower limb: Secondary | ICD-10-CM

## 2023-12-08 DIAGNOSIS — M2042 Other hammer toe(s) (acquired), left foot: Secondary | ICD-10-CM

## 2023-12-08 DIAGNOSIS — M21612 Bunion of left foot: Secondary | ICD-10-CM | POA: Diagnosis not present

## 2023-12-08 NOTE — Progress Notes (Unsigned)
 Chief Complaint  Patient presents with   Injections    Requesting injection. Left IMS 2 . 8 pain. Not diabetic. Takes Advil  and slppers.   Discussed the use of AI scribe software for clinical note transcription with the patient, who gave verbal consent to proceed.  History of Present Illness Tara Fernandez is a 73 year old female who presents with pain between the left 2nd and 3rd toes and requests a cortisone injection.  She experiences persistent pain localized between the 2nd and 3rd metatarsal bones.  She is considering a cortisone injection to alleviate the pain.  She recalls a previous cortisone injection that resulted in a scab, which took a month to heal. The injection site was numbed, and she experienced numbness for a couple of hours post-injection. After a toenail removal, she had no pain for a week.  She discusses the use of gel spacers to help splay her toes and relieve pressure on the nerve.  She mentions her mother, who was terrified of medical procedures, and reflects on her own experiences with patience and care during treatments.     Past Medical History:  Diagnosis Date   Arthritis    osteoarthritis; PAIN RIGHT HIP   PONV (postoperative nausea and vomiting)    Past Surgical History:  Procedure Laterality Date   ANTERIOR AND POSTERIOR REPAIR N/A 03/05/2015   Procedure: ANTERIOR (CYSTOCELE) REPAIR, VAULT PROLAPSE AND GRAFT;  Surgeon: Glendia Elizabeth, MD;  Location: [facility];  Service: Urology;  Laterality: N/A;   BREAST MASS EXCISION     Hx: of left breast   CARTILAGE SURGERY  10 th grade   RIGHT KNEE   CYSTOSCOPY N/A 03/05/2015   Procedure: CYSTOSCOPY;  Surgeon: Glendia Elizabeth, MD;  Location: [facility];  Service: Urology;  Laterality: N/A;   LAPAROSCOPIC VAGINAL HYSTERECTOMY WITH SALPINGECTOMY Bilateral 03/05/2015   Procedure: LAPAROSCOPIC ASSISTED VAGINAL HYSTERECTOMY WITH SALPINGECTOMY;  Surgeon: Ronal GORMAN Pinal, MD;  Location: [facility];  Service:  Gynecology;  Laterality: Bilateral;   right shoulder surgery  12/23/2022   TOTAL HIP ARTHROPLASTY Left 06/17/2012   Procedure: LEFT TOTAL HIP ARTHROPLASTY ANTERIOR APPROACH;  Surgeon: Lonni CINDERELLA Poli, MD;  Location: [facility];  Service: Orthopedics;  Laterality: Left;   TOTAL HIP ARTHROPLASTY Right 02/11/2013   Procedure: RIGHT TOTAL HIP ARTHROPLASTY ANTERIOR APPROACH;  Surgeon: Lonni CINDERELLA Poli, MD;  Location: [facility];  Service: Orthopedics;  Laterality: Right;   TOTAL KNEE ARTHROPLASTY Right 02/18/2021   Procedure: TOTAL KNEE ARTHROPLASTY;  Surgeon: Ernie Cough, MD;  Location: [facility];  Service: Orthopedics;  Laterality: Right;   Allergies  Allergen Reactions   Tramadol Other (See Comments)    Causes numbness in hands and right side of body   Bactrim  [Sulfamethoxazole -Trimethoprim ]     Pt unsure of reaction    Chlorhexidine  Itching   Other     PT STATES SHE CAN TAKE HYDROCODONE /ACETAMINOPHEN  FOR PAIN  WITH OUT ANY PROBLEMS.  STATES - IN THE PAST - OTHER PAIN MEDS CAUSED NAUSEA OR RASH OR FAST HEART BEAT - BUT SHE DOESN'T KNOW NAMES OF THOSE MEDS   STATES NO PROBLEMS WITH ANY PAIN MEDS GIVEN IN HOSPITAL.   Premarin  [Conjugated Estrogens ] Other (See Comments)    Headache, dizziness, and pelvic cramping.   Zithromax  [Azithromycin ] Swelling    Area around eyes swollen and red   Ciprofloxacin  Hives, Itching and Rash    joint pain, patient reports hives, rash, and itching   Latex Rash    HIVES -  AFTER DENTIST USED LATEX GLOVES   Physical Exam Palpable pedal pulses.  No open lesions are noted.  No ecchymosis or erythema is appreciated.  There is pain with compression of the left second interspace.  Positive Mulder sign noted.  There is lateral angulation of the hallux with bunion deformity present.  Epicritic sensation intact.  No metatarsal pain on palpation.  Radiographs of the left foot, 3 views, today revealed high first IM angle with tib sesamoid position of 6.  Uneven jt.  Space narrowing at first MPJ with lateral partial subluxation of joint due to high hallux abductus angle.  Contracture 2nd toe at PIPJ.  Medial angulation of lesser toes at MPJ's.   Assessment/Plan of Care: 1. Morton's neuroma of left foot   2. Hammertoe of left foot   3. Bunion, left foot     Assessment & Plan Morton's neuroma of left foot Chronic Morton's neuroma in the left foot causing discomfort and pain, exacerbated by tight footwear and relieved by toe separation. Cortisone injection administered to reduce inflammation and swelling of the nerve, with expected relief for up to a month. - Administered cortisone injection to the left second interspace.  This consisted of a mixture of 1% lidocaine  plain, 0.5% Marcaine  plain, and Kenalog  10 for total of 1.5 cc administered.  Band-Aid was applied and she tolerated this well. - Recommended use of gel toe spacers at night to separate toes and reduce nerve compression. - Advised against tight footwear to prevent aggravation of symptoms. - Discussed potential for numbness post-injection, lasting up to a couple of hours. - Explained that cortisone remains in the area for up to a month, aiming to shrink the swollen nerve and alleviate symptoms. - Her bunion/HAV deformity and hammertoe are aggravating factors and the toe spacers may be helpful since she does not want to proceed with surgery. These are separate issues, but can aggravate the neuroma symptoms by contributing to poor position of the foot.  Follow-up as needed    Awanda CHARM Imperial, DPM, FACFAS [facility]     [address] [place], [place] [address]                Office [phone]  Fax [phone]

## 2023-12-14 ENCOUNTER — Encounter: Payer: Self-pay | Admitting: Podiatry

## 2023-12-14 ENCOUNTER — Ambulatory Visit: Admitting: Podiatry

## 2023-12-14 DIAGNOSIS — L600 Ingrowing nail: Secondary | ICD-10-CM | POA: Diagnosis not present

## 2023-12-14 DIAGNOSIS — M21612 Bunion of left foot: Secondary | ICD-10-CM | POA: Diagnosis not present

## 2023-12-14 DIAGNOSIS — M2012 Hallux valgus (acquired), left foot: Secondary | ICD-10-CM | POA: Diagnosis not present

## 2023-12-14 NOTE — Progress Notes (Signed)
 Subjective:  Patient ID: Tara Fernandez, female    DOB: 07/01/1950,  MRN: [id number]  [name] presents to clinic today for:  Chief Complaint  Patient presents with   Ingrown Toenail    Left great toenail medial border. 8 pain x 4 weeks. Non diabetic.    Patient presents with chronic ingrowing toenail to the left hallux medial nail border.  Denies drainage but states the entire margin is painful.  Typically her pedicurist can cut this back but states it has been too painful.  She also wants her x-rays reviewed again, that were taken on the last visit, to see the the fifth metatarsal base area where she expressed that there was a previous fracture in the past.  She wants to know if the fracture can still be seen and if there is a lot of arthritis in that area.  Allergies  Allergen Reactions   Tramadol Other (See Comments)    Causes numbness in hands and right side of body   Bactrim  [Sulfamethoxazole -Trimethoprim ]     Pt unsure of reaction    Chlorhexidine  Itching   Other     PT STATES SHE CAN TAKE HYDROCODONE /ACETAMINOPHEN  FOR PAIN  WITH OUT ANY PROBLEMS.  STATES - IN THE PAST - OTHER PAIN MEDS CAUSED NAUSEA OR RASH OR FAST HEART BEAT - BUT SHE DOESN'T KNOW NAMES OF THOSE MEDS   STATES NO PROBLEMS WITH ANY PAIN MEDS GIVEN IN HOSPITAL.   Premarin  [Conjugated Estrogens ] Other (See Comments)    Headache, dizziness, and pelvic cramping.   Zithromax  [Azithromycin ] Swelling    Area around eyes swollen and red   Ciprofloxacin  Hives, Itching and Rash    joint pain, patient reports hives, rash, and itching   Latex Rash    HIVES - AFTER DENTIST USED LATEX GLOVES    Objective:  Tara Fernandez is a pleasant 73 y.o. female in NAD. AAO x 3.  Vascular Examination: Capillary refill time is 3-5 seconds to toes bilateral. Palpable pedal pulses b/l LE. Digital hair present b/l. No pedal edema b/l. Skin temperature gradient WNL b/l. No varicosities b/l. No cyanosis or clubbing  noted b/l.   Dermatological Examination: There is incurvation of the left hallux medial nail border.  There is pain on palpation of the affected nail border.  No active drainage is noted.  No erythema is appreciated.  There is significant hallux valgus to that toe which is putting more pressure along the medial nail margin.  Orthopedic examination: Significant bunion prominence with hallux valgus noted of the left foot.  This is not fully reducible with manipulation.  There is medial angulation of the lesser toes at the MPJ level.  There is no pain on palpation to the fifth metatarsal base.  No erythema or ecchymosis is noted in this location.  Assessment/Plan: 1. Ingrown toenail   2. Hallux valgus with bunions, left    Discussed patient's condition today.  After obtaining patient consent, the left hallux was anesthetized with a 50:50 mixture of 1% lidocaine  plain and 0.5% bupivacaine  plain for a total of 3cc's administered.  Upon confirmation of anesthesia, a freer elevator was utilized to free the left hallux medial nail border from the nail bed.  The nail border was then avulsed proximal to the eponychium and removed in toto.  The area was inspected for any remaining spicules.  A chemical matrixectomy was performed with NaOH and neutralized with acetic acid solution.  Antibiotic ointment and a DSD were applied,  followed by a Coban dressing.  Patient tolerated the anesthetic and procedure well and will f/u in 2-3 weeks for recheck.  Patient given post-procedure instructions for daily 15-minute Epsom salt soaks, antibiotic ointment and daily use of Bandaids until toe starts to dry / form eschar.   Patient's x-rays from last visit were reviewed again.  She wanted to have the left fifth metatarsal base evaluated from where she had a previous fracture.  She wanted to know if you could tell where the fracture was on x-ray, which one cannot.  No significant arthritic changes were noted in the area of the  fifth metatarsal base.  She appreciated the review of x-rays today.  Follow-up 2 weeks for PNA recheck of the left hallux medial nail border   Waylen Depaolo DSABRA Imperial, DPM, FACFAS [facility]     [address] [place], [place] [address]                Office [phone]  Fax [phone]

## 2023-12-14 NOTE — Patient Instructions (Signed)

## 2023-12-21 ENCOUNTER — Ambulatory Visit: Admitting: Podiatry

## 2023-12-28 ENCOUNTER — Other Ambulatory Visit: Payer: Self-pay | Admitting: Endocrinology

## 2023-12-28 ENCOUNTER — Ambulatory Visit: Admitting: Podiatry

## 2023-12-28 DIAGNOSIS — L600 Ingrowing nail: Secondary | ICD-10-CM | POA: Diagnosis not present

## 2023-12-28 DIAGNOSIS — E041 Nontoxic single thyroid nodule: Secondary | ICD-10-CM

## 2023-12-28 NOTE — Progress Notes (Signed)
" °   °  °  Subjective:  Patient ID: Tara Fernandez, female    DOB: 11-01-1950,  MRN: [id number]  Chief Complaint  Patient presents with   PNA check    Checking the left hallux after PNA. Looking good. Scabb formed little redness noted.  Not diabetic, no anti coag    Tara Fernandez presents to clinic today for f/u of PNA to the left hallux medial nail border.  Also, at the visit prior to her PNA procedure, on 12/08/2023, she received a cortisone injection of the left second interspace for possible Morton's neuroma.  She states it only helped for 2 days and she still has pain.  She has significant hallux valgus with hammertoes on the left foot.  PCP is [place], Tara PARAS, MD.  Allergies[1]  Objective:  Vascular Examination: Capillary refill time is 3-5 seconds to toes bilateral. Palpable pedal pulses b/l LE. Digital hair present b/l. No pedal edema b/l. Skin temperature gradient WNL b/l. No varicosities b/l. No cyanosis or clubbing noted b/l.   Dermatological Examination: Upon inspection of the PNA site, there are no clinical signs of infection.  No purulence, no necrosis, no malodor present.  Minimal to no erythema present.  Eschar formed along nail margin.  Minimal to no pain on palpation of area.   Assessment/Plan: 1. Ingrown toenail     Iodine  solution followed by Band-Aid was applied to the medial border of the left hallux.  She was encouraged to purchase some iodine  at the store and apply to that margin once daily.  She can leave it open at night to start encouraging drier scab to form.  Inform the patient that she is almost in the clear but still has few more days of treatment and then she should be fine.  She will follow-up with Dr. Lamount in February regarding the left neuroma pain/hammertoe/Hallux valgus concerns.  Return if symptoms worsen or fail to improve.   Tara Fernandez, DPM, FACFAS [facility]     [address], [place] [address]                Office [phone]  Fax [phone]     [1]  Allergies Allergen Reactions   Tramadol Other (See Comments)    Causes numbness in hands and right side of body   Bactrim  [Sulfamethoxazole -Trimethoprim ]     Pt unsure of reaction    Chlorhexidine  Itching   Other     PT STATES SHE CAN TAKE HYDROCODONE /ACETAMINOPHEN  FOR PAIN  WITH OUT ANY PROBLEMS.  STATES - IN THE PAST - OTHER PAIN MEDS CAUSED NAUSEA OR RASH OR FAST HEART BEAT - BUT SHE DOESN'T KNOW NAMES OF THOSE MEDS   STATES NO PROBLEMS WITH ANY PAIN MEDS GIVEN IN HOSPITAL.   Premarin  [Conjugated Estrogens ] Other (See Comments)    Headache, dizziness, and pelvic cramping.   Zithromax  [Azithromycin ] Swelling    Area around eyes swollen and red   Ciprofloxacin  Hives, Itching and Rash    joint pain, patient reports hives, rash, and itching   Latex Rash    HIVES - AFTER DENTIST USED LATEX GLOVES   "

## 2024-01-18 ENCOUNTER — Ambulatory Visit
Admission: RE | Admit: 2024-01-18 | Discharge: 2024-01-18 | Disposition: A | Source: Ambulatory Visit | Attending: Endocrinology | Admitting: Endocrinology

## 2024-01-18 DIAGNOSIS — E041 Nontoxic single thyroid nodule: Secondary | ICD-10-CM

## 2024-02-12 ENCOUNTER — Ambulatory Visit: Admitting: Podiatry

## 2024-02-22 ENCOUNTER — Ambulatory Visit: Admitting: Podiatry

## 2024-02-29 ENCOUNTER — Ambulatory Visit: Admitting: Podiatry

## 2024-03-01 ENCOUNTER — Ambulatory Visit: Admitting: Podiatry

## 2024-08-30 ENCOUNTER — Encounter (HOSPITAL_BASED_OUTPATIENT_CLINIC_OR_DEPARTMENT_OTHER)
